# Patient Record
Sex: Female | Born: 1935 | Race: White | Hispanic: No | Marital: Single | Smoking: Never smoker
Health system: Southern US, Community
[De-identification: ages and names within clinical notes are randomized; demographics above are authoritative.]

## PROBLEM LIST (undated history)

## (undated) DIAGNOSIS — C50912 Malignant neoplasm of unspecified site of left female breast: Secondary | ICD-10-CM

## (undated) DIAGNOSIS — I890 Lymphedema, not elsewhere classified: Secondary | ICD-10-CM

## (undated) DIAGNOSIS — Z923 Personal history of irradiation: Secondary | ICD-10-CM

## (undated) DIAGNOSIS — G4730 Sleep apnea, unspecified: Secondary | ICD-10-CM

## (undated) DIAGNOSIS — F419 Anxiety disorder, unspecified: Secondary | ICD-10-CM

## (undated) DIAGNOSIS — C50919 Malignant neoplasm of unspecified site of unspecified female breast: Secondary | ICD-10-CM

## (undated) DIAGNOSIS — M1990 Unspecified osteoarthritis, unspecified site: Secondary | ICD-10-CM

## (undated) DIAGNOSIS — Z9221 Personal history of antineoplastic chemotherapy: Secondary | ICD-10-CM

## (undated) HISTORY — DX: Sleep apnea, unspecified: G47.30

## (undated) HISTORY — DX: Anxiety disorder, unspecified: F41.9

## (undated) HISTORY — DX: Lymphedema, not elsewhere classified: I89.0

## (undated) HISTORY — DX: Malignant neoplasm of unspecified site of unspecified female breast: C50.919

## (undated) HISTORY — PX: EYE SURGERY: SHX253

## (undated) HISTORY — DX: Unspecified osteoarthritis, unspecified site: M19.90

## (undated) HISTORY — DX: Malignant neoplasm of unspecified site of left female breast: C50.912

---
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
## 1978-01-05 HISTORY — PX: BREAST BIOPSY: SHX20

## 2000-01-06 DIAGNOSIS — Z9221 Personal history of antineoplastic chemotherapy: Secondary | ICD-10-CM

## 2000-01-06 DIAGNOSIS — Z923 Personal history of irradiation: Secondary | ICD-10-CM

## 2000-01-06 DIAGNOSIS — C50919 Malignant neoplasm of unspecified site of unspecified female breast: Secondary | ICD-10-CM

## 2000-01-06 HISTORY — PX: MASTECTOMY: SHX3

## 2000-01-06 HISTORY — DX: Personal history of antineoplastic chemotherapy: Z92.21

## 2000-01-06 HISTORY — DX: Personal history of irradiation: Z92.3

## 2000-01-06 HISTORY — DX: Malignant neoplasm of unspecified site of unspecified female breast: C50.919

## 2003-10-10 ENCOUNTER — Ambulatory Visit: Payer: Self-pay | Admitting: Internal Medicine

## 2003-11-22 ENCOUNTER — Ambulatory Visit: Payer: Self-pay | Admitting: Oncology

## 2003-12-06 ENCOUNTER — Ambulatory Visit: Payer: Self-pay | Admitting: Oncology

## 2004-01-24 ENCOUNTER — Ambulatory Visit: Payer: Self-pay | Admitting: Radiation Oncology

## 2004-02-29 ENCOUNTER — Ambulatory Visit: Payer: Self-pay | Admitting: Oncology

## 2004-03-05 ENCOUNTER — Ambulatory Visit: Payer: Self-pay | Admitting: Oncology

## 2004-04-21 ENCOUNTER — Ambulatory Visit: Payer: Self-pay | Admitting: Internal Medicine

## 2004-05-05 ENCOUNTER — Ambulatory Visit: Payer: Self-pay | Admitting: Internal Medicine

## 2004-06-05 ENCOUNTER — Ambulatory Visit: Payer: Self-pay | Admitting: Internal Medicine

## 2004-06-12 ENCOUNTER — Ambulatory Visit: Payer: Self-pay | Admitting: Oncology

## 2004-07-05 ENCOUNTER — Ambulatory Visit: Payer: Self-pay | Admitting: Internal Medicine

## 2004-07-05 ENCOUNTER — Ambulatory Visit: Payer: Self-pay | Admitting: Oncology

## 2004-08-25 ENCOUNTER — Ambulatory Visit: Payer: Self-pay | Admitting: General Surgery

## 2004-09-09 ENCOUNTER — Encounter: Payer: Self-pay | Admitting: Internal Medicine

## 2004-09-25 ENCOUNTER — Ambulatory Visit: Payer: Self-pay | Admitting: Oncology

## 2004-10-07 ENCOUNTER — Encounter: Payer: Self-pay | Admitting: Internal Medicine

## 2004-10-09 ENCOUNTER — Ambulatory Visit: Payer: Self-pay | Admitting: Oncology

## 2004-11-05 ENCOUNTER — Encounter: Payer: Self-pay | Admitting: Internal Medicine

## 2004-11-05 ENCOUNTER — Ambulatory Visit: Payer: Self-pay | Admitting: Oncology

## 2004-12-05 ENCOUNTER — Encounter: Payer: Self-pay | Admitting: Internal Medicine

## 2005-01-05 ENCOUNTER — Encounter: Payer: Self-pay | Admitting: Internal Medicine

## 2005-01-22 ENCOUNTER — Ambulatory Visit: Payer: Self-pay | Admitting: Radiation Oncology

## 2005-02-06 ENCOUNTER — Ambulatory Visit: Payer: Self-pay | Admitting: Oncology

## 2005-02-10 ENCOUNTER — Encounter: Payer: Self-pay | Admitting: Internal Medicine

## 2005-03-05 ENCOUNTER — Ambulatory Visit: Payer: Self-pay | Admitting: Oncology

## 2005-03-05 ENCOUNTER — Encounter: Payer: Self-pay | Admitting: Internal Medicine

## 2005-04-05 ENCOUNTER — Encounter: Payer: Self-pay | Admitting: Internal Medicine

## 2005-05-05 ENCOUNTER — Encounter: Payer: Self-pay | Admitting: Internal Medicine

## 2005-06-05 ENCOUNTER — Ambulatory Visit: Payer: Self-pay | Admitting: Oncology

## 2005-06-05 ENCOUNTER — Encounter: Payer: Self-pay | Admitting: Internal Medicine

## 2005-07-05 ENCOUNTER — Ambulatory Visit: Payer: Self-pay | Admitting: Oncology

## 2005-07-05 ENCOUNTER — Encounter: Payer: Self-pay | Admitting: Internal Medicine

## 2005-08-05 ENCOUNTER — Encounter: Payer: Self-pay | Admitting: Internal Medicine

## 2005-09-08 ENCOUNTER — Encounter: Payer: Self-pay | Admitting: Internal Medicine

## 2005-09-18 ENCOUNTER — Ambulatory Visit: Payer: Self-pay | Admitting: General Surgery

## 2005-10-02 ENCOUNTER — Ambulatory Visit: Payer: Self-pay | Admitting: Oncology

## 2005-10-05 ENCOUNTER — Ambulatory Visit: Payer: Self-pay | Admitting: Oncology

## 2005-10-05 ENCOUNTER — Encounter: Payer: Self-pay | Admitting: Internal Medicine

## 2005-11-05 ENCOUNTER — Encounter: Payer: Self-pay | Admitting: Internal Medicine

## 2005-12-05 ENCOUNTER — Encounter: Payer: Self-pay | Admitting: Internal Medicine

## 2006-01-05 ENCOUNTER — Encounter: Payer: Self-pay | Admitting: Internal Medicine

## 2006-02-05 ENCOUNTER — Encounter: Payer: Self-pay | Admitting: Internal Medicine

## 2006-03-06 ENCOUNTER — Encounter: Payer: Self-pay | Admitting: Internal Medicine

## 2006-04-01 ENCOUNTER — Ambulatory Visit: Payer: Self-pay | Admitting: Oncology

## 2006-04-06 ENCOUNTER — Ambulatory Visit: Payer: Self-pay | Admitting: Oncology

## 2006-10-06 ENCOUNTER — Ambulatory Visit: Payer: Self-pay | Admitting: Oncology

## 2006-10-11 ENCOUNTER — Ambulatory Visit: Payer: Self-pay | Admitting: Oncology

## 2006-10-14 ENCOUNTER — Ambulatory Visit: Payer: Self-pay | Admitting: Oncology

## 2006-11-06 ENCOUNTER — Ambulatory Visit: Payer: Self-pay | Admitting: Oncology

## 2007-01-06 ENCOUNTER — Ambulatory Visit: Payer: Self-pay | Admitting: Oncology

## 2007-04-06 ENCOUNTER — Ambulatory Visit: Payer: Self-pay | Admitting: Oncology

## 2007-04-14 ENCOUNTER — Ambulatory Visit: Payer: Self-pay | Admitting: Internal Medicine

## 2007-04-29 ENCOUNTER — Encounter: Payer: Self-pay | Admitting: Oncology

## 2007-05-06 ENCOUNTER — Encounter: Payer: Self-pay | Admitting: Oncology

## 2007-05-06 ENCOUNTER — Ambulatory Visit: Payer: Self-pay | Admitting: Oncology

## 2007-05-06 ENCOUNTER — Ambulatory Visit: Payer: Self-pay | Admitting: Internal Medicine

## 2007-06-06 ENCOUNTER — Ambulatory Visit: Payer: Self-pay | Admitting: Oncology

## 2007-06-06 ENCOUNTER — Encounter: Payer: Self-pay | Admitting: Oncology

## 2007-10-21 ENCOUNTER — Ambulatory Visit: Payer: Self-pay | Admitting: Oncology

## 2007-11-06 ENCOUNTER — Ambulatory Visit: Payer: Self-pay | Admitting: Oncology

## 2007-11-10 ENCOUNTER — Ambulatory Visit: Payer: Self-pay | Admitting: Oncology

## 2007-12-06 ENCOUNTER — Ambulatory Visit: Payer: Self-pay | Admitting: Oncology

## 2008-05-05 ENCOUNTER — Ambulatory Visit: Payer: Self-pay | Admitting: Oncology

## 2008-05-10 ENCOUNTER — Ambulatory Visit: Payer: Self-pay | Admitting: Oncology

## 2008-06-05 ENCOUNTER — Ambulatory Visit: Payer: Self-pay | Admitting: Oncology

## 2008-10-23 ENCOUNTER — Ambulatory Visit: Payer: Self-pay | Admitting: Oncology

## 2008-11-05 ENCOUNTER — Ambulatory Visit: Payer: Self-pay | Admitting: Oncology

## 2008-11-08 ENCOUNTER — Ambulatory Visit: Payer: Self-pay | Admitting: Internal Medicine

## 2008-12-05 ENCOUNTER — Ambulatory Visit: Payer: Self-pay | Admitting: Internal Medicine

## 2009-05-05 ENCOUNTER — Ambulatory Visit: Payer: Self-pay | Admitting: Oncology

## 2009-05-09 ENCOUNTER — Ambulatory Visit: Payer: Self-pay | Admitting: Oncology

## 2009-06-05 ENCOUNTER — Ambulatory Visit: Payer: Self-pay | Admitting: Oncology

## 2009-10-24 ENCOUNTER — Ambulatory Visit: Payer: Self-pay | Admitting: Oncology

## 2009-11-12 ENCOUNTER — Ambulatory Visit: Payer: Self-pay | Admitting: Internal Medicine

## 2009-12-05 ENCOUNTER — Ambulatory Visit: Payer: Self-pay | Admitting: Internal Medicine

## 2010-05-13 ENCOUNTER — Ambulatory Visit: Payer: Self-pay | Admitting: Internal Medicine

## 2010-05-14 LAB — CANCER ANTIGEN 27.29: CA 27.29: 21.2 U/mL (ref 0.0–38.6)

## 2010-06-06 ENCOUNTER — Ambulatory Visit: Payer: Self-pay | Admitting: Internal Medicine

## 2010-10-30 ENCOUNTER — Ambulatory Visit: Payer: Self-pay | Admitting: Oncology

## 2010-12-02 ENCOUNTER — Ambulatory Visit: Payer: Self-pay | Admitting: Internal Medicine

## 2010-12-06 ENCOUNTER — Ambulatory Visit: Payer: Self-pay | Admitting: Internal Medicine

## 2011-01-08 ENCOUNTER — Ambulatory Visit: Payer: Self-pay | Admitting: Ophthalmology

## 2011-01-08 DIAGNOSIS — I10 Essential (primary) hypertension: Secondary | ICD-10-CM

## 2011-01-08 LAB — POTASSIUM: Potassium: 3.9 mmol/L (ref 3.5–5.1)

## 2011-01-13 ENCOUNTER — Ambulatory Visit: Payer: Self-pay | Admitting: Ophthalmology

## 2011-03-04 ENCOUNTER — Ambulatory Visit: Payer: Self-pay | Admitting: Ophthalmology

## 2011-03-04 LAB — POTASSIUM: Potassium: 4.6 mmol/L (ref 3.5–5.1)

## 2011-03-11 ENCOUNTER — Ambulatory Visit: Payer: Self-pay | Admitting: Ophthalmology

## 2011-03-17 ENCOUNTER — Ambulatory Visit: Payer: Self-pay | Admitting: Internal Medicine

## 2011-06-03 ENCOUNTER — Ambulatory Visit: Payer: Self-pay | Admitting: Oncology

## 2011-06-03 LAB — CBC CANCER CENTER
Basophil #: 0 x10 3/mm (ref 0.0–0.1)
Basophil %: 1 %
Eosinophil #: 0.1 x10 3/mm (ref 0.0–0.7)
Eosinophil %: 3 %
HCT: 36.9 % (ref 35.0–47.0)
Lymphocyte #: 1.5 x10 3/mm (ref 1.0–3.6)
MCV: 97 fL (ref 80–100)
Monocyte #: 0.4 x10 3/mm (ref 0.2–0.9)
Neutrophil #: 2.5 x10 3/mm (ref 1.4–6.5)
Neutrophil %: 54.4 %
Platelet: 219 x10 3/mm (ref 150–440)
RDW: 13.4 % (ref 11.5–14.5)

## 2011-06-03 LAB — COMPREHENSIVE METABOLIC PANEL
Albumin: 3.5 g/dL (ref 3.4–5.0)
BUN: 19 mg/dL — ABNORMAL HIGH (ref 7–18)
Calcium, Total: 9.2 mg/dL (ref 8.5–10.1)
Chloride: 101 mmol/L (ref 98–107)
Creatinine: 0.94 mg/dL (ref 0.60–1.30)
EGFR (African American): >60
Glucose: 187 mg/dL — ABNORMAL HIGH (ref 65–99)
Osmolality: 281 (ref 275–301)
Potassium: 4.8 mmol/L (ref 3.5–5.1)
SGOT(AST): 19 U/L (ref 15–37)
Total Protein: 7 g/dL (ref 6.4–8.2)

## 2011-06-04 LAB — CANCER ANTIGEN 27.29: CA 27.29: 17.8 U/mL (ref 0.0–38.6)

## 2011-06-06 ENCOUNTER — Ambulatory Visit: Payer: Self-pay | Admitting: Oncology

## 2011-07-06 ENCOUNTER — Ambulatory Visit: Payer: Self-pay | Admitting: Oncology

## 2011-12-08 ENCOUNTER — Ambulatory Visit: Payer: Self-pay | Admitting: Internal Medicine

## 2011-12-15 ENCOUNTER — Ambulatory Visit: Payer: Self-pay | Admitting: Oncology

## 2012-01-06 ENCOUNTER — Ambulatory Visit: Payer: Self-pay | Admitting: Internal Medicine

## 2012-06-14 ENCOUNTER — Ambulatory Visit: Payer: Self-pay | Admitting: Oncology

## 2012-06-16 LAB — CBC CANCER CENTER
Basophil #: 0.1 x10 3/mm (ref 0.0–0.1)
Eosinophil %: 2.6 %
HGB: 12.7 g/dL (ref 12.0–16.0)
Lymphocyte %: 34.1 %
MCH: 32.3 pg (ref 26.0–34.0)
MCV: 94 fL (ref 80–100)
Monocyte #: 0.6 x10 3/mm (ref 0.2–0.9)
Monocyte %: 10 %
Neutrophil %: 52.2 %
RBC: 3.94 10*6/uL (ref 3.80–5.20)
RDW: 13.4 % (ref 11.5–14.5)

## 2012-06-16 LAB — COMPREHENSIVE METABOLIC PANEL
Albumin: 3.6 g/dL (ref 3.4–5.0)
Alkaline Phosphatase: 95 U/L (ref 50–136)
Anion Gap: 6 — ABNORMAL LOW (ref 7–16)
BUN: 19 mg/dL — ABNORMAL HIGH (ref 7–18)
Chloride: 99 mmol/L (ref 98–107)
Co2: 27 mmol/L (ref 21–32)
Creatinine: 1.05 mg/dL (ref 0.60–1.30)
EGFR (Non-African Amer.): 51 — ABNORMAL LOW
Glucose: 108 mg/dL — ABNORMAL HIGH (ref 65–99)
SGOT(AST): 16 U/L (ref 15–37)
SGPT (ALT): 23 U/L (ref 12–78)
Sodium: 132 mmol/L — ABNORMAL LOW (ref 136–145)
Total Protein: 7.3 g/dL (ref 6.4–8.2)

## 2012-06-17 LAB — CANCER ANTIGEN 27.29: CA 27.29: 21.3 U/mL (ref 0.0–38.6)

## 2012-07-05 ENCOUNTER — Ambulatory Visit: Payer: Self-pay | Admitting: Oncology

## 2012-11-10 ENCOUNTER — Ambulatory Visit: Payer: Self-pay | Admitting: Physical Medicine and Rehabilitation

## 2013-01-30 ENCOUNTER — Ambulatory Visit: Payer: Self-pay | Admitting: Oncology

## 2013-01-30 ENCOUNTER — Encounter: Payer: Self-pay | Admitting: Physical Medicine and Rehabilitation

## 2013-01-30 LAB — CBC CANCER CENTER
BASOS ABS: 0.1 x10 3/mm (ref 0.0–0.1)
Basophil %: 0.6 %
Eosinophil #: 0 x10 3/mm (ref 0.0–0.7)
Eosinophil %: 0.1 %
HCT: 37.5 % (ref 35.0–47.0)
HGB: 12.3 g/dL (ref 12.0–16.0)
Lymphocyte #: 1.5 x10 3/mm (ref 1.0–3.6)
Lymphocyte %: 10.2 %
MCH: 30.4 pg (ref 26.0–34.0)
MCHC: 32.8 g/dL (ref 32.0–36.0)
MCV: 93 fL (ref 80–100)
Monocyte #: 1 x10 3/mm — ABNORMAL HIGH (ref 0.2–0.9)
Monocyte %: 6.9 %
NEUTROS ABS: 11.7 x10 3/mm — AB (ref 1.4–6.5)
Neutrophil %: 82.2 %
Platelet: 190 x10 3/mm (ref 150–440)
RBC: 4.05 10*6/uL (ref 3.80–5.20)
RDW: 13.7 % (ref 11.5–14.5)
WBC: 14.2 x10 3/mm — ABNORMAL HIGH (ref 3.6–11.0)

## 2013-01-30 LAB — COMPREHENSIVE METABOLIC PANEL
ALT: 18 U/L (ref 12–78)
AST: 15 U/L (ref 15–37)
Albumin: 3.1 g/dL — ABNORMAL LOW (ref 3.4–5.0)
Alkaline Phosphatase: 86 U/L
Anion Gap: 9 (ref 7–16)
BILIRUBIN TOTAL: 0.6 mg/dL (ref 0.2–1.0)
BUN: 16 mg/dL (ref 7–18)
CALCIUM: 8.4 mg/dL — AB (ref 8.5–10.1)
Chloride: 94 mmol/L — ABNORMAL LOW (ref 98–107)
Co2: 26 mmol/L (ref 21–32)
Creatinine: 0.96 mg/dL (ref 0.60–1.30)
EGFR (Non-African Amer.): 57 — ABNORMAL LOW
Glucose: 150 mg/dL — ABNORMAL HIGH (ref 65–99)
OSMOLALITY: 263 (ref 275–301)
POTASSIUM: 4.3 mmol/L (ref 3.5–5.1)
Sodium: 129 mmol/L — ABNORMAL LOW (ref 136–145)
TOTAL PROTEIN: 7.3 g/dL (ref 6.4–8.2)

## 2013-01-31 ENCOUNTER — Ambulatory Visit: Payer: Self-pay | Admitting: Oncology

## 2013-02-04 LAB — CULTURE, BLOOD (SINGLE)

## 2013-02-05 ENCOUNTER — Encounter: Payer: Self-pay | Admitting: Physical Medicine and Rehabilitation

## 2013-02-05 ENCOUNTER — Ambulatory Visit: Payer: Self-pay | Admitting: Oncology

## 2013-02-17 ENCOUNTER — Ambulatory Visit: Payer: Self-pay | Admitting: Oncology

## 2013-03-05 ENCOUNTER — Encounter: Payer: Self-pay | Admitting: Physical Medicine and Rehabilitation

## 2013-03-05 ENCOUNTER — Ambulatory Visit: Payer: Self-pay | Admitting: Oncology

## 2013-04-05 ENCOUNTER — Ambulatory Visit: Payer: Self-pay | Admitting: Oncology

## 2013-04-24 LAB — COMPREHENSIVE METABOLIC PANEL
Albumin: 3.4 g/dL (ref 3.4–5.0)
Alkaline Phosphatase: 77 U/L
Anion Gap: 6 — ABNORMAL LOW (ref 7–16)
BUN: 16 mg/dL (ref 7–18)
Bilirubin,Total: 0.2 mg/dL (ref 0.2–1.0)
CO2: 30 mmol/L (ref 21–32)
Calcium, Total: 9.5 mg/dL (ref 8.5–10.1)
Chloride: 99 mmol/L (ref 98–107)
Creatinine: 0.87 mg/dL (ref 0.60–1.30)
EGFR (Non-African Amer.): >60
GLUCOSE: 103 mg/dL — AB (ref 65–99)
Osmolality: 272 (ref 275–301)
Potassium: 4.5 mmol/L (ref 3.5–5.1)
SGOT(AST): 15 U/L (ref 15–37)
SGPT (ALT): 21 U/L (ref 12–78)
SODIUM: 135 mmol/L — AB (ref 136–145)
TOTAL PROTEIN: 7.3 g/dL (ref 6.4–8.2)

## 2013-04-24 LAB — CBC CANCER CENTER
Basophil #: 0.1 x10 3/mm (ref 0.0–0.1)
Basophil %: 0.8 %
EOS ABS: 0.1 x10 3/mm (ref 0.0–0.7)
Eosinophil %: 1.7 %
HCT: 37.9 % (ref 35.0–47.0)
HGB: 12.4 g/dL (ref 12.0–16.0)
LYMPHS PCT: 25.5 %
Lymphocyte #: 1.5 x10 3/mm (ref 1.0–3.6)
MCH: 31.2 pg (ref 26.0–34.0)
MCHC: 32.6 g/dL (ref 32.0–36.0)
MCV: 96 fL (ref 80–100)
MONO ABS: 0.6 x10 3/mm (ref 0.2–0.9)
MONOS PCT: 10.4 %
NEUTROS PCT: 61.6 %
Neutrophil #: 3.7 x10 3/mm (ref 1.4–6.5)
Platelet: 247 x10 3/mm (ref 150–440)
RBC: 3.97 10*6/uL (ref 3.80–5.20)
RDW: 14 % (ref 11.5–14.5)
WBC: 6 x10 3/mm (ref 3.6–11.0)

## 2013-04-25 LAB — CANCER ANTIGEN 27.29: CA 27.29: 20.3 U/mL (ref 0.0–38.6)

## 2013-05-01 ENCOUNTER — Encounter: Payer: Self-pay | Admitting: Oncology

## 2013-05-05 ENCOUNTER — Ambulatory Visit: Payer: Self-pay | Admitting: Oncology

## 2013-05-05 ENCOUNTER — Encounter: Payer: Self-pay | Admitting: Oncology

## 2013-05-22 DIAGNOSIS — M5126 Other intervertebral disc displacement, lumbar region: Secondary | ICD-10-CM | POA: Insufficient documentation

## 2013-05-22 DIAGNOSIS — M5416 Radiculopathy, lumbar region: Secondary | ICD-10-CM | POA: Insufficient documentation

## 2013-05-22 DIAGNOSIS — M48062 Spinal stenosis, lumbar region with neurogenic claudication: Secondary | ICD-10-CM | POA: Insufficient documentation

## 2013-05-22 DIAGNOSIS — M62830 Muscle spasm of back: Secondary | ICD-10-CM | POA: Insufficient documentation

## 2013-05-22 LAB — COMPREHENSIVE METABOLIC PANEL
ALBUMIN: 3.4 g/dL (ref 3.4–5.0)
ALT: 16 U/L (ref 12–78)
Alkaline Phosphatase: 59 U/L
Anion Gap: 8 (ref 7–16)
BUN: 15 mg/dL (ref 7–18)
Bilirubin,Total: 0.3 mg/dL (ref 0.2–1.0)
CHLORIDE: 100 mmol/L (ref 98–107)
Calcium, Total: 8.7 mg/dL (ref 8.5–10.1)
Co2: 26 mmol/L (ref 21–32)
Creatinine: 0.8 mg/dL (ref 0.60–1.30)
EGFR (African American): >60
EGFR (Non-African Amer.): >60
Glucose: 110 mg/dL — ABNORMAL HIGH (ref 65–99)
Osmolality: 270 (ref 275–301)
Potassium: 4.5 mmol/L (ref 3.5–5.1)
SGOT(AST): 18 U/L (ref 15–37)
SODIUM: 134 mmol/L — AB (ref 136–145)
TOTAL PROTEIN: 7 g/dL (ref 6.4–8.2)

## 2013-05-22 LAB — CBC CANCER CENTER
BASOS ABS: 0.1 x10 3/mm (ref 0.0–0.1)
Basophil %: 1.4 %
Eosinophil #: 0.1 x10 3/mm (ref 0.0–0.7)
Eosinophil %: 2.6 %
HCT: 35.6 % (ref 35.0–47.0)
HGB: 12.1 g/dL (ref 12.0–16.0)
Lymphocyte #: 1.6 x10 3/mm (ref 1.0–3.6)
Lymphocyte %: 34.5 %
MCH: 31.8 pg (ref 26.0–34.0)
MCHC: 34 g/dL (ref 32.0–36.0)
MCV: 94 fL (ref 80–100)
MONO ABS: 0.6 x10 3/mm (ref 0.2–0.9)
MONOS PCT: 11.9 %
NEUTROS ABS: 2.3 x10 3/mm (ref 1.4–6.5)
Neutrophil %: 49.6 %
PLATELETS: 229 x10 3/mm (ref 150–440)
RBC: 3.81 10*6/uL (ref 3.80–5.20)
RDW: 13.7 % (ref 11.5–14.5)
WBC: 4.7 x10 3/mm (ref 3.6–11.0)

## 2013-06-05 ENCOUNTER — Ambulatory Visit: Payer: Self-pay | Admitting: Oncology

## 2013-06-05 ENCOUNTER — Encounter: Payer: Self-pay | Admitting: Oncology

## 2013-07-05 ENCOUNTER — Encounter: Payer: Self-pay | Admitting: Oncology

## 2013-07-08 DIAGNOSIS — E782 Mixed hyperlipidemia: Secondary | ICD-10-CM | POA: Insufficient documentation

## 2013-07-08 DIAGNOSIS — E119 Type 2 diabetes mellitus without complications: Secondary | ICD-10-CM | POA: Insufficient documentation

## 2013-07-08 DIAGNOSIS — N183 Chronic kidney disease, stage 3 (moderate): Secondary | ICD-10-CM

## 2013-07-08 DIAGNOSIS — E1122 Type 2 diabetes mellitus with diabetic chronic kidney disease: Secondary | ICD-10-CM | POA: Insufficient documentation

## 2013-07-08 DIAGNOSIS — I10 Essential (primary) hypertension: Secondary | ICD-10-CM | POA: Insufficient documentation

## 2013-07-08 DIAGNOSIS — D510 Vitamin B12 deficiency anemia due to intrinsic factor deficiency: Secondary | ICD-10-CM | POA: Insufficient documentation

## 2013-07-08 DIAGNOSIS — R251 Tremor, unspecified: Secondary | ICD-10-CM | POA: Insufficient documentation

## 2013-07-08 DIAGNOSIS — M810 Age-related osteoporosis without current pathological fracture: Secondary | ICD-10-CM | POA: Insufficient documentation

## 2013-09-05 DIAGNOSIS — M7542 Impingement syndrome of left shoulder: Secondary | ICD-10-CM | POA: Insufficient documentation

## 2013-09-05 DIAGNOSIS — M7540 Impingement syndrome of unspecified shoulder: Secondary | ICD-10-CM | POA: Insufficient documentation

## 2013-09-19 ENCOUNTER — Ambulatory Visit: Payer: Self-pay | Admitting: Gastroenterology

## 2013-09-22 LAB — PATHOLOGY REPORT

## 2013-10-11 DIAGNOSIS — K52832 Lymphocytic colitis: Secondary | ICD-10-CM | POA: Insufficient documentation

## 2013-10-24 DIAGNOSIS — M170 Bilateral primary osteoarthritis of knee: Secondary | ICD-10-CM | POA: Insufficient documentation

## 2013-11-02 ENCOUNTER — Ambulatory Visit: Payer: Self-pay | Admitting: Oncology

## 2013-11-05 ENCOUNTER — Ambulatory Visit: Payer: Self-pay | Admitting: Oncology

## 2013-11-20 LAB — CBC CANCER CENTER
BASOS PCT: 1 %
Basophil #: 0.1 x10 3/mm (ref 0.0–0.1)
EOS ABS: 0.2 x10 3/mm (ref 0.0–0.7)
Eosinophil %: 3.2 %
HCT: 37.1 % (ref 35.0–47.0)
HGB: 12.2 g/dL (ref 12.0–16.0)
Lymphocyte #: 1.6 x10 3/mm (ref 1.0–3.6)
Lymphocyte %: 28.8 %
MCH: 31.6 pg (ref 26.0–34.0)
MCHC: 32.9 g/dL (ref 32.0–36.0)
MCV: 96 fL (ref 80–100)
MONO ABS: 0.5 x10 3/mm (ref 0.2–0.9)
Monocyte %: 8.3 %
NEUTROS PCT: 58.7 %
Neutrophil #: 3.3 x10 3/mm (ref 1.4–6.5)
Platelet: 265 x10 3/mm (ref 150–440)
RBC: 3.86 10*6/uL (ref 3.80–5.20)
RDW: 13.5 % (ref 11.5–14.5)
WBC: 5.6 x10 3/mm (ref 3.6–11.0)

## 2013-11-20 LAB — COMPREHENSIVE METABOLIC PANEL
ALBUMIN: 3.4 g/dL (ref 3.4–5.0)
AST: 17 U/L (ref 15–37)
Alkaline Phosphatase: 84 U/L
Anion Gap: 11 (ref 7–16)
BUN: 18 mg/dL (ref 7–18)
Bilirubin,Total: 0.3 mg/dL (ref 0.2–1.0)
CALCIUM: 9.2 mg/dL (ref 8.5–10.1)
CO2: 25 mmol/L (ref 21–32)
Chloride: 100 mmol/L (ref 98–107)
Creatinine: 1.14 mg/dL (ref 0.60–1.30)
EGFR (African American): 59 — ABNORMAL LOW
EGFR (Non-African Amer.): 49 — ABNORMAL LOW
GLUCOSE: 189 mg/dL — AB (ref 65–99)
Osmolality: 279 (ref 275–301)
Potassium: 4.1 mmol/L (ref 3.5–5.1)
SGPT (ALT): 21 U/L
SODIUM: 136 mmol/L (ref 136–145)
TOTAL PROTEIN: 7.2 g/dL (ref 6.4–8.2)

## 2013-11-22 LAB — CANCER ANTIGEN 27.29: CA 27.29: 17.5 U/mL (ref 0.0–38.6)

## 2013-12-05 ENCOUNTER — Ambulatory Visit: Payer: Self-pay | Admitting: Oncology

## 2014-01-02 ENCOUNTER — Encounter: Payer: Self-pay | Admitting: Physical Medicine and Rehabilitation

## 2014-01-05 ENCOUNTER — Encounter: Payer: Self-pay | Admitting: Physical Medicine and Rehabilitation

## 2014-02-05 ENCOUNTER — Encounter: Payer: Self-pay | Admitting: Physical Medicine and Rehabilitation

## 2014-03-06 ENCOUNTER — Encounter
Admit: 2014-03-06 | Disposition: A | Discharge status: Home / Self Care | Payer: Self-pay | Attending: Physical Medicine and Rehabilitation | Admitting: Physical Medicine and Rehabilitation

## 2014-03-20 DIAGNOSIS — Z6835 Body mass index (BMI) 35.0-35.9, adult: Secondary | ICD-10-CM

## 2014-03-20 DIAGNOSIS — IMO0002 Reserved for concepts with insufficient information to code with codable children: Secondary | ICD-10-CM | POA: Insufficient documentation

## 2014-04-25 ENCOUNTER — Ambulatory Visit
Admit: 2014-04-25 | Disposition: A | Discharge status: Home / Self Care | Payer: Self-pay | Attending: Oncology | Admitting: Oncology

## 2014-04-29 NOTE — Op Note (Signed)
PATIENT NAME:  Janet Elliott, Janet Elliott MR#:  [id number] DATE OF BIRTH:  [birth year]  DATE OF PROCEDURE:  01/13/2011  PREOPERATIVE DIAGNOSIS: Visually significant cataract of the left eye.   POSTOPERATIVE DIAGNOSIS: Visually significant cataract of the left eye.   OPERATIVE PROCEDURE: Cataract extraction by phacoemulsification with implant of intraocular lens to left eye.   SURGEON: Birder Robson, MD.   ANESTHESIA:  1. Managed anesthesia care.  2. Topical tetracaine drops followed by 2% Xylocaine jelly applied in the preoperative holding area.   COMPLICATIONS: None.   TECHNIQUE:  Stop and chop.    DESCRIPTION OF PROCEDURE: The patient was examined and consented in the preoperative holding area where the aforementioned topical anesthesia was applied to the left eye and then brought back to the Operating Room where the left eye was prepped and draped in the usual sterile ophthalmic fashion and a lid speculum was placed. A paracentesis was created with the side port blade and the anterior chamber was filled with viscoelastic. A near clear corneal incision was performed with the steel keratome. A continuous curvilinear capsulorrhexis was performed with a cystotome followed by the capsulorrhexis forceps. Hydrodissection and hydrodelineation were carried out with BSS on a blunt cannula. The lens was removed in a stop and chop technique and the remaining cortical material was removed with the irrigation-aspiration handpiece. The capsular bag was inflated with viscoelastic and the Technis ZCB000 22.0-diopter lens, serial number [id number] was placed in the capsular bag without complication. The remaining viscoelastic was removed from the eye with the irrigation-aspiration handpiece. The wounds were hydrated. The anterior chamber was flushed with Miostat and the eye was inflated to physiologic pressure. The wounds were found to be water tight. The eye was dressed with Vigamox and Omnipred. The patient was given  protective glasses to wear throughout the day and a shield with which to sleep tonight. The patient was also given drops with which to begin a drop regimen today and will follow-up with me in one day.   ____________________________ Livingston Diones. Josselin Gaulin, MD wlp:ap D: 01/13/2011 13:49:29 ET T: 01/13/2011 14:03:14 ET JOB#: [id number]  cc: Tyden Kann L. Menaal Russum, MD, <Dictator> Livingston Diones Merced Hanners MD ELECTRONICALLY SIGNED 01/20/2011 12:16

## 2014-04-29 NOTE — Op Note (Signed)
PATIENT NAME:  Janet Elliott, Janet Elliott I MR#:  [id number] DATE OF BIRTH:  [birth year]  DATE OF PROCEDURE:  03/11/2011  PREOPERATIVE DIAGNOSIS: Visually significant cataract of the right eye.   POSTOPERATIVE DIAGNOSIS: Visually significant cataract of the right eye.   OPERATIVE PROCEDURE: Cataract extraction by phacoemulsification with implant of intraocular lens to right eye.   SURGEON: Birder Robson, MD.   ANESTHESIA:  1. Managed anesthesia care.  2. Topical tetracaine drops followed by 2% Xylocaine jelly applied in the preoperative holding area.   COMPLICATIONS: None.   TECHNIQUE: Stop and chop  DESCRIPTION OF PROCEDURE: The patient was examined and consented in the preoperative holding area where the aforementioned topical anesthesia was applied to the right eye and then brought back to the Operating Room where the right eye was prepped and draped in the usual sterile ophthalmic fashion and a lid speculum was placed. A paracentesis was created with the side port blade and the anterior chamber was filled with viscoelastic. A near clear corneal incision was performed with the steel keratome. A continuous curvilinear capsulorrhexis was performed with a cystotome followed by the capsulorrhexis forceps. Hydrodissection and hydrodelineation were carried out with BSS on a blunt cannula. The lens was removed in a stop and chop technique and the remaining cortical material was removed with the irrigation-aspiration handpiece. The capsular bag was inflated with viscoelastic and the Technis GCBOO 24.0-diopter lens, serial number [id number], was placed in the capsular bag without complication. The remaining viscoelastic was removed from the eye with the irrigation-aspiration handpiece. The wounds were hydrated. The anterior chamber was flushed with Miostat and the eye was inflated to physiologic pressure. The wounds were found to be water tight. The eye was dressed with Vigamox. The patient was given protective  glasses to wear throughout the day and a shield with which to sleep tonight. The patient was also given drops with which to begin a drop regimen today and will follow-up with me in one day.   ____________________________ Livingston Diones. Lashonna Rieke, MD wlp:cbb D: 03/11/2011 16:57:24 ET T: 03/11/2011 17:05:39 ET JOB#: [id number]  cc: Jezebel Pollet L. Derryl Uher, MD, <Dictator> Livingston Diones Iesha Summerhill MD ELECTRONICALLY SIGNED 03/12/2011 14:36

## 2014-05-18 ENCOUNTER — Other Ambulatory Visit: Payer: Self-pay | Admitting: *Deleted

## 2014-05-18 DIAGNOSIS — C50919 Malignant neoplasm of unspecified site of unspecified female breast: Secondary | ICD-10-CM

## 2014-05-21 ENCOUNTER — Inpatient Hospital Stay: Payer: Medicare Other | Attending: Oncology | Admitting: Oncology

## 2014-05-21 ENCOUNTER — Inpatient Hospital Stay: Payer: Medicare Other

## 2014-05-21 VITALS — BP 156/99 | HR 84 | Temp 96.1°F | Wt 188.1 lb

## 2014-05-21 DIAGNOSIS — Z79899 Other long term (current) drug therapy: Secondary | ICD-10-CM | POA: Diagnosis not present

## 2014-05-21 DIAGNOSIS — M549 Dorsalgia, unspecified: Secondary | ICD-10-CM | POA: Diagnosis not present

## 2014-05-21 DIAGNOSIS — C50919 Malignant neoplasm of unspecified site of unspecified female breast: Secondary | ICD-10-CM

## 2014-05-21 DIAGNOSIS — L0390 Cellulitis, unspecified: Secondary | ICD-10-CM | POA: Insufficient documentation

## 2014-05-21 DIAGNOSIS — I890 Lymphedema, not elsewhere classified: Secondary | ICD-10-CM | POA: Insufficient documentation

## 2014-05-21 DIAGNOSIS — M2550 Pain in unspecified joint: Secondary | ICD-10-CM | POA: Insufficient documentation

## 2014-05-21 DIAGNOSIS — M1990 Unspecified osteoarthritis, unspecified site: Secondary | ICD-10-CM | POA: Diagnosis not present

## 2014-05-21 DIAGNOSIS — Z923 Personal history of irradiation: Secondary | ICD-10-CM | POA: Diagnosis not present

## 2014-05-21 DIAGNOSIS — Z9223 Personal history of estrogen therapy: Secondary | ICD-10-CM | POA: Diagnosis not present

## 2014-05-21 DIAGNOSIS — Z853 Personal history of malignant neoplasm of breast: Secondary | ICD-10-CM | POA: Insufficient documentation

## 2014-05-21 DIAGNOSIS — Z9012 Acquired absence of left breast and nipple: Secondary | ICD-10-CM | POA: Diagnosis not present

## 2014-05-21 DIAGNOSIS — M129 Arthropathy, unspecified: Secondary | ICD-10-CM | POA: Diagnosis not present

## 2014-05-21 DIAGNOSIS — Z9221 Personal history of antineoplastic chemotherapy: Secondary | ICD-10-CM | POA: Diagnosis not present

## 2014-05-21 DIAGNOSIS — C50912 Malignant neoplasm of unspecified site of left female breast: Secondary | ICD-10-CM

## 2014-05-21 LAB — COMPREHENSIVE METABOLIC PANEL
ALBUMIN: 3.9 g/dL (ref 3.5–5.0)
ALT: 16 U/L (ref 14–54)
ANION GAP: 7 (ref 5–15)
AST: 22 U/L (ref 15–41)
Alkaline Phosphatase: 69 U/L (ref 38–126)
BUN: 17 mg/dL (ref 6–20)
CALCIUM: 8.9 mg/dL (ref 8.9–10.3)
CHLORIDE: 101 mmol/L (ref 101–111)
CO2: 25 mmol/L (ref 22–32)
Creatinine, Ser: 0.85 mg/dL (ref 0.44–1.00)
GFR calc Af Amer: >60 mL/min (ref >60)
GFR calc non Af Amer: >60 mL/min (ref >60)
Glucose, Bld: 209 mg/dL — ABNORMAL HIGH (ref 65–99)
Potassium: 3.8 mmol/L (ref 3.5–5.1)
SODIUM: 133 mmol/L — AB (ref 135–145)
TOTAL PROTEIN: 7.5 g/dL (ref 6.5–8.1)
Total Bilirubin: 0.4 mg/dL (ref 0.3–1.2)

## 2014-05-21 LAB — CBC WITH DIFFERENTIAL/PLATELET
BASOS PCT: 1 %
Basophils Absolute: 0 10*3/uL (ref 0–0.1)
EOS ABS: 0.1 10*3/uL (ref 0–0.7)
EOS PCT: 3 %
HEMATOCRIT: 38.2 % (ref 35.0–47.0)
Hemoglobin: 12.5 g/dL (ref 12.0–16.0)
LYMPHS PCT: 32 %
Lymphs Abs: 1.5 10*3/uL (ref 1.0–3.6)
MCH: 30.9 pg (ref 26.0–34.0)
MCHC: 32.8 g/dL (ref 32.0–36.0)
MCV: 94.1 fL (ref 80.0–100.0)
Monocytes Absolute: 0.4 10*3/uL (ref 0.2–0.9)
Monocytes Relative: 8 %
NEUTROS ABS: 2.7 10*3/uL (ref 1.4–6.5)
Neutrophils Relative %: 56 %
PLATELETS: 223 10*3/uL (ref 150–440)
RBC: 4.06 MIL/uL (ref 3.80–5.20)
RDW: 13.3 % (ref 11.5–14.5)
WBC: 4.8 10*3/uL (ref 3.6–11.0)

## 2014-05-22 LAB — CANCER ANTIGEN 27.29: CA 27.29: 15.5 U/mL (ref 0.0–38.6)

## 2014-05-26 ENCOUNTER — Encounter: Payer: Self-pay | Admitting: Oncology

## 2014-05-26 DIAGNOSIS — C50912 Malignant neoplasm of unspecified site of left female breast: Secondary | ICD-10-CM | POA: Insufficient documentation

## 2014-05-26 NOTE — Progress Notes (Signed)
[place] @ [facility] Telephone:(336) [phone]  Fax:(336) [place]: 1932-03-08  MR#: [id number]  [id number]#:[id number]  Patient Care Team: Kirk Ruths, MD as PCP - General (Internal Medicine)  CHIEF COMPLAINT:  Chief Complaint  Patient presents with  . Follow-up    Oncology History   1.  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  2.  Estrogen receptor positive more than 95%.  3.  Progesterone receptor positive more than 95%.  4.  HER-2/neu 1+. FISH study negative.  5.  Started on chemotherapy with Epirubicin and Taxotere in June of 2002 as neoadjuvant treatment.  6.  Surgery with mastectomy in September of 2002.  Residual carcinoma involving skin and nipple.  Seven out of fourteen lymph nodes are positive.  7.  Started on radiation therapy to the chest wall in November of 2002.  8.  Finished radiation therapy on December 31, 2000.  Received 5,040 cGy in 28 fractions.  9.Femara has been discontinued in December of 2012 9.  Arimidex 1 tablet daily completed Sept. 2007.  10. Extended adjuvant hormonal therapy with Letrozole initiated 10/07. 11. Antihormone therapy discontinued  12lymphedema of left upper extremity.  Recurrent cellulitis      Carcinoma of left breast   05/25/2000 Initial Diagnosis Carcinoma of left breast    No flowsheet data found.  INTERVAL HISTORY: 81 year old lady came today further follow-up regarding inflammatory carcinoma of breast.  Patient continues to a joint pains knee pain and back pain and difficulty in ambulation.  Left upper extremity lymphedema and recurrent cellulitis however patient is taking Augmentin at the first sign of redness and has prevented hospitalization.  Patient is getting physiotherapy and lymphedema therapy.  Here for further follow-up and treatment consideration . REVIEW OF SYSTEMS:   \General status: Ambulating with the help of walker.  Performance status is declining to 1.   HEENT: No headache no dizziness.  No soreness in the mouth. Lungs: Shortness of breath on exertion.  Cough.  No hemoptysis. Cardiac: No chest pain.  No palpitation. GI: No nausea.  No vomiting.  No diarrhea.  No rectal bleeding.  Musculoskeletal system: Osteoarthritis bony pain joint pains Neurological system: No headache no dizziness.  No other focal symptoms. Musculoskeletal system: Joint pains.  Swelling of the left upper extremity. Skin: No rash. Lower extremity no edema. All other systems have been reviewed  As per HPI. Otherwise, a complete review of systems is negatve.  PAST MEDICAL HISTORY: Past Medical History  Diagnosis Date  . Breast cancer   . Lymphedema   . Arthritis     osteoarthritis  . Anxiety   . Sleep apnea   . Carcinoma of left breast 05/26/2014    PAST SURGICAL HISTORY: Past Surgical History  Procedure Laterality Date  . Mastectomy Left 2002  . Eye surgery    . Breast biopsy  1980    FAMILY HISTORY Family History  Problem Relation Age of Onset  . Breast cancer Maternal Aunt   . Breast cancer Cousin   . Breast cancer Cousin     GYNECOLOGIC HISTORY:  No LMP recorded.     ADVANCED DIRECTIVES:  Patient does have advance Directive   HEALTH MAINTENANCE: History  Substance Use Topics  . Smoking status: Never Smoker   . Smokeless tobacco: Not on file  . Alcohol Use: No      Allergies  Allergen Reactions  . Codeine     Other reaction(s): Unknown  Current Outpatient Prescriptions  Medication Sig Dispense Refill  . Calcium Carbonate-Vitamin D 600-400 MG-UNIT per tablet Take 1 tablet by mouth daily.    . diclofenac sodium (VOLTAREN) 1 % GEL Apply 2 g topically 4 (four) times daily.    Marland Kitchen esomeprazole (NEXIUM) 20 MG capsule Take 20 mg by mouth daily.    . fenofibrate 160 MG tablet Take 160 mg by mouth daily.    . fexofenadine-pseudoephedrine (ALLEGRA-D 24) 180-240 MG per 24 hr tablet Take 1 tablet by mouth daily.    . furosemide (LASIX) 40  MG tablet Take 40 mg by mouth daily.    Marland Kitchen glimepiride (AMARYL) 4 MG tablet Take 4 mg by mouth daily with breakfast.    . losartan (COZAAR) 100 MG tablet Take 100 mg by mouth daily.    . magnesium oxide (MAG-OX) 400 MG tablet Take 400 mg by mouth daily.    Marland Kitchen PARoxetine (PAXIL-CR) 25 MG 24 hr tablet Take 25 mg by mouth daily.    . pioglitazone (ACTOS) 15 MG tablet Take 15 mg by mouth daily.    . pregabalin (LYRICA) 50 MG capsule Take 50 mg by mouth 2 (two) times daily.    . Probiotic Product (Kirvin) CAPS Take 1 capsule by mouth daily.    . saxagliptin HCl (ONGLYZA) 2.5 MG TABS tablet Take 2.5 mg by mouth daily.    . vitamin B-12 (CYANOCOBALAMIN) 250 MCG tablet Take 250 mcg by mouth daily.    . vitamin C (ASCORBIC ACID) 500 MG tablet Take 500 mg by mouth daily.    . Vitamin D, Ergocalciferol, (DRISDOL) 50000 UNITS CAPS capsule Take 50,000 Units by mouth every 7 (seven) days.    Marland Kitchen oxybutynin (DITROPAN) 5 MG tablet Take 5 mg by mouth once. As needed for bladder spasms - may take 1 tablet in addition if needed daily     No current facility-administered medications for this visit.    OBJECTIVE:  Filed Vitals:   05/21/14 1147  BP: 156/99  Pulse:   Temp:      Body mass index is 38.01 kg/(m^2).    ECOG FS:1 - Symptomatic but completely ambulatory  PHYSICAL EXAM: Goal status: Performance status is good.  Patient has not lost significant weight HEENT: No evidence of stomatitis.   Sclera and conjunctivae :: No jaundice.   pale looking . Lungs: Air  entry equal on both sides.  No rhonchi.  No rales.  Cardiac: Heart sounds are normal.  No pericardial rub.  No murmur. Lymphatic system: Cervical, axillary, inguinal, lymph nodes not palpable GI: Abdomen is soft.  No ascites.  Liver spleen not palpable.  No tenderness.  Bowel sounds are within normal limit Lower extremity: No edema.  Left upper extremity lymphedema unchanged. Neurological system: Higher functions, cranial nerves  intact No evidence of peripheral neuropathy. Skin: No rash.  No ecchymosis.. Breasts: Right breast free of masses.  Left chest wall area no evidence of recurrent disease. :   LAB RESULTS:  Appointment on 05/21/2014  Component Date Value Ref Range Status  . WBC 05/21/2014 4.8  3.6 - 11.0 K/uL Final  . RBC 05/21/2014 4.06  3.80 - 5.20 MIL/uL Final  . Hemoglobin 05/21/2014 12.5  12.0 - 16.0 g/dL Final  . HCT 05/21/2014 38.2  35.0 - 47.0 % Final  . MCV 05/21/2014 94.1  80.0 - 100.0 fL Final  . MCH 05/21/2014 30.9  26.0 - 34.0 pg Final  . MCHC 05/21/2014 32.8  32.0 - 36.0 g/dL  Final  . RDW 05/21/2014 13.3  11.5 - 14.5 % Final  . Platelets 05/21/2014 223  150 - 440 K/uL Final  . Neutrophils Relative % 05/21/2014 56   Final  . Neutro Abs 05/21/2014 2.7  1.4 - 6.5 K/uL Final  . Lymphocytes Relative 05/21/2014 32   Final  . Lymphs Abs 05/21/2014 1.5  1.0 - 3.6 K/uL Final  . Monocytes Relative 05/21/2014 8   Final  . Monocytes Absolute 05/21/2014 0.4  0.2 - 0.9 K/uL Final  . Eosinophils Relative 05/21/2014 3   Final  . Eosinophils Absolute 05/21/2014 0.1  0 - 0.7 K/uL Final  . Basophils Relative 05/21/2014 1   Final  . Basophils Absolute 05/21/2014 0.0  0 - 0.1 K/uL Final  . Sodium 05/21/2014 133* 135 - 145 mmol/L Final  . Potassium 05/21/2014 3.8  3.5 - 5.1 mmol/L Final  . Chloride 05/21/2014 101  101 - 111 mmol/L Final  . CO2 05/21/2014 25  22 - 32 mmol/L Final  . Glucose, Bld 05/21/2014 209* 65 - 99 mg/dL Final  . BUN 05/21/2014 17  6 - 20 mg/dL Final  . Creatinine, Ser 05/21/2014 0.85  0.44 - 1.00 mg/dL Final  . Calcium 05/21/2014 8.9  8.9 - 10.3 mg/dL Final  . Total Protein 05/21/2014 7.5  6.5 - 8.1 g/dL Final  . Albumin 05/21/2014 3.9  3.5 - 5.0 g/dL Final  . AST 05/21/2014 22  15 - 41 U/L Final  . ALT 05/21/2014 16  14 - 54 U/L Final  . Alkaline Phosphatase 05/21/2014 69  38 - 126 U/L Final  . Total Bilirubin 05/21/2014 0.4  0.3 - 1.2 mg/dL Final  . GFR calc non Af Amer  05/21/2014 >60  >60 mL/min Final  . GFR calc Af Amer 05/21/2014 >60  >60 mL/min Final   Comment: (NOTE) The eGFR has been calculated using the CKD EPI equation. This calculation has not been validated in all clinical situations. eGFR's persistently <60 mL/min signify possible Chronic Kidney Disease.   . Anion gap 05/21/2014 7  5 - 15 Final  . CA 27.29 05/21/2014 15.5  0.0 - 38.6 U/mL Final   Comment: (NOTE) [employer]ayer Centaur/ACS methodology Performed At: [facility] [place], [place] [id number] Lindon Romp MD QQ:[id number]     Lab Results  Component Value Date   LABCA2 15.5 05/21/2014     STUDIES: No results found.  ASSESSMENT: 81 year old lady with stage IV carcinoma breast on clinical ground there is no evidence of recurrent disease Recurrent cellulitis with lymphedema in left upper extremity Osteoarthritis and declining performance status  MEDICAL DECISION MAKING:  All lab data has been reviewed. There is no evidence of recurrent disease Lymphedema left upper extremity with recurrent cellulitis unchanged Schedule for mammogram Continue follow-up  Patient expressed understanding and was in agreement with this plan. She also understands that She can call clinic at any time with any questions, concerns, or complaints.    Carcinoma of left breast   Staging form: Breast, AJCC 7th Edition     Clinical: Stage IIIB (T4d, N1, M0) - Unsigned   Forest Gleason, MD   05/26/2014 11:22 AM

## 2014-11-20 ENCOUNTER — Inpatient Hospital Stay: Payer: Medicare Other

## 2014-11-20 ENCOUNTER — Encounter: Payer: Self-pay | Admitting: Oncology

## 2014-11-20 ENCOUNTER — Inpatient Hospital Stay: Payer: Medicare Other | Attending: Oncology | Admitting: Oncology

## 2014-11-20 VITALS — BP 189/97 | HR 79 | Temp 97.5°F | Wt 188.1 lb

## 2014-11-20 DIAGNOSIS — Z9013 Acquired absence of bilateral breasts and nipples: Secondary | ICD-10-CM | POA: Diagnosis not present

## 2014-11-20 DIAGNOSIS — Z9181 History of falling: Secondary | ICD-10-CM

## 2014-11-20 DIAGNOSIS — Z79899 Other long term (current) drug therapy: Secondary | ICD-10-CM | POA: Diagnosis not present

## 2014-11-20 DIAGNOSIS — M2550 Pain in unspecified joint: Secondary | ICD-10-CM

## 2014-11-20 DIAGNOSIS — R32 Unspecified urinary incontinence: Secondary | ICD-10-CM

## 2014-11-20 DIAGNOSIS — G4730 Sleep apnea, unspecified: Secondary | ICD-10-CM | POA: Diagnosis not present

## 2014-11-20 DIAGNOSIS — Z803 Family history of malignant neoplasm of breast: Secondary | ICD-10-CM

## 2014-11-20 DIAGNOSIS — F419 Anxiety disorder, unspecified: Secondary | ICD-10-CM | POA: Diagnosis not present

## 2014-11-20 DIAGNOSIS — M545 Low back pain: Secondary | ICD-10-CM | POA: Diagnosis not present

## 2014-11-20 DIAGNOSIS — Z923 Personal history of irradiation: Secondary | ICD-10-CM | POA: Diagnosis not present

## 2014-11-20 DIAGNOSIS — C50919 Malignant neoplasm of unspecified site of unspecified female breast: Secondary | ICD-10-CM

## 2014-11-20 DIAGNOSIS — Z853 Personal history of malignant neoplasm of breast: Secondary | ICD-10-CM | POA: Diagnosis not present

## 2014-11-20 DIAGNOSIS — I890 Lymphedema, not elsewhere classified: Secondary | ICD-10-CM | POA: Diagnosis not present

## 2014-11-20 DIAGNOSIS — Z9221 Personal history of antineoplastic chemotherapy: Secondary | ICD-10-CM | POA: Diagnosis not present

## 2014-11-20 DIAGNOSIS — Z993 Dependence on wheelchair: Secondary | ICD-10-CM | POA: Diagnosis not present

## 2014-11-20 DIAGNOSIS — C50912 Malignant neoplasm of unspecified site of left female breast: Secondary | ICD-10-CM

## 2014-11-20 LAB — COMPREHENSIVE METABOLIC PANEL
ALK PHOS: 71 U/L (ref 38–126)
ALT: 16 U/L (ref 14–54)
ANION GAP: 8 (ref 5–15)
AST: 24 U/L (ref 15–41)
Albumin: 3.7 g/dL (ref 3.5–5.0)
BUN: 17 mg/dL (ref 6–20)
CALCIUM: 9 mg/dL (ref 8.9–10.3)
CHLORIDE: 100 mmol/L — AB (ref 101–111)
CO2: 23 mmol/L (ref 22–32)
CREATININE: 0.82 mg/dL (ref 0.44–1.00)
Glucose, Bld: 185 mg/dL — ABNORMAL HIGH (ref 65–99)
Potassium: 4.2 mmol/L (ref 3.5–5.1)
Sodium: 131 mmol/L — ABNORMAL LOW (ref 135–145)
Total Bilirubin: 0.8 mg/dL (ref 0.3–1.2)
Total Protein: 7 g/dL (ref 6.5–8.1)

## 2014-11-20 LAB — CBC WITH DIFFERENTIAL/PLATELET
Basophils Absolute: 0.1 10*3/uL (ref 0–0.1)
Basophils Relative: 1 %
Eosinophils Absolute: 0.2 10*3/uL (ref 0–0.7)
Eosinophils Relative: 3 %
HCT: 38.8 % (ref 35.0–47.0)
HEMOGLOBIN: 13 g/dL (ref 12.0–16.0)
LYMPHS ABS: 1.9 10*3/uL (ref 1.0–3.6)
Lymphocytes Relative: 34 %
MCH: 31.3 pg (ref 26.0–34.0)
MCHC: 33.5 g/dL (ref 32.0–36.0)
MCV: 93.2 fL (ref 80.0–100.0)
MONO ABS: 0.5 10*3/uL (ref 0.2–0.9)
MONOS PCT: 10 %
NEUTROS ABS: 2.9 10*3/uL (ref 1.4–6.5)
NEUTROS PCT: 52 %
Platelets: 192 10*3/uL (ref 150–440)
RBC: 4.16 MIL/uL (ref 3.80–5.20)
RDW: 13.1 % (ref 11.5–14.5)
WBC: 5.5 10*3/uL (ref 3.6–11.0)

## 2014-11-20 MED ORDER — AMOXICILLIN-POT CLAVULANATE 875-125 MG PO TABS: 1.0000 | ORAL_TABLET | Freq: Two times a day (BID) | ORAL | Status: DC

## 2014-11-20 NOTE — Progress Notes (Signed)
[place] @ [facility] Telephone:(336) [phone]  Fax:(336) [place]: 05/12/1932  MR#: [id number]  VVO#:[id number]  Patient Care Team: Kirk Ruths, MD as PCP - General (Internal Medicine)  CHIEF COMPLAINT:  Chief Complaint  Patient presents with  . OTHER    Oncology History   1.  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  2.  Estrogen receptor positive more than 95%.  3.  Progesterone receptor positive more than 95%.  4.  HER-2/neu 1+. FISH study negative.  5.  Started on chemotherapy with Epirubicin and Taxotere in June of 2002 as neoadjuvant treatment.  6.  Surgery with mastectomy in September of 2002.  Residual carcinoma involving skin and nipple.  Seven out of fourteen lymph nodes are positive.  7.  Started on radiation therapy to the chest wall in November of 2002.  8.  Finished radiation therapy on December 31, 2000.  Received 5,040 cGy in 28 fractions.  9.Femara has been discontinued in December of 2012 9.  Arimidex 1 tablet daily completed Sept. 2007.  10. Extended adjuvant hormonal therapy with Letrozole initiated 10/07. 11. Antihormone therapy discontinued  12lymphedema of left upper extremity.  Recurrent cellulitis      Carcinoma of left breast ([place])   05/25/2000 Initial Diagnosis Carcinoma of left breast    No flowsheet data found.  INTERVAL HISTORY: 81 year old lady came today further follow-up regarding inflammatory carcinoma of breast.  Patient continues to a joint pains knee pain and back pain and difficulty in ambulation.  Left upper extremity lymphedema and recurrent cellulitis however patient is taking Augmentin at the first sign of redness and has prevented hospitalization.  Patient is getting physiotherapy and lymphedema therapy.  Here for further follow-up and treatment consideration .  Patient is here for ongoing evaluation and continuation of treatment.  Since January patient did not have any  cellulitis. Chills.  No fever. Patient continues to a problem with both knee also low back pain and frequent fall incontinence of urine in wheelchair.  Declining performance status REVIEW OF SYSTEMS:   \General status: Ambulating with the help of walker.  Performance status is declining to 1.  HEENT: No headache no dizziness.  No soreness in the mouth. Lungs: Shortness of breath on exertion.  Cough.  No hemoptysis. Cardiac: No chest pain.  No palpitation. GI: No nausea.  No vomiting.  No diarrhea.  No rectal bleeding.  Musculoskeletal system: Osteoarthritis bony pain joint pains Neurological system: No headache no dizziness.  No other focal symptoms. Musculoskeletal system: Joint pains.  Swelling of the left upper extremity. Skin: No rash. Lower extremity no edema. All other systems have been reviewed  As per HPI. Otherwise, a complete review of systems is negatve.  PAST MEDICAL HISTORY: Past Medical History  Diagnosis Date  . Breast cancer ([place])   . Lymphedema   . Arthritis     osteoarthritis  . Anxiety   . Sleep apnea   . Carcinoma of left breast ([place]) 05/26/2014    PAST SURGICAL HISTORY: Past Surgical History  Procedure Laterality Date  . Mastectomy Left 2002  . Eye surgery    . Breast biopsy  1980    FAMILY HISTORY Family History  Problem Relation Age of Onset  . Breast cancer Maternal Aunt   . Breast cancer Cousin   . Breast cancer Cousin     GYNECOLOGIC HISTORY:  No LMP recorded.     ADVANCED DIRECTIVES:  Patient does  have advance Directive   HEALTH MAINTENANCE: Social History  Substance Use Topics  . Smoking status: Never Smoker   . Smokeless tobacco: None  . Alcohol Use: No      Allergies  Allergen Reactions  . Codeine     Other reaction(s): Unknown    Current Outpatient Prescriptions  Medication Sig Dispense Refill  . Calcium Carbonate-Vitamin D 600-400 MG-UNIT per tablet Take 1 tablet by mouth daily.    . diclofenac sodium (VOLTAREN) 1 %  GEL Apply 2 g topically 4 (four) times daily.    Marland Kitchen esomeprazole (NEXIUM) 20 MG capsule Take 20 mg by mouth daily.    . fexofenadine-pseudoephedrine (ALLEGRA-D 24) 180-240 MG per 24 hr tablet Take 1 tablet by mouth daily.    . furosemide (LASIX) 40 MG tablet Take 40 mg by mouth daily.    Marland Kitchen glimepiride (AMARYL) 4 MG tablet Take 4 mg by mouth daily with breakfast.    . losartan (COZAAR) 100 MG tablet Take 100 mg by mouth daily.    . magnesium oxide (MAG-OX) 400 MG tablet Take 400 mg by mouth daily.    Marland Kitchen oxybutynin (DITROPAN) 5 MG tablet Take 5 mg by mouth once. As needed for bladder spasms - may take 1 tablet in addition if needed daily    . PARoxetine (PAXIL-CR) 25 MG 24 hr tablet Take 25 mg by mouth daily.    . pregabalin (LYRICA) 50 MG capsule Take 50 mg by mouth 2 (two) times daily.    . saxagliptin HCl (ONGLYZA) 2.5 MG TABS tablet Take 2.5 mg by mouth daily.    . vitamin B-12 (CYANOCOBALAMIN) 250 MCG tablet Take 250 mcg by mouth daily.    . vitamin C (ASCORBIC ACID) 500 MG tablet Take 500 mg by mouth daily.    . Vitamin D, Ergocalciferol, (DRISDOL) 50000 UNITS CAPS capsule Take 50,000 Units by mouth every 7 (seven) days.     No current facility-administered medications for this visit.    OBJECTIVE:  Filed Vitals:   11/20/14 1209  BP: 189/97  Pulse: 79  Temp: 97.5 F (36.4 C)     Body mass index is 38.03 kg/(m^2).    ECOG FS:1 - Symptomatic but completely ambulatory  PHYSICAL EXAM: Goal status: Performance status is good.  Patient has not lost significant weight HEENT: No evidence of stomatitis.   Sclera and conjunctivae :: No jaundice.   pale looking . Lungs: Air  entry equal on both sides.  No rhonchi.  No rales.  Cardiac: Heart sounds are normal.  No pericardial rub.  No murmur. Lymphatic system: Cervical, axillary, inguinal, lymph nodes not palpable GI: Abdomen is soft.  No ascites.  Liver spleen not palpable.  No tenderness.  Bowel sounds are within normal limit Lower  extremity: No edema.  Left upper extremity lymphedema unchanged. Neurological system: Higher functions, cranial nerves intact No evidence of peripheral neuropathy. Skin: No rash.  No ecchymosis.. Breasts: Right breast free of masses.  Left chest wall area no evidence of recurrent disease. :   LAB RESULTS:  Appointment on 11/20/2014  Component Date Value Ref Range Status  . WBC 11/20/2014 5.5  3.6 - 11.0 K/uL Final  . RBC 11/20/2014 4.16  3.80 - 5.20 MIL/uL Final  . Hemoglobin 11/20/2014 13.0  12.0 - 16.0 g/dL Final  . HCT 11/20/2014 38.8  35.0 - 47.0 % Final  . MCV 11/20/2014 93.2  80.0 - 100.0 fL Final  . MCH 11/20/2014 31.3  26.0 - 34.0 pg Final  .  MCHC 11/20/2014 33.5  32.0 - 36.0 g/dL Final  . RDW 11/20/2014 13.1  11.5 - 14.5 % Final  . Platelets 11/20/2014 192  150 - 440 K/uL Final  . Neutrophils Relative % 11/20/2014 52   Final  . Neutro Abs 11/20/2014 2.9  1.4 - 6.5 K/uL Final  . Lymphocytes Relative 11/20/2014 34   Final  . Lymphs Abs 11/20/2014 1.9  1.0 - 3.6 K/uL Final  . Monocytes Relative 11/20/2014 10   Final  . Monocytes Absolute 11/20/2014 0.5  0.2 - 0.9 K/uL Final  . Eosinophils Relative 11/20/2014 3   Final  . Eosinophils Absolute 11/20/2014 0.2  0 - 0.7 K/uL Final  . Basophils Relative 11/20/2014 1   Final  . Basophils Absolute 11/20/2014 0.1  0 - 0.1 K/uL Final  . Sodium 11/20/2014 131* 135 - 145 mmol/L Final   SPECIMEN 2+ HEMOLYZED INTERPRET RESULTS WITH CAUTION  . Potassium 11/20/2014 4.2  3.5 - 5.1 mmol/L Final  . Chloride 11/20/2014 100* 101 - 111 mmol/L Final  . CO2 11/20/2014 23  22 - 32 mmol/L Final  . Glucose, Bld 11/20/2014 185* 65 - 99 mg/dL Final  . BUN 11/20/2014 17  6 - 20 mg/dL Final  . Creatinine, Ser 11/20/2014 0.82  0.44 - 1.00 mg/dL Final  . Calcium 11/20/2014 9.0  8.9 - 10.3 mg/dL Final  . Total Protein 11/20/2014 7.0  6.5 - 8.1 g/dL Final  . Albumin 11/20/2014 3.7  3.5 - 5.0 g/dL Final  . AST 11/20/2014 24  15 - 41 U/L Final  . ALT  11/20/2014 16  14 - 54 U/L Final  . Alkaline Phosphatase 11/20/2014 71  38 - 126 U/L Final  . Total Bilirubin 11/20/2014 0.8  0.3 - 1.2 mg/dL Final  . GFR calc non Af Amer 11/20/2014 >60  >60 mL/min Final  . GFR calc Af Amer 11/20/2014 >60  >60 mL/min Final   Comment: (NOTE) The eGFR has been calculated using the CKD EPI equation. This calculation has not been validated in all clinical situations. eGFR's persistently <60 mL/min signify possible Chronic Kidney Disease.   . Anion gap 11/20/2014 8  5 - 15 Final    Lab Results  Component Value Date   LABCA2 15.5 05/21/2014     STUDIES: IMPRESSION: No mammographic evidence of malignancy. A result letter of this screening mammogram will be mailed directly to the patient.  RECOMMENDATION: Screening mammogram in one year. (Code:SM-B-01Y)  BI-RADS CATEGORY 1: Negative ASSESSMENT: 81 year old lady with stage IV carcinoma breast on clinical ground there is no evidence of recurrent disease Recurrent cellulitis with lymphedema in left upper extremity Osteoarthritis and declining performance status  MEDICAL DECISION MAKING:  All lab data has been reviewed. Overall declining performance status due to multiple medical issues There is no evidence of recurrent disease Lymphedema left upper extremity with recurrent cellulitis unchanged Last mammogram was in April of 2016 and was BI-RADS 1 Repeat mammogram in April of 2017 has been scheduled.  Continue follow-up  Patient expressed understanding and was in agreement with this plan. She also understands that She can call clinic at any time with any questions, concerns, or complaints.    Carcinoma of left breast   Staging form: Breast, AJCC 7th Edition     Clinical: Stage IIIB (T4d, N1, M0) - Unsigned   Forest Gleason, MD   11/20/2014 12:20 PM

## 2014-12-13 ENCOUNTER — Ambulatory Visit: Payer: Medicare Other | Admitting: Physical Therapy

## 2014-12-18 ENCOUNTER — Encounter: Payer: Self-pay | Admitting: Physical Therapy

## 2014-12-18 ENCOUNTER — Ambulatory Visit: Payer: Medicare Other | Attending: Physical Medicine and Rehabilitation | Admitting: Physical Therapy

## 2014-12-18 DIAGNOSIS — R2681 Unsteadiness on feet: Secondary | ICD-10-CM | POA: Insufficient documentation

## 2014-12-18 DIAGNOSIS — M25561 Pain in right knee: Secondary | ICD-10-CM | POA: Insufficient documentation

## 2014-12-18 DIAGNOSIS — R262 Difficulty in walking, not elsewhere classified: Secondary | ICD-10-CM | POA: Diagnosis present

## 2014-12-18 DIAGNOSIS — M25562 Pain in left knee: Secondary | ICD-10-CM | POA: Diagnosis present

## 2014-12-18 NOTE — Therapy (Signed)
[place] MAIN [facility] SERVICES [address] [place], [place], [address] Phone: [phone]   Fax:  [phone]  Physical Therapy Evaluation  Patient Details  Name: Janet Elliott MRN: [id number] Date of Birth: 12-19-1932 Referring Provider: 11/23/14  Encounter Date: 12/18/2014      PT End of Session - 12/18/14 1107    Visit Number 1   Number of Visits 25   Date for PT Re-Evaluation 23-Apr-2015   Authorization Type g codes   Equipment Utilized During Treatment Gait belt   Activity Tolerance Patient tolerated treatment well   Behavior During Therapy [facility] for tasks assessed/performed      Past Medical History  Diagnosis Date  . Breast cancer ([place])   . Lymphedema   . Arthritis     osteoarthritis  . Anxiety   . Sleep apnea   . Carcinoma of left breast ([place]) 05/26/2014    Past Surgical History  Procedure Laterality Date  . Mastectomy Left 2002  . Eye surgery    . Breast biopsy  1980    There were no vitals filed for this visit.  Visit Diagnosis:  Difficulty walking  Knee pain, bilateral  Unsteady gait      Subjective Assessment - 12/18/14 1104    Subjective Patient has lumbar pain and had some shots to her back and now is having decreased balance.    Patient is accompained by: Family member   Currently in Pain? No/denies            [facility] PT Assessment - 12/18/14 0001    Assessment   Medical Diagnosis unsteady gait   Referring Provider 11/23/14   Hand Dominance Right   Next MD Visit chasnis   Prior Therapy yes   Precautions   Precautions Fall   Balance Screen   Has the patient fallen in the past 6 months Yes   Has the patient had a decrease in activity level because of a fear of falling?  Yes   Is the patient reluctant to leave their home because of a fear of falling?  Yes   [place] residence   Living Arrangements Other relatives   Available Help at Discharge Family   Type  of [place] to enter   Entrance Stairs-Number of Steps [place] One level   [place] - 2 wheels;Walker - 4 wheels;Cane - single point;Grab bars - toilet;Grab bars - tub/shower;Shower seat;Bedside commode;Other (comment)  transport chair   Prior Function   Level of [place] Needs assistance with ADLs      PAIN:  Patient has reports of wrist pain, back pain and knee pain but is currently not having pain.   POSTURE: flexed posture   PROM/AROM: decreased flexibility in BLE hamstring   STRENGTH:  Graded on a 0-5 scale Muscle Group Left Right  Shoulder flex    Shoulder Abd    Shoulder Ext    Shoulder IR/ER    Elbow    Wrist/hand    Hip Flex 3 3  Hip Abd 3   Hip Add 2 2  Hip Ext 2 2  Hip IR/ER 3 3  Knee Flex 3 3  Knee Ext 3 3  Ankle DF 4 4  Ankle PF 4 4   SENSATION:  WFL    :   FUNCTIONAL MOBILITY: slow and guarded  BALANCE: unable to tandem stand,  unable to single leg stand   GAIT: flexed posture with Rw and slow gait speed, decreased step height and decreased step length  OUTCOME MEASURES: TEST Outcome Interpretation  5 times sit<>stand 1.12 sec >60 yo, >15 sec indicates increased risk for falls  10 meter walk test    . 22             m/s <1.0 m/s indicates increased risk for falls; limited community ambulator  Timed up and Go   1.46              sec <14 sec indicates increased risk for falls  6 minute walk test 175 feet 1000 for community distances                                               Problem List Patient Active Problem List   Diagnosis Date Noted  . Carcinoma of left breast ([name]) 05/26/2014  . Adult BMI 30+ 03/20/2014  . Arthritis of knee, degenerative 10/24/2013  . Focal lymphocytic colitis 10/11/2013  . Lymphocytic colitis 10/11/2013  . Impingement syndrome of shoulder 09/05/2013  . Combined fat and carbohydrate induced  hyperlipemia 07/08/2013  . Benign hypertension 07/08/2013  . Diabetes mellitus, type 2 ([place]) 07/08/2013  . Type 2 diabetes mellitus ([place]) 07/08/2013  . Bulge of lumbar disc without myelopathy 05/22/2013    Alanson Puls 12/18/2014, 11:13 AM  [place] MAIN [facility] SERVICES [address] [place], [place], [address] Phone: [phone]   Fax:  [phone]  Name: Janet Elliott MRN: [id number] Date of Birth: 1932-11-28

## 2014-12-25 ENCOUNTER — Ambulatory Visit: Payer: Medicare Other | Admitting: Physical Therapy

## 2014-12-25 ENCOUNTER — Encounter: Payer: Self-pay | Admitting: Physical Therapy

## 2014-12-25 DIAGNOSIS — R2681 Unsteadiness on feet: Secondary | ICD-10-CM

## 2014-12-25 DIAGNOSIS — R262 Difficulty in walking, not elsewhere classified: Secondary | ICD-10-CM

## 2014-12-25 DIAGNOSIS — M25561 Pain in right knee: Secondary | ICD-10-CM

## 2014-12-25 DIAGNOSIS — M25562 Pain in left knee: Secondary | ICD-10-CM

## 2014-12-25 NOTE — Therapy (Signed)
[place] MAIN [facility] SERVICES [address] [place], [place], [address] Phone: [phone]   Fax:  [phone]  Physical Therapy Treatment  Patient Details  Name: Janet Elliott MRN: [id number] Date of Birth: 10/18/1932 Referring Provider: 11/23/14  Encounter Date: 12/25/2014      PT End of Session - 12/25/14 1121    Visit Number 2   Number of Visits 25   Date for PT Re-Evaluation 04-15-15   Authorization Type g codes   PT Start Time 04/12/1113   PT Stop Time 1155   PT Time Calculation (min) 40 min   Equipment Utilized During Treatment Gait belt   Activity Tolerance Patient tolerated treatment well   Behavior During Therapy [facility] for tasks assessed/performed      Past Medical History  Diagnosis Date  . Breast cancer ([place])   . Lymphedema   . Arthritis     osteoarthritis  . Anxiety   . Sleep apnea   . Carcinoma of left breast ([place]) 05/26/2014    Past Surgical History  Procedure Laterality Date  . Mastectomy Left 2000-04-12  . Eye surgery    . Breast biopsy  1980    There were no vitals filed for this visit.  Visit Diagnosis:  Difficulty walking  Knee pain, bilateral  Unsteady gait      Subjective Assessment - 12/25/14 1120    Subjective Patient has lumbar pain and had some shots to her back and now is having decreased balance.    Patient is accompained by: Family member   Currently in Pain? No/denies         Neuromuscular Re-education:  Feet together and cone placement behind with trunk rotation   Tandem standing in // bars  x 1 minute x 3 repetitions  Step ups to step stool x 10 bilaterally for 2 sets bilaterally    Sit to stand training x5 repetitions for 3 sets, review for hep  Feet together  and holding ball x 1 minute, holding ball and fwd and bwd movement elbow flex/ext x 20, horizontal abd/add with ball and arms extended.   Therapeutic exercise;  Side stepping left and right x 10 x 2 with cues to keep her toes  straight and not turn towards the side she is stepping towards  Leg press x 10 x 3 with  75 lbs, Heel raises standing x20  3 way hip YTB x 10 x 2   Patient needs occasional verbal cueing to improve posture and cueing to correctly perform exercises slowly, holding at end of range to increase motor firing of desired muscle to encourage fatigue. She needs cuing to keep her shoulders upright with LE backward extension.                          PT Education - 12/25/14 1120    Education provided Yes   Education Details HEP   Person(s) Educated Patient   Methods Explanation   Comprehension Verbalized understanding             PT Long Term Goals - 12/18/14 1134    PT LONG TERM GOAL #1   Title Patient will be independent in home exercise program to improve strength/mobility for better functional independence with ADLs   Time 12   Period Weeks   Status New   PT LONG TERM GOAL #2   Title Patient (> 64 years old) will complete five times sit to stand test  in < 15 seconds indicating an increased LE strength and improved balance   Time 12   Period Weeks   Status New   PT LONG TERM GOAL #3   Title Patient will increase six minute walk test distance to >1000 for progression to community ambulator and improve gait ability   Time 12   Period Weeks   Status New   PT LONG TERM GOAL #4   Title Patient will increase 10 meter walk test to >1.62m/s as to improve gait speed for better community ambulation and to reduce fall risk   Time 12   Period Weeks               Plan - 12/25/14 1121    Clinical Impression Statement Fatigue with sit to stand but demonstrating more control, Increase weight for standing exercises. Fatigue still evident with nu-step  and endurance.    Pt will benefit from skilled therapeutic intervention in order to improve on the following deficits Abnormal gait;Decreased balance;Decreased endurance;Decreased mobility;Difficulty  walking;Obesity;Decreased activity tolerance;Decreased strength;Pain   Rehab Potential Fair   PT Frequency 2x / week   PT Duration 12 weeks   PT Treatment/Interventions Gait training;Balance training;Neuromuscular re-education;Therapeutic exercise;Therapeutic activities;Manual techniques   PT Next Visit Plan balance training   PT Home Exercise Plan sit to stand , 4 way hip.   Consulted and Agree with Plan of Care Patient        Problem List Patient Active Problem List   Diagnosis Date Noted  . Carcinoma of left breast ([place]) 05/26/2014  . Adult BMI 30+ 03/20/2014  . Arthritis of knee, degenerative 10/24/2013  . Focal lymphocytic colitis 10/11/2013  . Lymphocytic colitis 10/11/2013  . Impingement syndrome of shoulder 09/05/2013  . Combined fat and carbohydrate induced hyperlipemia 07/08/2013  . Benign hypertension 07/08/2013  . Diabetes mellitus, type 2 ([place]) 07/08/2013  . Type 2 diabetes mellitus ([place]) 07/08/2013  . Bulge of lumbar disc without myelopathy 05/22/2013    Alanson Puls 12/25/2014, 11:23 AM  [place] MAIN [facility] SERVICES [address] [place], [place], [address] Phone: [phone]   Fax:  [phone]  Name: Janet Elliott MRN: [id number] Date of Birth: 02/27/1932

## 2014-12-27 ENCOUNTER — Encounter: Payer: Self-pay | Admitting: Physical Therapy

## 2014-12-27 ENCOUNTER — Encounter: Payer: Medicare Other | Admitting: Physical Therapy

## 2014-12-27 ENCOUNTER — Ambulatory Visit: Payer: Medicare Other | Admitting: Physical Therapy

## 2014-12-27 DIAGNOSIS — R262 Difficulty in walking, not elsewhere classified: Secondary | ICD-10-CM

## 2014-12-27 DIAGNOSIS — R2681 Unsteadiness on feet: Secondary | ICD-10-CM

## 2014-12-27 DIAGNOSIS — M25562 Pain in left knee: Secondary | ICD-10-CM

## 2014-12-27 DIAGNOSIS — M25561 Pain in right knee: Secondary | ICD-10-CM

## 2014-12-27 NOTE — Therapy (Signed)
[place] MAIN [facility] SERVICES [address] [place], [place], [address] Phone: [phone]   Fax:  [phone]  Physical Therapy Treatment  Patient Details  Name: CITLALLY OGANDO MRN: [id number] Date of Birth: 1932-01-18 Referring Provider: 11/23/14  Encounter Date: 12/27/2014      PT End of Session - 12/27/14 1513    Visit Number 3   Number of Visits 25   Date for PT Re-Evaluation 03-May-2015   Authorization Type g codes   PT Start Time 0305   PT Stop Time 0345   PT Time Calculation (min) 40 min   Equipment Utilized During Treatment Gait belt   Activity Tolerance Patient tolerated treatment well   Behavior During Therapy [facility] for tasks assessed/performed      Past Medical History  Diagnosis Date  . Breast cancer ([place])   . Lymphedema   . Arthritis     osteoarthritis  . Anxiety   . Sleep apnea   . Carcinoma of left breast ([place]) 05/26/2014    Past Surgical History  Procedure Laterality Date  . Mastectomy Left April 30, 2000  . Eye surgery    . Breast biopsy  1980    There were no vitals filed for this visit.  Visit Diagnosis:  Difficulty walking  Knee pain, bilateral  Unsteady gait      Subjective Assessment - 12/27/14 1512    Subjective Patient has lumbar pain and had some shots to her back and now is having decreased balance. She is doing fine today.   Patient is accompained by: Family member   Currently in Pain? No/denies           Therapeutic exercise standing hip abd  x 5  TM  stepping  TM . 3 m/hr x 4 minutes and 1 minute 40 sec Marching x 10 repetitions BLE Stepping in parallel bars x 5  step ups from floor to 6 inch stool x 7 bilateral sit to stand x 3 Siting LAQ with RTBx 3 with pain, marching seated with RTB x 10  Patient needs occasional verbal cueing to improve posture and cueing to correctly perform exercises                                  PT Education - 12/27/14 1513    Education provided Yes   Education Details HEP   Person(s) Educated Patient   Methods Explanation   Comprehension Verbalized understanding             PT Long Term Goals - 12/18/14 1132-04-30    PT LONG TERM GOAL #1   Title Patient will be independent in home exercise program to improve strength/mobility for better functional independence with ADLs   Time 12   Period Weeks   Status New   PT LONG TERM GOAL #2   Title Patient (> 46 years old) will complete five times sit to stand test in < 15 seconds indicating an increased LE strength and improved balance   Time 12   Period Weeks   Status New   PT LONG TERM GOAL #3   Title Patient will increase six minute walk test distance to >1000 for progression to community ambulator and improve gait ability   Time 12   Period Weeks   Status New   PT LONG TERM GOAL #4   Title Patient will increase 10 meter walk test to >1.61m/s as to improve  gait speed for better community ambulation and to reduce fall risk   Time 12   Period Weeks               Plan - 12/27/14 1514    Clinical Impression Statement Muscle fatigue but no major pain complaints. Patient advancing to red theraband for exercises listed above.   Pt will benefit from skilled therapeutic intervention in order to improve on the following deficits Abnormal gait;Decreased balance;Decreased endurance;Decreased mobility;Difficulty walking;Obesity;Decreased activity tolerance;Decreased strength;Pain   Rehab Potential Fair   PT Frequency 2x / week   PT Duration 12 weeks   PT Treatment/Interventions Gait training;Balance training;Neuromuscular re-education;Therapeutic exercise;Therapeutic activities;Manual techniques   PT Next Visit Plan balance training   PT Home Exercise Plan sit to stand , 4 way hip.   Consulted and Agree with Plan of Care Patient        Problem List Patient Active Problem List   Diagnosis Date Noted  . Carcinoma of left breast ([place]) 05/26/2014  . Adult  BMI 30+ 03/20/2014  . Arthritis of knee, degenerative 10/24/2013  . Focal lymphocytic colitis 10/11/2013  . Lymphocytic colitis 10/11/2013  . Impingement syndrome of shoulder 09/05/2013  . Combined fat and carbohydrate induced hyperlipemia 07/08/2013  . Benign hypertension 07/08/2013  . Diabetes mellitus, type 2 ([place]) 07/08/2013  . Type 2 diabetes mellitus ([place]) 07/08/2013  . Bulge of lumbar disc without myelopathy 05/22/2013    Alanson Puls 12/27/2014, 3:15 PM  [place] MAIN [facility] SERVICES [address] [place], [place], [address] Phone: [phone]   Fax:  [phone]  Name: CIAN BALDASSARRE MRN: [id number] Date of Birth: 1932/08/21

## 2015-01-01 ENCOUNTER — Ambulatory Visit: Payer: Medicare Other

## 2015-01-01 VITALS — BP 156/72 | HR 85

## 2015-01-01 DIAGNOSIS — R2681 Unsteadiness on feet: Secondary | ICD-10-CM

## 2015-01-01 DIAGNOSIS — R262 Difficulty in walking, not elsewhere classified: Secondary | ICD-10-CM

## 2015-01-01 NOTE — Patient Instructions (Addendum)
   Marching in Place: Varied Surfaces    March in place, slowly lifting knees toward ceiling. Repeat _10___ times per session. Do _2___ sessions per day. Perform on firm surface with wall or counter nearby for safety. Can perform in corner with chair in front  Mini-Squats (Standing)    Stand with support. Bend knees slightly. Hold for _2__ seconds. Return to straight standing. Relax for _2__ seconds. Repeat 10 then repeat for another 10 after a 1 minute break.  Do _2__ times a day.  EXTENSION: Standing - Resistance Band (Active)    Stand, both feet flat. Against yellow resistance band, draw right leg behind body as far as possible. Repeat with the opposite side. Complete _10___ repetitions, rest 1 minute and then perform another 10. Perform _2__ sessions per day.   ABDUCTION: Standing - Resistance Band (Active)    Stand, feet flat. Against yellow resistance band, lift right leg out to side. Repeat with the other side. Complete _10___ repetitions, rest 1 minute and then perform another 10. Perform _2__ sessions per day.

## 2015-01-01 NOTE — Therapy (Signed)
[place] MAIN [facility] SERVICES [address] [place], [place], [address] Phone: [phone]   Fax:  [phone]  Physical Therapy Treatment  Patient Details  Name: Janet Elliott MRN: [id number] Date of Birth: Feb 08, 1932 Referring Provider: 11/23/14  Encounter Date: 01/01/2015      PT End of Session - 01/01/15 1112    Visit Number 4   Number of Visits 25   Date for PT Re-Evaluation 2015-04-16   Authorization Type g codes   PT Start Time 1055   PT Stop Time 1140   PT Time Calculation (min) 45 min   Equipment Utilized During Treatment Gait belt   Activity Tolerance Patient tolerated treatment well   Behavior During Therapy [facility] for tasks assessed/performed      Past Medical History  Diagnosis Date  . Breast cancer ([place])   . Lymphedema   . Arthritis     osteoarthritis  . Anxiety   . Sleep apnea   . Carcinoma of left breast ([place]) 05/26/2014    Past Surgical History  Procedure Laterality Date  . Mastectomy Left 2002  . Eye surgery    . Breast biopsy  1980    Filed Vitals:   01/01/15 1058  BP: 156/72  Pulse: 85  SpO2: 98%    Visit Diagnosis:  Difficulty walking  Unsteady gait      Subjective Assessment - 01/01/15 1102    Subjective Pt reports that she feels tired today. She has "had a hard time waking up this morning." Pt states that she has done her HEP "a few times" but overall sounds like she has poor compliance with HEP. No reported pain upon arrival and no specific questions or concerns.   Patient is accompained by: Family member   Currently in Pain? No/denies       TREATMENT  Warm-up on NuStep L1 x 1 minute, L3 x 3 minutes, L4 x 2 minute during history. Fatigue monitored throughout. Pt has difficulty rating exertion on RPE so attempted to achieve "medium" difficulty."  THERE-EX Quantum leg press 90# x 10, Attempted sit to stand from regular height chair but pt unable to perform without UE support; Sit to stand  from regular height chair with Airex on cushion x 2, discontinued due to increasing L knee pain; Step-ups to 6" step with bilateral UE support x 5 bilateral, mild increase in L knee pain but tolerable for exercise; Standing mini squats x 10; Seated marches x 10 bilateral;  NEUROMUSCULAR RE-EDUCATION Marching in // bars with single UE support x 10 bilateral; Heel raises with bilateral UE support as needed x 10; NBOS balance without UE support eyes open/closed x 30 seconds; Gait in // bars without UE support x 3 lengths with cues for increased step length; Patient needs occasional verbal cueing to improve posture and cueing to correctly perform exercises                             PT Education - 01/01/15 1112    Education provided Yes   Education Details Reinforced importance of HEP, issued written program   Person(s) Educated Patient   Methods Explanation;Handout   Comprehension Verbalized understanding             PT Long Term Goals - 12/18/14 1134    PT LONG TERM GOAL #1   Title Patient will be independent in home exercise program to improve strength/mobility for better functional independence with  ADLs   Time 12   Period Weeks   Status New   PT LONG TERM GOAL #2   Title Patient (> 45 years old) will complete five times sit to stand test in < 15 seconds indicating an increased LE strength and improved balance   Time 12   Period Weeks   Status New   PT LONG TERM GOAL #3   Title Patient will increase six minute walk test distance to >1000 for progression to community ambulator and improve gait ability   Time 12   Period Weeks   Status New   PT LONG TERM GOAL #4   Title Patient will increase 10 meter walk test to >1.2m/s as to improve gait speed for better community ambulation and to reduce fall risk   Time 12   Period Weeks               Plan - 01/01/15 1102    Clinical Impression Statement Pt with low energy during session today  requiring multiple seated rest breaks to complete exercises. She also complains of increasing L knee pain throughout session which limits her participation with exercises. Encouraged pt to continue HEP and follow-up as scheduled. Pt issued written HEP to take with her and explained to patient and sister.    Pt will benefit from skilled therapeutic intervention in order to improve on the following deficits Abnormal gait;Decreased balance;Decreased endurance;Decreased mobility;Difficulty walking;Obesity;Decreased activity tolerance;Decreased strength;Pain   Rehab Potential Fair   PT Frequency 2x / week   PT Duration 12 weeks   PT Treatment/Interventions Gait training;Balance training;Neuromuscular re-education;Therapeutic exercise;Therapeutic activities;Manual techniques   PT Next Visit Plan balance training   PT Home Exercise Plan standing marches, standing mini squats, standing hip extension, standing hip abduction   Consulted and Agree with Plan of Care Patient        Problem List Patient Active Problem List   Diagnosis Date Noted  . Carcinoma of left breast ([place]) 05/26/2014  . Adult BMI 30+ 03/20/2014  . Arthritis of knee, degenerative 10/24/2013  . Focal lymphocytic colitis 10/11/2013  . Lymphocytic colitis 10/11/2013  . Impingement syndrome of shoulder 09/05/2013  . Combined fat and carbohydrate induced hyperlipemia 07/08/2013  . Benign hypertension 07/08/2013  . Diabetes mellitus, type 2 ([place]) 07/08/2013  . Type 2 diabetes mellitus ([place]) 07/08/2013  . Bulge of lumbar disc without myelopathy 05/22/2013   Phillips Grout PT, DPT   Huprich,Jason 01/01/2015, 12:06 PM  [place] MAIN [facility] SERVICES [address] [place], [place], [address] Phone: [phone]   Fax:  [phone]  Name: Janet Elliott MRN: [id number] Date of Birth: [birth year]

## 2015-01-03 ENCOUNTER — Ambulatory Visit: Payer: Medicare Other

## 2015-01-03 ENCOUNTER — Encounter: Payer: Self-pay | Admitting: Physical Therapy

## 2015-01-03 VITALS — BP 169/61 | HR 80

## 2015-01-03 DIAGNOSIS — M25562 Pain in left knee: Secondary | ICD-10-CM

## 2015-01-03 DIAGNOSIS — R2681 Unsteadiness on feet: Secondary | ICD-10-CM

## 2015-01-03 DIAGNOSIS — R262 Difficulty in walking, not elsewhere classified: Secondary | ICD-10-CM

## 2015-01-03 DIAGNOSIS — M25561 Pain in right knee: Secondary | ICD-10-CM

## 2015-01-03 NOTE — Therapy (Signed)
[place] MAIN [facility] SERVICES [address] [place], [place], [address] Phone: [phone]   Fax:  [phone]  Physical Therapy Treatment  Patient Details  Name: Janet Elliott MRN: [id number] Date of Birth: 11/26/1932 Referring Provider: 11/23/14  Encounter Date: 01/03/2015      PT End of Session - 01/03/15 1123    Visit Number 5   Number of Visits 25   Date for PT Re-Evaluation 2015-05-01   Authorization Type g codes   PT Start Time 1100   PT Stop Time 1145   PT Time Calculation (min) 45 min   Equipment Utilized During Treatment Gait belt   Activity Tolerance Patient tolerated treatment well   Behavior During Therapy [facility] for tasks assessed/performed      Past Medical History  Diagnosis Date  . Breast cancer ([place])   . Lymphedema   . Arthritis     osteoarthritis  . Anxiety   . Sleep apnea   . Carcinoma of left breast ([place]) 05/26/2014    Past Surgical History  Procedure Laterality Date  . Mastectomy Left 2002  . Eye surgery    . Breast biopsy  1980    Filed Vitals:   01/03/15 1108  BP: 169/61  Pulse: 80  SpO2: 100%    Visit Diagnosis:  Difficulty walking  Unsteady gait  Knee pain, bilateral      Subjective Assessment - 01/03/15 1139    Subjective Pt reports that she feels tired today. She has "had a hard time waking up this morning." Pt states that she has done her HEP "a few times" but overall sounds like she has poor compliance with HEP. No reported pain upon arrival and no specific questions or concerns.   Patient is accompained by: Family member      TREATMENT  GAIT TRAINING Performed gait training with patient on treadmill. Constant feedback from therapist provided for posture and step length as well as continual adjustment of speed. Pt most consistently at 0.30mph. She tends to drift to the R side of the treadmill.  THERE-EX Quantum leg press 75#  2 x 10, 90# x 10; Standing mini squats 2 x 10; Seated  marches 2 x 10 bilateral with 2# ankle weights; Seated LAQ 2 x 10 with 2# ankle weights;   NEUROMUSCULAR RE-EDUCATION Marching in // bars with single UE support x 10 bilateral; Heel/toe raises with single UE support x 10; Cone taps with single UE support alternating LE; Patient needs occasional verbal cueing to improve posture and cueing to correctly perform exercises                            PT Education - 01/03/15 1122    Education provided Yes   Education Details Reinforced importance of HEP   Person(s) Educated Patient   Methods Explanation   Comprehension Verbalized understanding             PT Long Term Goals - 12/18/14 1134    PT LONG TERM GOAL #1   Title Patient will be independent in home exercise program to improve strength/mobility for better functional independence with ADLs   Time 12   Period Weeks   Status New   PT LONG TERM GOAL #2   Title Patient (> 23 years old) will complete five times sit to stand test in < 15 seconds indicating an increased LE strength and improved balance   Time 12  Period Weeks   Status New   PT LONG TERM GOAL #3   Title Patient will increase six minute walk test distance to >1000 for progression to community ambulator and improve gait ability   Time 12   Period Weeks   Status New   PT LONG TERM GOAL #4   Title Patient will increase 10 meter walk test to >1.45m/s as to improve gait speed for better community ambulation and to reduce fall risk   Time 12   Period Weeks               Plan - 01/03/15 1123    Clinical Impression Statement Pt again with low energy during session today requiring seated rest breaks and slow transitions between exercises. She continues to be limited by L knee pain with results in lower level seated exercises. Pt has diffiuclty with transfers, especially on/off leg press machine requiring min to modA+1 assist. Pt encouraged to continue HEP and follow-up as scheduled.    Pt  will benefit from skilled therapeutic intervention in order to improve on the following deficits Abnormal gait;Decreased balance;Decreased endurance;Decreased mobility;Difficulty walking;Obesity;Decreased activity tolerance;Decreased strength;Pain   Rehab Potential Fair   PT Frequency 2x / week   PT Duration 12 weeks   PT Treatment/Interventions Gait training;Balance training;Neuromuscular re-education;Therapeutic exercise;Therapeutic activities;Manual techniques   PT Next Visit Plan balance training   PT Home Exercise Plan standing marches, standing mini squats, standing hip extension, standing hip abduction   Consulted and Agree with Plan of Care Patient        Problem List Patient Active Problem List   Diagnosis Date Noted  . Carcinoma of left breast ([place]) 05/26/2014  . Adult BMI 30+ 03/20/2014  . Arthritis of knee, degenerative 10/24/2013  . Focal lymphocytic colitis 10/11/2013  . Lymphocytic colitis 10/11/2013  . Impingement syndrome of shoulder 09/05/2013  . Combined fat and carbohydrate induced hyperlipemia 07/08/2013  . Benign hypertension 07/08/2013  . Diabetes mellitus, type 2 ([place]) 07/08/2013  . Type 2 diabetes mellitus ([place]) 07/08/2013  . Bulge of lumbar disc without myelopathy 05/22/2013   Phillips Grout PT, DPT   Antjuan Rothe 01/03/2015, 11:54 AM  [place] MAIN [facility] SERVICES [address] [place], [place], [address] Phone: [phone]   Fax:  [phone]  Name: Janet Elliott MRN: [id number] Date of Birth: [birth year]

## 2015-01-09 ENCOUNTER — Ambulatory Visit: Payer: Medicare Other | Admitting: Physical Therapy

## 2015-01-15 ENCOUNTER — Ambulatory Visit: Payer: Medicare Other | Admitting: Physical Therapy

## 2015-01-17 ENCOUNTER — Encounter: Payer: Self-pay | Admitting: Physical Therapy

## 2015-01-17 ENCOUNTER — Ambulatory Visit: Payer: Medicare Other | Attending: Physical Medicine and Rehabilitation | Admitting: Physical Therapy

## 2015-01-17 DIAGNOSIS — M25561 Pain in right knee: Secondary | ICD-10-CM | POA: Insufficient documentation

## 2015-01-17 DIAGNOSIS — M25562 Pain in left knee: Secondary | ICD-10-CM | POA: Insufficient documentation

## 2015-01-17 DIAGNOSIS — R262 Difficulty in walking, not elsewhere classified: Secondary | ICD-10-CM | POA: Diagnosis not present

## 2015-01-17 DIAGNOSIS — R2681 Unsteadiness on feet: Secondary | ICD-10-CM | POA: Diagnosis present

## 2015-01-17 NOTE — Therapy (Signed)
[place] MAIN [facility] SERVICES [address] [place], [place], [address] Phone: [phone]   Fax:  [phone]  Physical Therapy Treatment  Patient Details  Name: Janet Elliott MRN: [id number] Date of Birth: 1932-02-08 Referring Provider: 11/23/14  Encounter Date: 01/17/2015      PT End of Session - 01/17/15 1121    Visit Number 6   Number of Visits 25   Date for PT Re-Evaluation 12-Apr-2015   Authorization Type g codes   PT Start Time 04-10-03   PT Stop Time 1145   PT Time Calculation (min) 40 min   Equipment Utilized During Treatment Gait belt   Activity Tolerance Patient tolerated treatment well   Behavior During Therapy [facility] for tasks assessed/performed      Past Medical History  Diagnosis Date  . Breast cancer ([place])   . Lymphedema   . Arthritis     osteoarthritis  . Anxiety   . Sleep apnea   . Carcinoma of left breast ([place]) 05/26/2014    Past Surgical History  Procedure Laterality Date  . Mastectomy Left April 09, 2000  . Eye surgery    . Breast biopsy  1980    There were no vitals filed for this visit.  Visit Diagnosis:  Difficulty walking  Unsteady gait  Knee pain, bilateral      Subjective Assessment - 01/17/15 1118    Subjective Patient has been sick and is having BLE knee pain today.    Patient is accompained by: Family member   Currently in Pain? Yes   Pain Score 5    Pain Location Knee   Pain Orientation Right;Left   Pain Descriptors / Indicators Sore      standing hip abd with YTB x 20  Walking fwd in parallel bars using BUE for support, walking 15 feet with spc with CGA side stepping left and right in parallel bars 10 feet x 3 standing on blue foam with cone reaching x 20 across midline step ups from floor to 6 inch stool x 20 bilateral sit to stand x 10 marching in parallel bars x 20 Mod cueing needed to appropriately perform strengthening  tasks with leg, and head position. Decreased coordination demonstrated  requiring consistent verbal cueing to correct form. Cognitive understanding of task was delayed, and needs repeated cuing to complete exercise.  Patient responds well to verbal and tactile cues to correct form and technique.  CGA to SBA for safety with activities.  Uses to increase intensity and amplitude of movements throughout session.                            PT Education - 01/17/15 1121    Education provided Yes   Education Details HEP   Person(s) Educated Patient   Methods Explanation   Comprehension Verbalized understanding             PT Long Term Goals - 12/18/14 1134    PT LONG TERM GOAL #1   Title Patient will be independent in home exercise program to improve strength/mobility for better functional independence with ADLs   Time 12   Period Weeks   Status New   PT LONG TERM GOAL #2   Title Patient (> 6 years old) will complete five times sit to stand test in < 15 seconds indicating an increased LE strength and improved balance   Time 12   Period Weeks   Status New  PT LONG TERM GOAL #3   Title Patient will increase six minute walk test distance to >1000 for progression to community ambulator and improve gait ability   Time 12   Period Weeks   Status New   PT LONG TERM GOAL #4   Title Patient will increase 10 meter walk test to >1.54m/s as to improve gait speed for better community ambulation and to reduce fall risk   Time 12   Period Weeks               Plan - 01/17/15 1121    Clinical Impression Statement Patient has slowness of all mobility including transfers from sit to stand and needs seated rest breaks during and between exercises. She has BLE knee pain/soreness and has weakness and poor mobility with walking and transfers.   Pt will benefit from skilled therapeutic intervention in order to improve on the following deficits Abnormal gait;Decreased balance;Decreased endurance;Decreased mobility;Difficulty  walking;Obesity;Decreased activity tolerance;Decreased strength;Pain   Rehab Potential Fair   PT Frequency 2x / week   PT Duration 12 weeks   PT Treatment/Interventions Gait training;Balance training;Neuromuscular re-education;Therapeutic exercise;Therapeutic activities;Manual techniques   PT Next Visit Plan balance training   PT Home Exercise Plan standing marches, standing mini squats, standing hip extension, standing hip abduction   Consulted and Agree with Plan of Care Patient        Problem List Patient Active Problem List   Diagnosis Date Noted  . Carcinoma of left breast ([place]) 05/26/2014  . Adult BMI 30+ 03/20/2014  . Arthritis of knee, degenerative 10/24/2013  . Focal lymphocytic colitis 10/11/2013  . Lymphocytic colitis 10/11/2013  . Impingement syndrome of shoulder 09/05/2013  . Combined fat and carbohydrate induced hyperlipemia 07/08/2013  . Benign hypertension 07/08/2013  . Diabetes mellitus, type 2 ([place]) 07/08/2013  . Type 2 diabetes mellitus ([place]) 07/08/2013  . Bulge of lumbar disc without myelopathy 05/22/2013    Alanson Puls 01/17/2015, 11:24 AM  [place] MAIN [facility] SERVICES [address] [place], [place], [address] Phone: [phone]   Fax:  [phone]  Name: Janet Elliott MRN: [id number] Date of Birth: Dec 11, 1932

## 2015-01-18 ENCOUNTER — Ambulatory Visit: Payer: Medicare Other | Admitting: Physical Therapy

## 2015-01-18 ENCOUNTER — Encounter: Payer: Self-pay | Admitting: Physical Therapy

## 2015-01-18 DIAGNOSIS — R262 Difficulty in walking, not elsewhere classified: Secondary | ICD-10-CM | POA: Diagnosis not present

## 2015-01-18 DIAGNOSIS — M25561 Pain in right knee: Secondary | ICD-10-CM

## 2015-01-18 DIAGNOSIS — R2681 Unsteadiness on feet: Secondary | ICD-10-CM

## 2015-01-18 DIAGNOSIS — M25562 Pain in left knee: Secondary | ICD-10-CM

## 2015-01-18 NOTE — Therapy (Signed)
[place] MAIN [facility] SERVICES [address] [place], [place], [address] Phone: [phone]   Fax:  [phone]  Physical Therapy Treatment  Patient Details  Name: Janet Elliott MRN: [id number] Date of Birth: 11-26-1932 Referring Provider: 11/23/14  Encounter Date: 01/18/2015      PT End of Session - 01/18/15 1616    Visit Number 7   Number of Visits 25   Date for PT Re-Evaluation 04-17-15   Authorization Type g codes   PT Start Time 0340   PT Stop Time 0420   PT Time Calculation (min) 40 min   Equipment Utilized During Treatment Gait belt   Activity Tolerance Patient tolerated treatment well   Behavior During Therapy [facility] for tasks assessed/performed      Past Medical History  Diagnosis Date  . Breast cancer ([place])   . Lymphedema   . Arthritis     osteoarthritis  . Anxiety   . Sleep apnea   . Carcinoma of left breast ([place]) 05/26/2014    Past Surgical History  Procedure Laterality Date  . Mastectomy Left 2002  . Eye surgery    . Breast biopsy  1980    There were no vitals filed for this visit.  Visit Diagnosis:  Difficulty walking  Unsteady gait  Knee pain, bilateral      Subjective Assessment - 01/18/15 1615    Subjective Patient has been sick and is having BLE knee pain today.    Patient is accompained by: Family member   Currently in Pain? No/denies      THER-EX Standing exercises with YTB  ankle: Marching 2 x 10; SLR 2 x 10; Abduction 2 x 10; Extension 2 x 10; Knee flexion 2 x 10; Heel raises 2 x 10;  Resisted side-steeping RTB 4 lengths x 2; Standing mini squats 2 x 10 with RTB around knees to encourage abduction; Sit to stand with UE support 2 x 10; Step-ups to 6" step x 10 bilateral; Quantum leg press 75# x 10, Mod cueing needed to appropriately perform strengthening tasks with leg, and head position. Decreased coordination demonstrated requiring consistent verbal cueing to correct form. Cognitive  understanding of task was delayed. Patient responds well to verbal and tactile cues to correct form and technique.  CGA to SBA for safety with activities.  Uses to increase intensity and amplitude of movements throughout session.                           PT Education - 01/18/15 1615    Education provided Yes   Education Details HEP   Person(s) Educated Patient   Methods Explanation   Comprehension Verbalized understanding             PT Long Term Goals - 12/18/14 1134    PT LONG TERM GOAL #1   Title Patient will be independent in home exercise program to improve strength/mobility for better functional independence with ADLs   Time 12   Period Weeks   Status New   PT LONG TERM GOAL #2   Title Patient (> 21 years old) will complete five times sit to stand test in < 15 seconds indicating an increased LE strength and improved balance   Time 12   Period Weeks   Status New   PT LONG TERM GOAL #3   Title Patient will increase six minute walk test distance to >1000 for progression to community ambulator and improve gait  ability   Time 12   Period Weeks   Status New   PT LONG TERM GOAL #4   Title Patient will increase 10 meter walk test to >1.14m/s as to improve gait speed for better community ambulation and to reduce fall risk   Time 12   Period Weeks               Plan - 01/18/15 1616    Clinical Impression Statement Muscle fatigue but no major pain complaints. Patient advancing to red theraband for exercises listed above.Continues to have balance deficits typical with diagnosis. Patient performs intermediate level exercises without pain behaviors and needs verbal cuing for postural alignment and head positioning.   Pt will benefit from skilled therapeutic intervention in order to improve on the following deficits Abnormal gait;Decreased balance;Decreased endurance;Decreased mobility;Difficulty walking;Obesity;Decreased activity tolerance;Decreased  strength;Pain   Rehab Potential Fair   PT Frequency 2x / week   PT Duration 12 weeks   PT Treatment/Interventions Gait training;Balance training;Neuromuscular re-education;Therapeutic exercise;Therapeutic activities;Manual techniques   PT Next Visit Plan balance training   PT Home Exercise Plan standing marches, standing mini squats, standing hip extension, standing hip abduction   Consulted and Agree with Plan of Care Patient        Problem List Patient Active Problem List   Diagnosis Date Noted  . Carcinoma of left breast ([place]) 05/26/2014  . Adult BMI 30+ 03/20/2014  . Arthritis of knee, degenerative 10/24/2013  . Focal lymphocytic colitis 10/11/2013  . Lymphocytic colitis 10/11/2013  . Impingement syndrome of shoulder 09/05/2013  . Combined fat and carbohydrate induced hyperlipemia 07/08/2013  . Benign hypertension 07/08/2013  . Diabetes mellitus, type 2 ([place]) 07/08/2013  . Type 2 diabetes mellitus ([place]) 07/08/2013  . Bulge of lumbar disc without myelopathy 05/22/2013    Alanson Puls 01/18/2015, 4:19 PM  [place] MAIN [facility] SERVICES [address] [place], [place], [address] Phone: [phone]   Fax:  [phone]  Name: Janet Elliott MRN: [id number] Date of Birth: [birth year]

## 2015-01-22 ENCOUNTER — Ambulatory Visit: Payer: Medicare Other | Admitting: Physical Therapy

## 2015-01-22 ENCOUNTER — Encounter: Payer: Self-pay | Admitting: Physical Therapy

## 2015-01-22 DIAGNOSIS — R262 Difficulty in walking, not elsewhere classified: Secondary | ICD-10-CM | POA: Diagnosis not present

## 2015-01-22 DIAGNOSIS — R2681 Unsteadiness on feet: Secondary | ICD-10-CM

## 2015-01-22 DIAGNOSIS — M25562 Pain in left knee: Secondary | ICD-10-CM

## 2015-01-22 DIAGNOSIS — M25561 Pain in right knee: Secondary | ICD-10-CM

## 2015-01-22 NOTE — Therapy (Signed)
[place] MAIN [facility] SERVICES [address] [place], [place], [address] Phone: [phone]   Fax:  [phone]  Physical Therapy Treatment  Patient Details  Name: Janet Elliott MRN: [id number] Date of Birth: 19-Jul-1932 Referring Provider: 11/23/14  Encounter Date: 01/22/2015      PT End of Session - 01/22/15 1113    Visit Number 8   Number of Visits 25   Date for PT Re-Evaluation 05/08/2015   Authorization Type g codes   PT Start Time 1100   PT Stop Time 1145   PT Time Calculation (min) 45 min   Equipment Utilized During Treatment Gait belt   Activity Tolerance Patient tolerated treatment well   Behavior During Therapy [facility] for tasks assessed/performed      Past Medical History  Diagnosis Date  . Breast cancer ([place])   . Lymphedema   . Arthritis     osteoarthritis  . Anxiety   . Sleep apnea   . Carcinoma of left breast ([place]) 05/26/2014    Past Surgical History  Procedure Laterality Date  . Mastectomy Left 2002  . Eye surgery    . Breast biopsy  1980    There were no vitals filed for this visit.  Visit Diagnosis:  Difficulty walking  Unsteady gait  Knee pain, bilateral      Subjective Assessment - 01/22/15 1112    Subjective Patient has been sick and is having BLE knee pain today.    Patient is accompained by: Family member      [place] Standing exercises with 2# ankle weights: Marching 2 x 10; SLR 2 x 10; Abduction 2 x 10; Extension 2 x 10; Knee flexion 2 x 10; Heel raises 2 x 10;  Resisted side-steeping RTB 4 lengths x 2; Standing mini squats 2 x 10 with RTB around knees to encourage abduction; Sit to stand without UE support 2 x 10; Step-ups to 6" step x 10 bilateral; Quantum leg press 105# x 10, 120# x 10; Muscle fatigue but no major pain complaints. Patient advancing to red theraband for exercises listed above. Min cueing needed to appropriately performstrengthening tasks with leg, and head position.  Decreased coordination demonstrated requiring consistent verbal cueing to correct form. Cognitive understanding of task was delayed. Patient responds well to verbal and tactile cues to correct form and technique.  CGA to SBA for safety with activities.  Uses to increase intensity and amplitude of movements throughout session                           PT Education - 01/22/15 1112    Education provided Yes   Education Details HEP   Person(s) Educated Patient   Methods Explanation   Comprehension Verbalized understanding             PT Long Term Goals - 12/18/14 1134    PT LONG TERM GOAL #1   Title Patient will be independent in home exercise program to improve strength/mobility for better functional independence with ADLs   Time 12   Period Weeks   Status New   PT LONG TERM GOAL #2   Title Patient (> 64 years old) will complete five times sit to stand test in < 15 seconds indicating an increased LE strength and improved balance   Time 12   Period Weeks   Status New   PT LONG TERM GOAL #3   Title Patient will increase six minute walk  test distance to >1000 for progression to community ambulator and improve gait ability   Time 12   Period Weeks   Status New   PT LONG TERM GOAL #4   Title Patient will increase 10 meter walk test to >1.52m/s as to improve gait speed for better community ambulation and to reduce fall risk   Time 12   Period Weeks               Plan - 01/22/15 1113    Clinical Impression Statement Decreased coordination demonstrated requiring consistent verbal cueing to correct form.   Pt will benefit from skilled therapeutic intervention in order to improve on the following deficits Abnormal gait;Decreased balance;Decreased endurance;Decreased mobility;Difficulty walking;Obesity;Decreased activity tolerance;Decreased strength;Pain   Rehab Potential Fair   PT Frequency 2x / week   PT Duration 12 weeks   PT Treatment/Interventions Gait  training;Balance training;Neuromuscular re-education;Therapeutic exercise;Therapeutic activities;Manual techniques   PT Next Visit Plan balance training   PT Home Exercise Plan standing marches, standing mini squats, standing hip extension, standing hip abduction   Consulted and Agree with Plan of Care Patient        Problem List Patient Active Problem List   Diagnosis Date Noted  . Carcinoma of left breast ([place]) 05/26/2014  . Adult BMI 30+ 03/20/2014  . Arthritis of knee, degenerative 10/24/2013  . Focal lymphocytic colitis 10/11/2013  . Lymphocytic colitis 10/11/2013  . Impingement syndrome of shoulder 09/05/2013  . Combined fat and carbohydrate induced hyperlipemia 07/08/2013  . Benign hypertension 07/08/2013  . Diabetes mellitus, type 2 ([place]) 07/08/2013  . Type 2 diabetes mellitus ([place]) 07/08/2013  . Bulge of lumbar disc without myelopathy 05/22/2013    Alanson Puls 01/22/2015, 11:18 AM  [place] MAIN [facility] SERVICES [address] [place], [place], [address] Phone: [phone]   Fax:  [phone]  Name: Janet Elliott MRN: [id number] Date of Birth: December 07, 1932

## 2015-01-24 ENCOUNTER — Ambulatory Visit: Payer: Medicare Other | Admitting: Physical Therapy

## 2015-01-24 ENCOUNTER — Encounter: Payer: Self-pay | Admitting: Physical Therapy

## 2015-01-24 DIAGNOSIS — R262 Difficulty in walking, not elsewhere classified: Secondary | ICD-10-CM

## 2015-01-24 DIAGNOSIS — R2681 Unsteadiness on feet: Secondary | ICD-10-CM

## 2015-01-24 DIAGNOSIS — M25561 Pain in right knee: Secondary | ICD-10-CM

## 2015-01-24 DIAGNOSIS — M25562 Pain in left knee: Secondary | ICD-10-CM

## 2015-01-24 NOTE — Therapy (Signed)
[place] MAIN [facility] SERVICES [address] [place], [place], [address] Phone: [phone]   Fax:  [phone]  Physical Therapy Treatment  Patient Details  Name: Janet Elliott MRN: [id number] Date of Birth: 1932-04-09 Referring Provider: 11/23/14  Encounter Date: 01/24/2015      PT End of Session - 01/24/15 1120    Visit Number 9   Number of Visits 25   Date for PT Re-Evaluation May 09, 2015   Authorization Type g codes   PT Start Time 05/07/1103   PT Stop Time 1145   PT Time Calculation (min) 40 min   Equipment Utilized During Treatment Gait belt   Activity Tolerance Patient tolerated treatment well   Behavior During Therapy [facility] for tasks assessed/performed      Past Medical History  Diagnosis Date  . Breast cancer ([employer])   . Lymphedema   . Arthritis     osteoarthritis  . Anxiety   . Sleep apnea   . Carcinoma of left breast ([place]) 05/26/2014    Past Surgical History  Procedure Laterality Date  . Mastectomy Left 05-06-00  . Eye surgery    . Breast biopsy  1980    There were no vitals filed for this visit.  Visit Diagnosis:  Difficulty walking  Unsteady gait  Knee pain, bilateral      Subjective Assessment - 01/24/15 1118    Subjective Patient has been sick and is having BLE knee pain today.    Patient is accompained by: Family member   Currently in Pain? Yes   Pain Score 2    Pain Location Knee   Pain Orientation Right;Left      THER-EX Standing exercises with RTB: Marching 2 x 10; SLR 2 x 10; Abduction 2 x 10; Extension 2 x 10; Knee flexion 2 x 10; Heel raises 2 x 10;  Resisted side-steeping RTB 4 lengths x 2; Standing mini squats 2 x 10 with RTB around knees to encourage abduction; Sit to stand without UE support 2 x 10; Step-ups to 6" step x 10 bilateral; Quantum leg press 105# x 10, 120# x 10;  NEUROMUSCULAR RE-EDUCATION Airex NBOS eyes open/closed x 30 seconds each; Airex NBOS eyes open horizontal and  vertical head turns x 30 seconds; Tapping stool from floor   Tandem gait in // bars x 4 laps Strengthening cues PT provided moderate verbal instruction to improve set up, proper use of LE, and improved posture and gait mechanics. Patient responded moderately to instruction                           PT Education - 01/24/15 1120    Education provided Yes   Education Details HEP   Person(s) Educated Patient   Methods Explanation   Comprehension Verbalized understanding             PT Long Term Goals - 12/18/14 1134    PT LONG TERM GOAL #1   Title Patient will be independent in home exercise program to improve strength/mobility for better functional independence with ADLs   Time 12   Period Weeks   Status New   PT LONG TERM GOAL #2   Title Patient (82 years old) will complete five times sit to stand test in < 15 seconds indicating an increased LE strength and improved balance   Time 12   Period Weeks   Status New   PT LONG TERM GOAL #3  Title Patient will increase six minute walk test distance to >1000 for progression to community ambulator and improve gait ability   Time 12   Period Weeks   Status New   PT LONG TERM GOAL #4   Title Patient will increase 10 meter walk test to >1.84m/s as to improve gait speed for better community ambulation and to reduce fall risk   Time 12   Period Weeks               Plan - 01/24/15 1120    Clinical Impression Statement Patient continues to have BLE knee and hip weakness and poor balance.    Pt will benefit from skilled therapeutic intervention in order to improve on the following deficits Abnormal gait;Decreased balance;Decreased endurance;Decreased mobility;Difficulty walking;Obesity;Decreased activity tolerance;Decreased strength;Pain   Rehab Potential Fair   PT Frequency 2x / week   PT Duration 12 weeks   PT Treatment/Interventions Gait training;Balance training;Neuromuscular re-education;Therapeutic  exercise;Therapeutic activities;Manual techniques   PT Next Visit Plan balance training   PT Home Exercise Plan standing marches, standing mini squats, standing hip extension, standing hip abduction   Consulted and Agree with Plan of Care Patient        Problem List Patient Active Problem List   Diagnosis Date Noted  . Carcinoma of left breast ([place]) 05/26/2014  . Adult BMI 30+ 03/20/2014  . Arthritis of knee, degenerative 10/24/2013  . Focal lymphocytic colitis 10/11/2013  . Lymphocytic colitis 10/11/2013  . Impingement syndrome of shoulder 09/05/2013  . Combined fat and carbohydrate induced hyperlipemia 07/08/2013  . Benign hypertension 07/08/2013  . Diabetes mellitus, type 2 ([place]) 07/08/2013  . Type 2 diabetes mellitus ([place]) 07/08/2013  . Bulge of lumbar disc without myelopathy 05/22/2013   Alanson Puls, PT, DPT [place] S 01/24/2015, 11:23 AM  [place] MAIN [facility] SERVICES [address] [place], [place], [address] Phone: [phone]   Fax:  [phone]  Name: DEANNA HRITZ MRN: [id number] Date of Birth: 1932/04/14

## 2015-01-29 ENCOUNTER — Encounter: Payer: Self-pay | Admitting: Physical Therapy

## 2015-01-29 ENCOUNTER — Ambulatory Visit: Payer: Medicare Other | Admitting: Physical Therapy

## 2015-01-29 DIAGNOSIS — R262 Difficulty in walking, not elsewhere classified: Secondary | ICD-10-CM

## 2015-01-29 DIAGNOSIS — R2681 Unsteadiness on feet: Secondary | ICD-10-CM

## 2015-01-29 DIAGNOSIS — M25561 Pain in right knee: Secondary | ICD-10-CM

## 2015-01-29 DIAGNOSIS — M25562 Pain in left knee: Secondary | ICD-10-CM

## 2015-01-29 NOTE — Therapy (Signed)
[place] MAIN [facility] SERVICES [address] [place], [place], [address] Phone: [phone]   Fax:  [phone]  Physical Therapy Treatment  Patient Details  Name: Janet Elliott MRN: [id number] Date of Birth: 1932/01/31 Referring Provider: 11/23/14  Encounter Date: 01/29/2015      PT End of Session - 01/29/15 1308    Visit Number 10   Number of Visits 25   Date for PT Re-Evaluation 04-11-2015   Authorization Type g codes   PT Start Time 0100   PT Stop Time 0145   PT Time Calculation (min) 45 min   Equipment Utilized During Treatment Gait belt   Activity Tolerance Patient tolerated treatment well   Behavior During Therapy [facility] for tasks assessed/performed      Past Medical History  Diagnosis Date  . Breast cancer ([place])   . Lymphedema   . Arthritis     osteoarthritis  . Anxiety   . Sleep apnea   . Carcinoma of left breast ([place]) 05/26/2014    Past Surgical History  Procedure Laterality Date  . Mastectomy Left 2002  . Eye surgery    . Breast biopsy  1980    There were no vitals filed for this visit.  Visit Diagnosis:  Difficulty walking  Unsteady gait  Knee pain, bilateral      Subjective Assessment - 01/29/15 1307    Subjective Patient had a Dr. apt today and she says that she is doing ok today.    Patient is accompained by: Family member   Currently in Pain? Yes   Pain Score 2       Gait training with RW 100 feet x 4 sets with CGA TM walking x 4 minutes x 2 repetititons  4  miles /hour        Therapeutic exercise: standing hip abd with YTB x 20  side stepping left and right in parallel bars 10 feet x 3 standing on blue foam with cone reaching x 20 across midline step ups from floor to 6 inch stool x 20 bilateral sit to stand x 10 marching in parallel bars x 20 stepping pattern with weight shifting fwd/bwd x 10.  Min cueing needed to appropriately perform strengthening  tasks with leg,  and head position.  Decreased coordination demonstrated requiring consistent verbal cueing to correct form. Cognitive understanding of task was delayed. Patient continues to demonstrate some in coordination of movement with select exercises  Patient responds well to verbal and tactile cues to correct form and technique.  CGA to SBA for safety with activities.  Uses to increase intensity and amplitude of movements throughout session         OUTCOME MEASURES: TEST Outcome Interpretation  5 times sit<>stand 47.02 sec >60 yo, >15 sec indicates increased risk for falls  10 meter walk test  . 47 m/s <1.0 m/s indicates increased risk for falls; limited community ambulator  Timed up and Go  1.26 sec <14 sec indicates increased risk for falls  6 minute walk test 210  feet 1000 for community distances                    PT Education - 01/29/15 1307    Education provided Yes   Education Details HEP   Person(s) Educated Patient   Methods Explanation   Comprehension Verbalized understanding             PT Long Term Goals - 12/18/14 1134    PT  LONG TERM GOAL #1   Title Patient will be independent in home exercise program to improve strength/mobility for better functional independence with ADLs   Time 12   Period Weeks   Status New   PT LONG TERM GOAL #2   Title Patient (> 57 years old) will complete five times sit to stand test in < 15 seconds indicating an increased LE strength and improved balance   Time 12   Period Weeks   Status New   PT LONG TERM GOAL #3   Title Patient will increase six minute walk test distance to >1000 for progression to community ambulator and improve gait ability   Time 12   Period Weeks   Status New   PT LONG TERM GOAL #4   Title Patient will increase 10 meter walk test to >1.5m/s as to improve gait speed for better community ambulation and to reduce fall risk   Time 12   Period Weeks               Plan - 01/29/15  1308    Clinical Impression Statement Patient performed outcome measures and strengthening today and will continue to benefit from skilled PT to improve strength and balance.    Pt will benefit from skilled therapeutic intervention in order to improve on the following deficits Abnormal gait;Decreased balance;Decreased endurance;Decreased mobility;Difficulty walking;Obesity;Decreased activity tolerance;Decreased strength;Pain   Rehab Potential Fair   PT Frequency 2x / week   PT Duration 12 weeks   PT Treatment/Interventions Gait training;Balance training;Neuromuscular re-education;Therapeutic exercise;Therapeutic activities;Manual techniques   PT Next Visit Plan balance training   PT Home Exercise Plan standing marches, standing mini squats, standing hip extension, standing hip abduction   Consulted and Agree with Plan of Care Patient        Problem List Patient Active Problem List   Diagnosis Date Noted  . Carcinoma of left breast ([place]) 05/26/2014  . Adult BMI 30+ 03/20/2014  . Arthritis of knee, degenerative 10/24/2013  . Focal lymphocytic colitis 10/11/2013  . Lymphocytic colitis 10/11/2013  . Impingement syndrome of shoulder 09/05/2013  . Combined fat and carbohydrate induced hyperlipemia 07/08/2013  . Benign hypertension 07/08/2013  . Diabetes mellitus, type 2 ([place]) 07/08/2013  . Type 2 diabetes mellitus ([place]) 07/08/2013  . Bulge of lumbar disc without myelopathy 05/22/2013    Alanson Puls 01/29/2015, 1:10 PM  [place] MAIN [facility] SERVICES [address] [place] [place], [place], [address] Phone: [phone]   Fax:  [phone]  Name: Janet Elliott MRN: [id number] Date of Birth: 1932-09-12

## 2015-01-31 ENCOUNTER — Ambulatory Visit: Payer: Medicare Other | Admitting: Physical Therapy

## 2015-01-31 ENCOUNTER — Encounter: Payer: Self-pay | Admitting: Physical Therapy

## 2015-01-31 DIAGNOSIS — R2681 Unsteadiness on feet: Secondary | ICD-10-CM

## 2015-01-31 DIAGNOSIS — M25561 Pain in right knee: Secondary | ICD-10-CM

## 2015-01-31 DIAGNOSIS — R262 Difficulty in walking, not elsewhere classified: Secondary | ICD-10-CM | POA: Diagnosis not present

## 2015-01-31 DIAGNOSIS — M25562 Pain in left knee: Secondary | ICD-10-CM

## 2015-01-31 NOTE — Therapy (Signed)
[place] MAIN [facility] SERVICES [address] [place], [place], [address] Phone: [phone]   Fax:  [phone]  Physical Therapy Treatment  Patient Details  Name: Janet Elliott MRN: [id number] Date of Birth: 1932-05-18 Referring Provider: 11/23/14  Encounter Date: 01/31/2015      PT End of Session - 01/31/15 1310    Visit Number 11   Number of Visits 25   Date for PT Re-Evaluation 2015-04-15   Authorization Type g codes   PT Start Time 0100   PT Stop Time 0145   PT Time Calculation (min) 45 min   Equipment Utilized During Treatment Gait belt   Activity Tolerance Patient tolerated treatment well   Behavior During Therapy [facility] for tasks assessed/performed      Past Medical History  Diagnosis Date  . Breast cancer ([place])   . Lymphedema   . Arthritis     osteoarthritis  . Anxiety   . Sleep apnea   . Carcinoma of left breast ([place]) 05/26/2014    Past Surgical History  Procedure Laterality Date  . Mastectomy Left 2002  . Eye surgery    . Breast biopsy  1980    There were no vitals filed for this visit.  Visit Diagnosis:  Difficulty walking  Unsteady gait  Knee pain, bilateral      Subjective Assessment - 01/31/15 1309    Subjective Patient has a Dr. apt today and she says that she is doing ok today.    Patient is accompained by: Family member   Currently in Pain? Yes   Pain Score 5         THER-EX Nustep L3 x 5 minutes for warm-up  (3 minutes unbilled); Quantum double leg press 90 # x 20 Heel raises with UE support and toes on 2x4, 2 x 10; Mini squats with RTB around knees to prevent valgus 2 x 10; RTB side stepping in // bars 4 lengths x 2; Sit to stand without UE support RTB around knees to prevent valgus 2 x  standing hip abd with YTB x 20  side stepping left and right in parallel bars 10 feet x 3 standing on blue foam with cone reaching x 20 across midline step ups from floor to 6 inch stool x 20 bilateral sit  to stand x 10 marching in parallel bars x 20 stepping pattern with weight shifting fwd/bwd x 10.  Patient needs occasional verbal cueing to improve posture and cueing to correctly perform exercises slowly, holding at end of range to increase motor firing of desired muscle to encourage fatigue.                           PT Education - 01/31/15 1309    Education provided Yes   Education Details HEP   Person(s) Educated Patient   Methods Explanation   Comprehension Verbalized understanding             PT Long Term Goals - 12/18/14 1134    PT LONG TERM GOAL #1   Title Patient will be independent in home exercise program to improve strength/mobility for better functional independence with ADLs   Time 12   Period Weeks   Status New   PT LONG TERM GOAL #2   Title Patient (> 62 years old) will complete five times sit to stand test in < 15 seconds indicating an increased LE strength and improved balance   Time 12  Period Weeks   Status New   PT LONG TERM GOAL #3   Title Patient will increase six minute walk test distance to >1000 for progression to community ambulator and improve gait ability   Time 12   Period Weeks   Status New   PT LONG TERM GOAL #4   Title Patient will increase 10 meter walk test to >1.52m/s as to improve gait speed for better community ambulation and to reduce fall risk   Time 12   Period Weeks               Plan - 01/31/15 1310    Clinical Impression Statement Fatigue with sit to stand but demonstrating more control, Increase weight for standing exercises. Fatigue still evident with cross trainer and endurance.    Pt will benefit from skilled therapeutic intervention in order to improve on the following deficits Abnormal gait;Decreased balance;Decreased endurance;Decreased mobility;Difficulty walking;Obesity;Decreased activity tolerance;Decreased strength;Pain   Rehab Potential Fair   PT Frequency 2x / week   PT Duration 12  weeks   PT Treatment/Interventions Gait training;Balance training;Neuromuscular re-education;Therapeutic exercise;Therapeutic activities;Manual techniques   PT Next Visit Plan balance training   PT Home Exercise Plan standing marches, standing mini squats, standing hip extension, standing hip abduction   Consulted and Agree with Plan of Care Patient        Problem List Patient Active Problem List   Diagnosis Date Noted  . Carcinoma of left breast ([place]) 05/26/2014  . Adult BMI 30+ 03/20/2014  . Arthritis of knee, degenerative 10/24/2013  . Focal lymphocytic colitis 10/11/2013  . Lymphocytic colitis 10/11/2013  . Impingement syndrome of shoulder 09/05/2013  . Combined fat and carbohydrate induced hyperlipemia 07/08/2013  . Benign hypertension 07/08/2013  . Diabetes mellitus, type 2 ([place]) 07/08/2013  . Type 2 diabetes mellitus ([place]) 07/08/2013  . Bulge of lumbar disc without myelopathy 05/22/2013    Alanson Puls 01/31/2015, 1:12 PM  [place] MAIN [facility] SERVICES [address] [place], [place], [address] Phone: [phone]   Fax:  [phone]  Name: Janet Elliott MRN: [id number] Date of Birth: April 16, 1932

## 2015-02-05 ENCOUNTER — Ambulatory Visit: Payer: Medicare Other | Admitting: Physical Therapy

## 2015-02-05 ENCOUNTER — Encounter: Payer: Self-pay | Admitting: Physical Therapy

## 2015-02-05 DIAGNOSIS — R2681 Unsteadiness on feet: Secondary | ICD-10-CM

## 2015-02-05 DIAGNOSIS — R262 Difficulty in walking, not elsewhere classified: Secondary | ICD-10-CM | POA: Diagnosis not present

## 2015-02-05 DIAGNOSIS — M25562 Pain in left knee: Secondary | ICD-10-CM

## 2015-02-05 DIAGNOSIS — M25561 Pain in right knee: Secondary | ICD-10-CM

## 2015-02-05 NOTE — Therapy (Signed)
[place] MAIN [facility] SERVICES [address] [place], [place], [address] Phone: [phone]   Fax:  [phone]  Physical Therapy Treatment  Patient Details  Name: Janet Elliott MRN: [id number] Date of Birth: 03/10/1932 Referring Provider: 11/23/14  Encounter Date: 02/05/2015      PT End of Session - 02/05/15 1330    Visit Number 12   Number of Visits 25   Date for PT Re-Evaluation 04/15/15   Authorization Type g codes   PT Start Time 0100   PT Stop Time 0145   PT Time Calculation (min) 45 min   Equipment Utilized During Treatment Gait belt   Activity Tolerance Patient tolerated treatment well   Behavior During Therapy [facility] for tasks assessed/performed      Past Medical History  Diagnosis Date  . Breast cancer ([place])   . Lymphedema   . Arthritis     osteoarthritis  . Anxiety   . Sleep apnea   . Carcinoma of left breast ([place]) 05/26/2014    Past Surgical History  Procedure Laterality Date  . Mastectomy Left 2002  . Eye surgery    . Breast biopsy  1980    There were no vitals filed for this visit.  Visit Diagnosis:  Difficulty walking  Unsteady gait  Knee pain, bilateral      Subjective Assessment - 02/05/15 1319    Subjective Patient has a Dr. apt today and she says that she is doing ok today.    Patient is accompained by: Family member   Currently in Pain? No/denies      standing hip abd with YTB x 20  side stepping left and right in parallel bars 10 feet x 3 step ups from floor to 6 inch stool x 20 bilateral sit to stand x 10 marching in parallel bars x 20 Tm walking at . 5 m/hour x 5 minutes, 2 minutes Leg press 90 lbs x 20 x 3 Tapping stool with one hand and cuing for posture and looking up ahead Min cueing needed to appropriately perform  tasks with leg, and head position. Decreased coordination demonstrated requiring consistent verbal cueing to correct form. Patient responds well to verbal and tactile cues to  correct form and technique.  CGA to SBA for safety with activities.  Uses to increase intensity and amplitude of movements throughout session.                             PT Education - 02/05/15 1329    Education provided Yes   Education Details HEP   Person(s) Educated Patient   Methods Explanation   Comprehension Verbalized understanding             PT Long Term Goals - 12/18/14 1134    PT LONG TERM GOAL #1   Title Patient will be independent in home exercise program to improve strength/mobility for better functional independence with ADLs   Time 12   Period Weeks   Status New   PT LONG TERM GOAL #2   Title Patient (> 14 years old) will complete five times sit to stand test in < 15 seconds indicating an increased LE strength and improved balance   Time 12   Period Weeks   Status New   PT LONG TERM GOAL #3   Title Patient will increase six minute walk test distance to >1000 for progression to community ambulator and improve gait ability  Time 12   Period Weeks   Status New   PT LONG TERM GOAL #4   Title Patient will increase 10 meter walk test to >1.35m/s as to improve gait speed for better community ambulation and to reduce fall risk   Time 12   Period Weeks               Plan - 02/05/15 1331    Clinical Impression Statement Patient demonstrating more control with  squat exercise.   Pt will benefit from skilled therapeutic intervention in order to improve on the following deficits Abnormal gait;Decreased balance;Decreased endurance;Decreased mobility;Difficulty walking;Obesity;Decreased activity tolerance;Decreased strength;Pain   Rehab Potential Fair   PT Frequency 2x / week   PT Duration 12 weeks   PT Treatment/Interventions Gait training;Balance training;Neuromuscular re-education;Therapeutic exercise;Therapeutic activities;Manual techniques   PT Next Visit Plan balance training   PT Home Exercise Plan standing marches, standing mini  squats, standing hip extension, standing hip abduction   Consulted and Agree with Plan of Care Patient        Problem List Patient Active Problem List   Diagnosis Date Noted  . Carcinoma of left breast ([place]) 05/26/2014  . Adult BMI 30+ 03/20/2014  . Arthritis of knee, degenerative 10/24/2013  . Focal lymphocytic colitis 10/11/2013  . Lymphocytic colitis 10/11/2013  . Impingement syndrome of shoulder 09/05/2013  . Combined fat and carbohydrate induced hyperlipemia 07/08/2013  . Benign hypertension 07/08/2013  . Diabetes mellitus, type 2 ([place]) 07/08/2013  . Type 2 diabetes mellitus ([place]) 07/08/2013  . Bulge of lumbar disc without myelopathy 05/22/2013   Alanson Puls, PT, DPT [place], [place] S 02/05/2015, 1:34 PM  [place] MAIN [facility] SERVICES [address] [place], [place], [address] Phone: [phone]   Fax:  [phone]  Name: Janet Elliott MRN: [id number] Date of Birth: June 16, 1932

## 2015-02-07 ENCOUNTER — Encounter: Payer: Self-pay | Admitting: Physical Therapy

## 2015-02-07 ENCOUNTER — Ambulatory Visit: Payer: Medicare Other | Attending: Physical Medicine and Rehabilitation | Admitting: Physical Therapy

## 2015-02-07 DIAGNOSIS — R2681 Unsteadiness on feet: Secondary | ICD-10-CM | POA: Diagnosis present

## 2015-02-07 DIAGNOSIS — M25562 Pain in left knee: Secondary | ICD-10-CM | POA: Insufficient documentation

## 2015-02-07 DIAGNOSIS — R262 Difficulty in walking, not elsewhere classified: Secondary | ICD-10-CM | POA: Insufficient documentation

## 2015-02-07 DIAGNOSIS — M25561 Pain in right knee: Secondary | ICD-10-CM | POA: Diagnosis present

## 2015-02-07 NOTE — Therapy (Signed)
[place] MAIN [facility] SERVICES [address] [place], [place], [address] Phone: [phone]   Fax:  [phone]  Physical Therapy Treatment  Patient Details  Name: Janet Elliott MRN: [id number] Date of Birth: [birth year] Referring Provider: 11/23/14  Encounter Date: 02/07/2015      PT End of Session - 02/07/15 1314    PT Start Time 0105   PT Stop Time 0145   PT Time Calculation (min) 40 min      Past Medical History  Diagnosis Date  . Breast cancer ([place])   . Lymphedema   . Arthritis     osteoarthritis  . Anxiety   . Sleep apnea   . Carcinoma of left breast ([place]) 05/26/2014    Past Surgical History  Procedure Laterality Date  . Mastectomy Left 2002  . Eye surgery    . Breast biopsy  1980    There were no vitals filed for this visit.  Visit Diagnosis:  Difficulty walking  Unsteady gait  Knee pain, bilateral      Subjective Assessment - 02/07/15 1311    Subjective Patient is having less pain in her knees after her cortisone injection.   Patient is accompained by: Family member   Currently in Pain? No/denies      standing hip abd with YTB x 20  side stepping left and right in parallel bars 10 feet x 3 standing on blue foam without UE support static step ups from floor to 6 inch stool x 20 bilateral sit to stand x 10 marching in parallel bars x 20 Leg press x 20 x 2 90 lbs Patient needs occasional verbal cueing to improve posture and step height and cueing to correctly perform exercises slowly, holding at end of range to increase motor firing of desired muscle to encourage fatigue.                            PT Education - 02/07/15 1314    Education provided Yes   Education Details HEP   Person(s) Educated Patient   Methods Explanation   Comprehension Verbalized understanding             PT Long Term Goals - 12/18/14 1134    PT LONG TERM GOAL #1   Title Patient will be independent in  home exercise program to improve strength/mobility for better functional independence with ADLs   Time 12   Period Weeks   Status New   PT LONG TERM GOAL #2   Title Patient (> 44 years old) will complete five times sit to stand test in < 15 seconds indicating an increased LE strength and improved balance   Time 12   Period Weeks   Status New   PT LONG TERM GOAL #3   Title Patient will increase six minute walk test distance to >1000 for progression to community ambulator and improve gait ability   Time 12   Period Weeks   Status New   PT LONG TERM GOAL #4   Title Patient will increase 10 meter walk test to >1.66m/s as to improve gait speed for better community ambulation and to reduce fall risk   Time 12   Period Weeks               Plan - 02/07/15 1314    Clinical Impression Statement Patient has difficulty with picking up her LE's and has shuffling gait with decreased step height.  Pt will benefit from skilled therapeutic intervention in order to improve on the following deficits Abnormal gait;Decreased balance;Decreased endurance;Decreased mobility;Difficulty walking;Obesity;Decreased activity tolerance;Decreased strength;Pain   Rehab Potential Fair   PT Frequency 2x / week   PT Duration 12 weeks   PT Treatment/Interventions Gait training;Balance training;Neuromuscular re-education;Therapeutic exercise;Therapeutic activities;Manual techniques   PT Next Visit Plan balance training   PT Home Exercise Plan standing marches, standing mini squats, standing hip extension, standing hip abduction   Consulted and Agree with Plan of Care Patient        Problem List Patient Active Problem List   Diagnosis Date Noted  . Carcinoma of left breast ([place]) 05/26/2014  . Adult BMI 30+ 03/20/2014  . Arthritis of knee, degenerative 10/24/2013  . Focal lymphocytic colitis 10/11/2013  . Lymphocytic colitis 10/11/2013  . Impingement syndrome of shoulder 09/05/2013  . Combined fat and  carbohydrate induced hyperlipemia 07/08/2013  . Benign hypertension 07/08/2013  . Diabetes mellitus, type 2 ([place]) 07/08/2013  . Type 2 diabetes mellitus ([place]) 07/08/2013  . Bulge of lumbar disc without myelopathy 05/22/2013   Alanson Puls, PT, DPT [place], [place] S 02/07/2015, 1:16 PM  [place] MAIN [facility] SERVICES [address] [place], [place], [address] Phone: [phone]   Fax:  [phone]  Name: Janet Elliott MRN: [id number] Date of Birth: 1932/06/15

## 2015-02-12 ENCOUNTER — Encounter: Payer: Self-pay | Admitting: Physical Therapy

## 2015-02-12 ENCOUNTER — Ambulatory Visit: Payer: Medicare Other | Admitting: Physical Therapy

## 2015-02-12 DIAGNOSIS — R262 Difficulty in walking, not elsewhere classified: Secondary | ICD-10-CM | POA: Diagnosis not present

## 2015-02-12 DIAGNOSIS — M25561 Pain in right knee: Secondary | ICD-10-CM

## 2015-02-12 DIAGNOSIS — R2681 Unsteadiness on feet: Secondary | ICD-10-CM

## 2015-02-12 DIAGNOSIS — M25562 Pain in left knee: Secondary | ICD-10-CM

## 2015-02-12 NOTE — Therapy (Signed)
[place] MAIN [facility] SERVICES [address] [place], [place], [address] Phone: [phone]   Fax:  [phone]  Physical Therapy Treatment  Patient Details  Name: Janet Elliott MRN: [id number] Date of Birth: 17-May-1932 Referring Provider: 11/23/14  Encounter Date: 02/12/2015      PT End of Session - 02/12/15 1308    Visit Number 14   Number of Visits 25   Date for PT Re-Evaluation 05/08/15   Authorization Type g codes   PT Start Time 0100   PT Stop Time 0140   PT Time Calculation (min) 40 min   Equipment Utilized During Treatment Gait belt   Activity Tolerance Patient tolerated treatment well      Past Medical History  Diagnosis Date  . Breast cancer ([place])   . Lymphedema   . Arthritis     osteoarthritis  . Anxiety   . Sleep apnea   . Carcinoma of left breast ([place]) 05/26/2014    Past Surgical History  Procedure Laterality Date  . Mastectomy Left 2002  . Eye surgery    . Breast biopsy  1980    There were no vitals filed for this visit.  Visit Diagnosis:  Difficulty walking  Unsteady gait  Knee pain, bilateral      Subjective Assessment - 02/12/15 1308    Subjective Patient is having less pain in her knees .   Patient is accompained by: Family member   Currently in Pain? No/denies      THER-EX Standing exercises with RTB BLE : Marching 2 x 10; SLR 2 x 10; Abduction 2 x 10; Extension 2 x 10; Knee flexion 2 x 10; Heel raises 2 x 10; Eccentric step downs x 10 BLE Squats x 10 with 5 sec hold Heel raises x 10 x 2  Resisted side-steeping RTB 4 lengths x 2; Standing mini squats 2 x 10 with RTB around knees to encourage abduction; Sit to stand without UE support 2 x 10; Step-ups to 6" step x 10 bilateral; Quantum leg press 90# x 10 x 3 Patient needs occasional verbal cueing to improve posture and cueing to correctly perform exercises slowly, holding at end of range to increase motor firing of desired muscle to  encourage fatigue.                            PT Education - 02/12/15 1308    Education provided Yes   Education Details HEP   Person(s) Educated Patient   Methods Explanation   Comprehension Verbalized understanding             PT Long Term Goals - 12/18/14 1134    PT LONG TERM GOAL #1   Title Patient will be independent in home exercise program to improve strength/mobility for better functional independence with ADLs   Time 12   Period Weeks   Status New   PT LONG TERM GOAL #2   Title Patient (> 56 years old) will complete five times sit to stand test in < 15 seconds indicating an increased LE strength and improved balance   Time 12   Period Weeks   Status New   PT LONG TERM GOAL #3   Title Patient will increase six minute walk test distance to >1000 for progression to community ambulator and improve gait ability   Time 12   Period Weeks   Status New   PT LONG TERM GOAL #4  Title Patient will increase 10 meter walk test to >1.45m/s as to improve gait speed for better community ambulation and to reduce fall risk   Time 12   Period Weeks               Plan - 02/12/15 1309    Clinical Impression Statement Patient has trunk weakness and has difficulty standing up straight to perform exercises.   Pt will benefit from skilled therapeutic intervention in order to improve on the following deficits Abnormal gait;Decreased balance;Decreased endurance;Decreased mobility;Difficulty walking;Obesity;Decreased activity tolerance;Decreased strength;Pain   Rehab Potential Fair   PT Frequency 2x / week   PT Duration 12 weeks   PT Treatment/Interventions Gait training;Balance training;Neuromuscular re-education;Therapeutic exercise;Therapeutic activities;Manual techniques   PT Next Visit Plan balance training   PT Home Exercise Plan standing marches, standing mini squats, standing hip extension, standing hip abduction   Consulted and Agree with Plan of  Care Patient        Problem List Patient Active Problem List   Diagnosis Date Noted  . Carcinoma of left breast ([place]) 05/26/2014  . Adult BMI 30+ 03/20/2014  . Arthritis of knee, degenerative 10/24/2013  . Focal lymphocytic colitis 10/11/2013  . Lymphocytic colitis 10/11/2013  . Impingement syndrome of shoulder 09/05/2013  . Combined fat and carbohydrate induced hyperlipemia 07/08/2013  . Benign hypertension 07/08/2013  . Diabetes mellitus, type 2 ([place]) 07/08/2013  . Type 2 diabetes mellitus ([place]) 07/08/2013  . Bulge of lumbar disc without myelopathy 05/22/2013   Alanson Puls, PT, DPT [place], [place] S 02/12/2015, 1:11 PM  [place] MAIN [facility] SERVICES [address] [place], [place], [address] Phone: [phone]   Fax:  [phone]  Name: SHERISE RICKERD MRN: [id number] Date of Birth: 11/26/1932

## 2015-02-14 ENCOUNTER — Ambulatory Visit: Payer: Medicare Other | Admitting: Physical Therapy

## 2015-02-14 ENCOUNTER — Encounter: Payer: Self-pay | Admitting: Physical Therapy

## 2015-02-14 DIAGNOSIS — R262 Difficulty in walking, not elsewhere classified: Secondary | ICD-10-CM

## 2015-02-14 DIAGNOSIS — M25561 Pain in right knee: Secondary | ICD-10-CM

## 2015-02-14 DIAGNOSIS — R2681 Unsteadiness on feet: Secondary | ICD-10-CM

## 2015-02-14 DIAGNOSIS — M25562 Pain in left knee: Secondary | ICD-10-CM

## 2015-02-14 NOTE — Therapy (Signed)
[place] MAIN [facility] SERVICES [address] [place], [place], [address] Phone: [phone]   Fax:  [phone]  Physical Therapy Treatment  Patient Details  Name: Janet Elliott MRN: [id number] Date of Birth: 03/19/1932 Referring Provider: 11/23/14  Encounter Date: 02/14/2015      PT End of Session - 02/14/15 1333    Visit Number 15   Number of Visits 25   Date for PT Re-Evaluation 04/22/2015   Authorization Type g codes   PT Start Time 0100   PT Stop Time 0145   PT Time Calculation (min) 45 min   Equipment Utilized During Treatment Gait belt   Activity Tolerance Patient tolerated treatment well      Past Medical History  Diagnosis Date  . Breast cancer ([place])   . Lymphedema   . Arthritis     osteoarthritis  . Anxiety   . Sleep apnea   . Carcinoma of left breast ([place]) 05/26/2014    Past Surgical History  Procedure Laterality Date  . Mastectomy Left 2002  . Eye surgery    . Breast biopsy  1980    There were no vitals filed for this visit.  Visit Diagnosis:  Difficulty walking  Unsteady gait  Knee pain, bilateral      Subjective Assessment - 02/14/15 1333    Subjective Patient is having less pain in her knees .   Patient is accompained by: Family member   Currently in Pain? Yes   Pain Score 3       THER-EX Standing exercises with RTB BLE : Marching 2 x 10; SLR 2 x 10; Abduction 2 x 10; Extension 2 x 10; Knee flexion 2 x 10; Heel raises 2 x 10; Eccentric step downs x 10 BLE Squats x 10 with 5 sec hold Heel raises x 10 x 2  Resisted side-steeping RTB 4 lengths x 2; Standing mini squats 2 x 10 with RTB around knees to encourage abduction; Sit to stand without UE support 2 x 10; Step-ups to 6" step x 10 bilateral; Quantum leg press 105# x 10, 120# x 10;  NEUROMUSCULAR RE-EDUCATION Airex NBOS eyes open/closed x 30 seconds each; Airex NBOS eyes open horizontal and vertical head turns x 30 seconds; Airex cone   reaching crossing midline  Toe tapping 6 inch stool without UE assist Tandem gait in // bars x 4 laps  Patient needs constant verbal cueing to improve posture and cueing to correctly perform exercises slowly, holding at end of range to increase motor firing of desired muscle to encourage fatigue.                             PT Education - 02/14/15 1333    Education provided Yes   Education Details HEP   Person(s) Educated Patient   Methods Explanation   Comprehension Verbalized understanding             PT Long Term Goals - 12/18/14 1134    PT LONG TERM GOAL #1   Title Patient will be independent in home exercise program to improve strength/mobility for better functional independence with ADLs   Time 12   Period Weeks   Status New   PT LONG TERM GOAL #2   Title Patient (> 35 years old) will complete five times sit to stand test in < 15 seconds indicating an increased LE strength and improved balance   Time 12  Period Weeks   Status New   PT LONG TERM GOAL #3   Title Patient will increase six minute walk test distance to >1000 for progression to community ambulator and improve gait ability   Time 12   Period Weeks   Status New   PT LONG TERM GOAL #4   Title Patient will increase 10 meter walk test to >1.21m/s as to improve gait speed for better community ambulation and to reduce fall risk   Time 12   Period Weeks               Plan - 02/14/15 1334    Clinical Impression Statement Patient has poor posture with ambulation with flexed trunk, flexed knees and RW is too far out in front of her.    Pt will benefit from skilled therapeutic intervention in order to improve on the following deficits Abnormal gait;Decreased balance;Decreased endurance;Decreased mobility;Difficulty walking;Obesity;Decreased activity tolerance;Decreased strength;Pain   Rehab Potential Fair   PT Frequency 2x / week   PT Duration 12 weeks   PT Treatment/Interventions  Gait training;Balance training;Neuromuscular re-education;Therapeutic exercise;Therapeutic activities;Manual techniques   PT Next Visit Plan balance training   PT Home Exercise Plan standing marches, standing mini squats, standing hip extension, standing hip abduction   Consulted and Agree with Plan of Care Patient        Problem List Patient Active Problem List   Diagnosis Date Noted  . Carcinoma of left breast ([place]) 05/26/2014  . Adult BMI 30+ 03/20/2014  . Arthritis of knee, degenerative 10/24/2013  . Focal lymphocytic colitis 10/11/2013  . Lymphocytic colitis 10/11/2013  . Impingement syndrome of shoulder 09/05/2013  . Combined fat and carbohydrate induced hyperlipemia 07/08/2013  . Benign hypertension 07/08/2013  . Diabetes mellitus, type 2 ([place]) 07/08/2013  . Type 2 diabetes mellitus ([place]) 07/08/2013  . Bulge of lumbar disc without myelopathy 05/22/2013   Alanson Puls, PT, DPT [place], Minette [place] S 02/14/2015, 1:36 PM  [place] MAIN [facility] SERVICES [address] [place], [place], [address] Phone: [phone]   Fax:  [phone]  Name: COZY EADS MRN: [id number] Date of Birth: 1932/07/30

## 2015-02-19 ENCOUNTER — Encounter: Payer: Medicare Other | Admitting: Physical Therapy

## 2015-02-21 ENCOUNTER — Encounter: Payer: Medicare Other | Admitting: Physical Therapy

## 2015-02-26 ENCOUNTER — Encounter: Payer: Medicare Other | Admitting: Physical Therapy

## 2015-02-28 ENCOUNTER — Encounter: Payer: Medicare Other | Admitting: Physical Therapy

## 2015-03-05 ENCOUNTER — Ambulatory Visit: Payer: Medicare Other | Admitting: Physical Therapy

## 2015-03-05 ENCOUNTER — Encounter: Payer: Self-pay | Admitting: Physical Therapy

## 2015-03-05 DIAGNOSIS — M25561 Pain in right knee: Secondary | ICD-10-CM

## 2015-03-05 DIAGNOSIS — R2681 Unsteadiness on feet: Secondary | ICD-10-CM

## 2015-03-05 DIAGNOSIS — R262 Difficulty in walking, not elsewhere classified: Secondary | ICD-10-CM | POA: Diagnosis not present

## 2015-03-05 DIAGNOSIS — M25562 Pain in left knee: Secondary | ICD-10-CM

## 2015-03-05 NOTE — Therapy (Signed)
[place] MAIN [facility] SERVICES [address] [place], [place], [address] Phone: [phone]   Fax:  [phone]  Physical Therapy Treatment  Patient Details  Name: Janet Elliott MRN: [id number] Date of Birth: 08/07/1932 Referring Provider: 11/23/14  Encounter Date: 03/05/2015      PT End of Session - 03/05/15 1314    Visit Number 16   Number of Visits 25   Date for PT Re-Evaluation 05/07/2015   Authorization Type g codes   PT Start Time 0100   PT Stop Time 0145   PT Time Calculation (min) 45 min   Equipment Utilized During Treatment Gait belt   Activity Tolerance Patient tolerated treatment well      Past Medical History  Diagnosis Date  . Breast cancer ([place])   . Lymphedema   . Arthritis     osteoarthritis  . Anxiety   . Sleep apnea   . Carcinoma of left breast ([place]) 05/26/2014    Past Surgical History  Procedure Laterality Date  . Mastectomy Left 2002  . Eye surgery    . Breast biopsy  1980    There were no vitals filed for this visit.  Visit Diagnosis:  Difficulty walking  Unsteady gait  Knee pain, bilateral      Subjective Assessment - 03/05/15 1313    Subjective Patient is having less pain in her knees .   Patient is accompained by: Family member   Currently in Pain? No/denies      THER-EX Nustep L3 x 5 minutes  Quantum double leg press 100 x 15 x 3 Heel raises with UE support and toes on 2x4, 2 x 10; Mini squats with RTB around knees to prevent valgus 2 x 10; RTB side stepping in // bars 4 lengths x 2; Sit to stand without UE support Standing exercises with RTB: Marching 2 x 10; SLR 2 x 10; Abduction 2 x 10; Extension 2 x 10; Knee flexion 2 x 10; Heel raises 2 x 10; Patient needs moderate verbal cueing to improve posture and cueing to correctly perform exercises slowly, holding at end of range to increase motor firing of desired muscle to encourage fatigue.                             PT Education - 03/05/15 1314    Education provided Yes   Education Details HEP   Person(s) Educated Patient   Methods Explanation   Comprehension Verbalized understanding             PT Long Term Goals - 12/18/14 1134    PT LONG TERM GOAL #1   Title Patient will be independent in home exercise program to improve strength/mobility for better functional independence with ADLs   Time 12   Period Weeks   Status New   PT LONG TERM GOAL #2   Title Patient (> 26 years old) will complete five times sit to stand test in < 15 seconds indicating an increased LE strength and improved balance   Time 12   Period Weeks   Status New   PT LONG TERM GOAL #3   Title Patient will increase six minute walk test distance to >1000 for progression to community ambulator and improve gait ability   Time 12   Period Weeks   Status New   PT LONG TERM GOAL #4   Title Patient will increase 10 meter walk test to >1.38m/s  as to improve gait speed for better community ambulation and to reduce fall risk   Time 12   Period Weeks               Plan - 03/05/15 1315    Clinical Impression Statement Patient has flexed trunk and decreased step height and decreased step length BLE with ambulation and while on TM.    Pt will benefit from skilled therapeutic intervention in order to improve on the following deficits Abnormal gait;Decreased balance;Decreased endurance;Decreased mobility;Difficulty walking;Obesity;Decreased activity tolerance;Decreased strength;Pain   Rehab Potential Fair   PT Frequency 2x / week   PT Duration 12 weeks   PT Treatment/Interventions Gait training;Balance training;Neuromuscular re-education;Therapeutic exercise;Therapeutic activities;Manual techniques   PT Next Visit Plan balance training   PT Home Exercise Plan standing marches, standing mini squats, standing hip extension, standing hip abduction   Consulted and Agree with Plan of Care Patient        Problem  List Patient Active Problem List   Diagnosis Date Noted  . Carcinoma of left breast ([place]) 05/26/2014  . Adult BMI 30+ 03/20/2014  . Arthritis of knee, degenerative 10/24/2013  . Focal lymphocytic colitis 10/11/2013  . Lymphocytic colitis 10/11/2013  . Impingement syndrome of shoulder 09/05/2013  . Combined fat and carbohydrate induced hyperlipemia 07/08/2013  . Benign hypertension 07/08/2013  . Diabetes mellitus, type 2 ([place]) 07/08/2013  . Type 2 diabetes mellitus ([place]) 07/08/2013  . Bulge of lumbar disc without myelopathy 05/22/2013   Alanson Puls, PT, DPT [place], Minette [place] S 03/05/2015, 1:18 PM  [place] MAIN [facility] SERVICES [address] [place], [place], [address] Phone: [phone]   Fax:  [phone]  Name: Janet Elliott MRN: [id number] Date of Birth: April 08, 1932

## 2015-03-07 ENCOUNTER — Encounter: Payer: Self-pay | Admitting: Physical Therapy

## 2015-03-07 ENCOUNTER — Ambulatory Visit: Payer: Medicare Other | Attending: Physical Medicine and Rehabilitation | Admitting: Physical Therapy

## 2015-03-07 DIAGNOSIS — R2681 Unsteadiness on feet: Secondary | ICD-10-CM | POA: Diagnosis present

## 2015-03-07 DIAGNOSIS — R262 Difficulty in walking, not elsewhere classified: Secondary | ICD-10-CM | POA: Insufficient documentation

## 2015-03-07 DIAGNOSIS — M25562 Pain in left knee: Secondary | ICD-10-CM | POA: Diagnosis present

## 2015-03-07 DIAGNOSIS — M25561 Pain in right knee: Secondary | ICD-10-CM

## 2015-03-07 NOTE — Therapy (Signed)
[place] MAIN [facility] SERVICES [address] [place], [place], [address] Phone: [phone]   Fax:  [phone]  Physical Therapy Treatment  Patient Details  Name: Janet Elliott MRN: [id number] Date of Birth: 1932/05/07 Referring Provider: 11/23/14  Encounter Date: 03/07/2015      PT End of Session - 03/07/15 1322    Visit Number 17   Number of Visits 25   Date for PT Re-Evaluation 2015/04/17   Authorization Type g codes   PT Start Time 0105   PT Stop Time 0145   PT Time Calculation (min) 40 min   Equipment Utilized During Treatment Gait belt   Activity Tolerance Patient tolerated treatment well      Past Medical History  Diagnosis Date  . Breast cancer ([place])   . Lymphedema   . Arthritis     osteoarthritis  . Anxiety   . Sleep apnea   . Carcinoma of left breast ([place]) 05/26/2014    Past Surgical History  Procedure Laterality Date  . Mastectomy Left 2002  . Eye surgery    . Breast biopsy  1980    There were no vitals filed for this visit.  Visit Diagnosis:  Difficulty walking  Unsteady gait  Knee pain, bilateral      Subjective Assessment - 03/07/15 1322    Subjective Patient is having less pain in her knees .   Patient is accompained by: Family member   Currently in Pain? No/denies      THER-EX Standing exercises with 2# ankle weights: Marching 2 x 10; SLR 2 x 10; Abduction 2 x 10; Extension 2 x 10; Knee flexion 2 x 10; Heel raises 2 x 10;  Resisted side-steeping RTB 4 lengths x 2; Standing mini squats 2 x 10 with RTB around knees to encourage abduction; Sit to stand without UE support 2 x 10; Step-ups to 6" step x 10 bilateral; Quantum leg press 105# x 10, 120# x 10;  NEUROMUSCULAR RE-EDUCATION Airex NBOS eyes open/closed x 30 seconds each; Airex NBOS eyes open horizontal and vertical head turns x 30 seconds; Airex cone taps alternating LE  Patient needs occasional verbal cueing to improve posture and  cueing to correctly perform exercises slowly, holding at end of range to increase motor firing of desired muscle to encourage fatigue.                            PT Education - 03/07/15 1322    Education provided Yes   Education Details HEP   Person(s) Educated Patient   Methods Explanation   Comprehension Verbalized understanding             PT Long Term Goals - 12/18/14 1134    PT LONG TERM GOAL #1   Title Patient will be independent in home exercise program to improve strength/mobility for better functional independence with ADLs   Time 12   Period Weeks   Status New   PT LONG TERM GOAL #2   Title Patient (> 83 years old) will complete five times sit to stand test in < 15 seconds indicating an increased LE strength and improved balance   Time 12   Period Weeks   Status New   PT LONG TERM GOAL #3   Title Patient will increase six minute walk test distance to >1000 for progression to community ambulator and improve gait ability   Time 12   Period Weeks  Status New   PT LONG TERM GOAL #4   Title Patient will increase 10 meter walk test to >1.24m/s as to improve gait speed for better community ambulation and to reduce fall risk   Time 12   Period Weeks               Plan - 03/07/15 1323    Clinical Impression Statement PT provided min  verbal instruction to improve set up, proper use of LE, and improved posture and gait mechanics. Patient responded well  to instruction   Pt will benefit from skilled therapeutic intervention in order to improve on the following deficits Abnormal gait;Decreased balance;Decreased endurance;Decreased mobility;Difficulty walking;Obesity;Decreased activity tolerance;Decreased strength;Pain   Rehab Potential Fair   PT Frequency 2x / week   PT Duration 12 weeks   PT Treatment/Interventions Gait training;Balance training;Neuromuscular re-education;Therapeutic exercise;Therapeutic activities;Manual techniques   PT Next  Visit Plan balance training   PT Home Exercise Plan standing marches, standing mini squats, standing hip extension, standing hip abduction   Consulted and Agree with Plan of Care Patient        Problem List Patient Active Problem List   Diagnosis Date Noted  . Carcinoma of left breast ([place]) 05/26/2014  . Adult BMI 30+ 03/20/2014  . Arthritis of knee, degenerative 10/24/2013  . Focal lymphocytic colitis 10/11/2013  . Lymphocytic colitis 10/11/2013  . Impingement syndrome of shoulder 09/05/2013  . Combined fat and carbohydrate induced hyperlipemia 07/08/2013  . Benign hypertension 07/08/2013  . Diabetes mellitus, type 2 ([place]) 07/08/2013  . Type 2 diabetes mellitus ([place]) 07/08/2013  . Bulge of lumbar disc without myelopathy 05/22/2013  Alanson Puls, PT, DPT  [place], Minette Headland S 03/07/2015, 1:25 PM  [place] MAIN [facility] SERVICES [address] [place], [place], [address] Phone: [phone]   Fax:  [phone]  Name: Janet Elliott MRN: [id number] Date of Birth: 06-Feb-1932

## 2015-03-12 ENCOUNTER — Encounter: Payer: Self-pay | Admitting: Physical Therapy

## 2015-03-12 ENCOUNTER — Ambulatory Visit: Payer: Medicare Other | Admitting: Physical Therapy

## 2015-03-12 DIAGNOSIS — M25562 Pain in left knee: Secondary | ICD-10-CM

## 2015-03-12 DIAGNOSIS — R2681 Unsteadiness on feet: Secondary | ICD-10-CM

## 2015-03-12 DIAGNOSIS — R262 Difficulty in walking, not elsewhere classified: Secondary | ICD-10-CM

## 2015-03-12 DIAGNOSIS — M25561 Pain in right knee: Secondary | ICD-10-CM

## 2015-03-12 NOTE — Therapy (Signed)
[place] MAIN [facility] SERVICES [address] [place], [place], [address] Phone: [phone]   Fax:  [phone]  Physical Therapy Treatment  Patient Details  Name: Janet Elliott MRN: [id number] Date of Birth: 04/23/1932 Referring Provider: 11/23/14  Encounter Date: 03/12/2015      PT End of Session - 03/12/15 1124    Visit Number 18   Number of Visits 25   Date for PT Re-Evaluation 2015/04/14   Authorization Type g codes   PT Start Time April 11, 1113   PT Stop Time 1200   PT Time Calculation (min) 45 min   Equipment Utilized During Treatment Gait belt   Activity Tolerance Patient tolerated treatment well      Past Medical History  Diagnosis Date  . Breast cancer ([place])   . Lymphedema   . Arthritis     osteoarthritis  . Anxiety   . Sleep apnea   . Carcinoma of left breast ([place]) 05/26/2014    Past Surgical History  Procedure Laterality Date  . Mastectomy Left Apr 11, 2000  . Eye surgery    . Breast biopsy  1980    There were no vitals filed for this visit.  Visit Diagnosis:  Difficulty walking  Unsteady gait  Knee pain, bilateral      Subjective Assessment - 03/12/15 1123    Subjective Patient is doing well today, her knees are sore today. She moved boxes in her closet and is a little sore.   Patient is accompained by: Family member   Currently in Pain? Yes   Pain Score 5      Therapeutic exercise:  TM walking at /8 miles/hour x 6 minutes and 4 minutes   standing hip abd with YTB x 20  side stepping left and right in parallel bars 10 feet x 3 standing on blue foam with cone reaching x 20 across midline step ups from floor to 6 inch stool x 20 bilateral sit to stand x 10 marching in parallel bars x 20 Patient needs occasional verbal cueing to improve posture and cueing to correctly perform exercises slowly, holding at end of range to increase motor firing of desired muscle to encourage fatigue.                            PT Education - 03/12/15 1124    Education provided Yes   Education Details HEP   Person(s) Educated Patient   Methods Explanation   Comprehension Verbalized understanding             PT Long Term Goals - 12/18/14 1134    PT LONG TERM GOAL #1   Title Patient will be independent in home exercise program to improve strength/mobility for better functional independence with ADLs   Time 12   Period Weeks   Status New   PT LONG TERM GOAL #2   Title Patient (> 76 years old) will complete five times sit to stand test in < 15 seconds indicating an increased LE strength and improved balance   Time 12   Period Weeks   Status New   PT LONG TERM GOAL #3   Title Patient will increase six minute walk test distance to >1000 for progression to community ambulator and improve gait ability   Time 12   Period Weeks   Status New   PT LONG TERM GOAL #4   Title Patient will increase 10 meter walk test to >1.63m/s as  to improve gait speed for better community ambulation and to reduce fall risk   Time 12   Period Weeks               Plan - 03/12/15 1125    Clinical Impression Statement Patient has fatigue with standing exercises and needs constant VC to have correct posture.   Pt will benefit from skilled therapeutic intervention in order to improve on the following deficits Abnormal gait;Decreased balance;Decreased endurance;Decreased mobility;Difficulty walking;Obesity;Decreased activity tolerance;Decreased strength;Pain   Rehab Potential Fair   PT Frequency 2x / week   PT Duration 12 weeks   PT Treatment/Interventions Gait training;Balance training;Neuromuscular re-education;Therapeutic exercise;Therapeutic activities;Manual techniques   PT Next Visit Plan balance training   PT Home Exercise Plan standing marches, standing mini squats, standing hip extension, standing hip abduction   Consulted and Agree with Plan of Care Patient         Problem List Patient Active Problem List   Diagnosis Date Noted  . Carcinoma of left breast ([place]) 05/26/2014  . Adult BMI 30+ 03/20/2014  . Arthritis of knee, degenerative 10/24/2013  . Focal lymphocytic colitis 10/11/2013  . Lymphocytic colitis 10/11/2013  . Impingement syndrome of shoulder 09/05/2013  . Combined fat and carbohydrate induced hyperlipemia 07/08/2013  . Benign hypertension 07/08/2013  . Diabetes mellitus, type 2 ([place]) 07/08/2013  . Type 2 diabetes mellitus ([place]) 07/08/2013  . Bulge of lumbar disc without myelopathy 05/22/2013   Alanson Puls, PT, DPT [place] 03/12/2015, 11:26 AM  [place] MAIN [facility] SERVICES [address] [place], [place], [address] Phone: [phone]   Fax:  [phone]  Name: Janet Elliott MRN: [id number] Date of Birth: [birth year]

## 2015-04-26 ENCOUNTER — Ambulatory Visit: Payer: Medicare Other

## 2015-05-09 ENCOUNTER — Other Ambulatory Visit: Payer: Self-pay | Admitting: Neurology

## 2015-05-09 ENCOUNTER — Other Ambulatory Visit: Payer: Self-pay | Admitting: Oncology

## 2015-05-09 ENCOUNTER — Ambulatory Visit
Admission: RE | Admit: 2015-05-09 | Discharge: 2015-05-09 | Disposition: A | Discharge status: Home / Self Care | Payer: Medicare Other | Source: Ambulatory Visit | Attending: Oncology | Admitting: Oncology

## 2015-05-09 DIAGNOSIS — C50912 Malignant neoplasm of unspecified site of left female breast: Secondary | ICD-10-CM

## 2015-05-09 DIAGNOSIS — Z1231 Encounter for screening mammogram for malignant neoplasm of breast: Secondary | ICD-10-CM | POA: Insufficient documentation

## 2015-05-09 DIAGNOSIS — R296 Repeated falls: Secondary | ICD-10-CM

## 2015-05-09 DIAGNOSIS — G252 Other specified forms of tremor: Secondary | ICD-10-CM

## 2015-05-15 ENCOUNTER — Ambulatory Visit: Payer: Medicare Other

## 2015-05-20 ENCOUNTER — Ambulatory Visit: Payer: Medicare Other | Admitting: Oncology

## 2015-05-20 ENCOUNTER — Other Ambulatory Visit: Payer: Medicare Other

## 2015-05-29 ENCOUNTER — Encounter: Payer: Self-pay | Admitting: Oncology

## 2015-05-29 ENCOUNTER — Inpatient Hospital Stay: Payer: Medicare Other | Attending: Oncology

## 2015-05-29 ENCOUNTER — Inpatient Hospital Stay (HOSPITAL_BASED_OUTPATIENT_CLINIC_OR_DEPARTMENT_OTHER): Payer: Medicare Other | Admitting: Oncology

## 2015-05-29 VITALS — BP 163/91 | HR 73 | Temp 96.7°F | Resp 18 | Wt 187.3 lb

## 2015-05-29 DIAGNOSIS — Z9012 Acquired absence of left breast and nipple: Secondary | ICD-10-CM | POA: Insufficient documentation

## 2015-05-29 DIAGNOSIS — I890 Lymphedema, not elsewhere classified: Secondary | ICD-10-CM

## 2015-05-29 DIAGNOSIS — M2550 Pain in unspecified joint: Secondary | ICD-10-CM | POA: Diagnosis not present

## 2015-05-29 DIAGNOSIS — Z803 Family history of malignant neoplasm of breast: Secondary | ICD-10-CM | POA: Insufficient documentation

## 2015-05-29 DIAGNOSIS — G4730 Sleep apnea, unspecified: Secondary | ICD-10-CM

## 2015-05-29 DIAGNOSIS — Z853 Personal history of malignant neoplasm of breast: Secondary | ICD-10-CM | POA: Insufficient documentation

## 2015-05-29 DIAGNOSIS — M549 Dorsalgia, unspecified: Secondary | ICD-10-CM | POA: Diagnosis not present

## 2015-05-29 DIAGNOSIS — L03114 Cellulitis of left upper limb: Secondary | ICD-10-CM

## 2015-05-29 DIAGNOSIS — F419 Anxiety disorder, unspecified: Secondary | ICD-10-CM

## 2015-05-29 DIAGNOSIS — Z9221 Personal history of antineoplastic chemotherapy: Secondary | ICD-10-CM | POA: Diagnosis not present

## 2015-05-29 DIAGNOSIS — Z79899 Other long term (current) drug therapy: Secondary | ICD-10-CM | POA: Insufficient documentation

## 2015-05-29 DIAGNOSIS — Z923 Personal history of irradiation: Secondary | ICD-10-CM | POA: Diagnosis not present

## 2015-05-29 DIAGNOSIS — M1990 Unspecified osteoarthritis, unspecified site: Secondary | ICD-10-CM | POA: Diagnosis not present

## 2015-05-29 DIAGNOSIS — C50912 Malignant neoplasm of unspecified site of left female breast: Secondary | ICD-10-CM

## 2015-05-29 LAB — COMPREHENSIVE METABOLIC PANEL
ALBUMIN: 3.8 g/dL (ref 3.5–5.0)
ALK PHOS: 70 U/L (ref 38–126)
ALT: 15 U/L (ref 14–54)
ANION GAP: 6 (ref 5–15)
AST: 21 U/L (ref 15–41)
BILIRUBIN TOTAL: 0.6 mg/dL (ref 0.3–1.2)
BUN: 21 mg/dL — AB (ref 6–20)
CALCIUM: 8.8 mg/dL — AB (ref 8.9–10.3)
CO2: 24 mmol/L (ref 22–32)
Chloride: 103 mmol/L (ref 101–111)
Creatinine, Ser: 0.82 mg/dL (ref 0.44–1.00)
GFR calc Af Amer: >60 mL/min (ref >60)
GFR calc non Af Amer: >60 mL/min (ref >60)
GLUCOSE: 219 mg/dL — AB (ref 65–99)
POTASSIUM: 4.2 mmol/L (ref 3.5–5.1)
SODIUM: 133 mmol/L — AB (ref 135–145)
TOTAL PROTEIN: 7.1 g/dL (ref 6.5–8.1)

## 2015-05-29 LAB — CBC WITH DIFFERENTIAL/PLATELET
BASOS PCT: 1 %
Basophils Absolute: 0.1 10*3/uL (ref 0–0.1)
EOS PCT: 2 %
Eosinophils Absolute: 0.1 10*3/uL (ref 0–0.7)
HCT: 38.2 % (ref 35.0–47.0)
Hemoglobin: 12.7 g/dL (ref 12.0–16.0)
Lymphocytes Relative: 26 %
Lymphs Abs: 1.5 10*3/uL (ref 1.0–3.6)
MCH: 31.3 pg (ref 26.0–34.0)
MCHC: 33.2 g/dL (ref 32.0–36.0)
MCV: 94.3 fL (ref 80.0–100.0)
MONO ABS: 0.6 10*3/uL (ref 0.2–0.9)
Monocytes Relative: 9 %
NEUTROS ABS: 3.7 10*3/uL (ref 1.4–6.5)
Neutrophils Relative %: 62 %
Platelets: 188 10*3/uL (ref 150–440)
RBC: 4.05 MIL/uL (ref 3.80–5.20)
RDW: 13.5 % (ref 11.5–14.5)
WBC: 6 10*3/uL (ref 3.6–11.0)

## 2015-05-29 MED ORDER — AMOXICILLIN-POT CLAVULANATE 875-125 MG PO TABS: 1.0000 | ORAL_TABLET | Freq: Two times a day (BID) | ORAL | Status: DC

## 2015-05-29 NOTE — Progress Notes (Signed)
[place] @ [facility] [phone]  Fax:([phone] [place]: [birth year]  MR#: [id number]  YKZ#:[id number]  Patient Care Team: Kirk Ruths, MD as PCP - General (Internal Medicine)  CHIEF COMPLAINT:  Chief Complaint  Patient presents with  . Breast Cancer    Oncology History   1.  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  2.  Estrogen receptor positive more than 95%.  3.  Progesterone receptor positive more than 95%.  4.  HER-2/neu 1+. FISH study negative.  5.  Started on chemotherapy with Epirubicin and Taxotere in June of 2002 as neoadjuvant treatment.  6.  Surgery with mastectomy in September of 2002.  Residual carcinoma involving skin and nipple.  Seven out of fourteen lymph nodes are positive.  7.  Started on radiation therapy to the chest wall in November of 2002.  8.  Finished radiation therapy on December 31, 2000.  Received 5,040 cGy in 28 fractions.  9.Femara has been discontinued in December of 2012 9.  Arimidex 1 tablet daily completed Sept. 2007.  10. Extended adjuvant hormonal therapy with Letrozole initiated 10/07. 11. Antihormone therapy discontinued  12lymphedema of left upper extremity.  Recurrent cellulitis      Carcinoma of left breast ([place])   05/25/2000 Initial Diagnosis Carcinoma of left breast    No flowsheet data found.  INTERVAL HISTORY: 82 year old lady came today further follow-up regarding inflammatory carcinoma of breast.  Patient continues to a joint pains knee pain and back pain and difficulty in ambulation.  Left upper extremity lymphedema and recurrent cellulitis however patient is taking Augmentin at the first sign of redness and has prevented hospitalization.  Patient is getting physiotherapy and lymphedema therapy.  Here for further follow-up and treatment consideration . Patient is inflammatory carcinoma of breast and recurrent cellulitis associated with lymphedema. Has some  problem with difficulty maintaining balance and patient was evaluated by a neurologist and MRI scan of brain has been ordered. Here for further follow-up and treatment consideration   REVIEW OF SYSTEMS:   \General status: Ambulating with the help of walker.  Performance status is declining to 1.  HEENT: No headache no dizziness.  No soreness in the mouth. Lungs: Shortness of breath on exertion.  Cough.  No hemoptysis. Cardiac: No chest pain.  No palpitation. GI: No nausea.  No vomiting.  No diarrhea.  No rectal bleeding.  Musculoskeletal system: Osteoarthritis bony pain joint pains Neurological system: No headache no dizziness.  No other focal symptoms. Musculoskeletal system: Joint pains.  Swelling of the left upper extremity. Skin: No rash. Lower extremity no edema. All other systems have been reviewed  As per HPI. Otherwise, a complete review of systems is negatve.  PAST MEDICAL HISTORY: Past Medical History  Diagnosis Date  . Lymphedema   . Arthritis     osteoarthritis  . Anxiety   . Sleep apnea   . Breast cancer ([place]) 2002    Left  chemo/radistion  . Carcinoma of left breast ([place])     PAST SURGICAL HISTORY: Past Surgical History  Procedure Laterality Date  . Eye surgery    . Mastectomy Left 2002  . Breast biopsy Right 1980    FAMILY HISTORY Family History  Problem Relation Age of Onset  . Breast cancer Maternal Aunt   . Breast cancer Cousin   . Breast cancer Cousin     GYNECOLOGIC HISTORY:  No LMP recorded. Patient is postmenopausal.  ADVANCED DIRECTIVES:  Patient does have advance Directive   HEALTH MAINTENANCE: Social History  Substance Use Topics  . Smoking status: Never Smoker   . Smokeless tobacco: None  . Alcohol Use: No      Allergies  Allergen Reactions  . Codeine     Other reaction(s): Unknown    Current Outpatient Prescriptions  Medication Sig Dispense Refill  . co-enzyme Q-10 30 MG capsule Take 100 mg by mouth 3 (three) times  daily.    . pregabalin (LYRICA) 50 MG capsule Take 50 mg by mouth 2 (two) times daily. Reported on 05/29/2015    . Probiotic Product ([place]) Take by mouth.    . Calcium Carbonate-Vitamin D 600-400 MG-UNIT per tablet Take 1 tablet by mouth daily. Reported on 05/29/2015    . diclofenac sodium (VOLTAREN) 1 % GEL Apply 2 g topically 4 (four) times daily. Reported on 05/29/2015    . esomeprazole (NEXIUM) 20 MG capsule Take 20 mg by mouth daily. Reported on 05/29/2015    . fexofenadine-pseudoephedrine (ALLEGRA-D 24) 180-240 MG per 24 hr tablet Take 1 tablet by mouth daily. Reported on 05/29/2015    . furosemide (LASIX) 40 MG tablet Take 40 mg by mouth daily. Reported on 05/29/2015    . glimepiride (AMARYL) 4 MG tablet Take 4 mg by mouth daily with breakfast. Reported on 05/29/2015    . losartan (COZAAR) 100 MG tablet Take 100 mg by mouth daily. Reported on 05/29/2015    . magnesium oxide (MAG-OX) 400 MG tablet Take 400 mg by mouth daily. Reported on 05/29/2015    . PARoxetine (PAXIL-CR) 25 MG 24 hr tablet Take 25 mg by mouth daily. Reported on 05/29/2015    . saxagliptin HCl (ONGLYZA) 2.5 MG TABS tablet Take 2.5 mg by mouth daily. Reported on 05/29/2015    . vitamin B-12 (CYANOCOBALAMIN) 250 MCG tablet Take 250 mcg by mouth daily. Reported on 05/29/2015    . vitamin C (ASCORBIC ACID) 500 MG tablet Take 500 mg by mouth daily. Reported on 05/29/2015    . Vitamin D, Ergocalciferol, (DRISDOL) 50000 UNITS CAPS capsule Take 50,000 Units by mouth every 7 (seven) days. Reported on 05/29/2015     No current facility-administered medications for this visit.    OBJECTIVE:  Filed Vitals:   05/29/15 1054  BP: 163/91  Pulse: 73  Temp: 96.7 F (35.9 C)  Resp: 18     Body mass index is 37.86 kg/(m^2).    ECOG FS:1 - Symptomatic but completely ambulatory  PHYSICAL EXAM: Goal status: Performance status is good.  Patient has not lost significant weight HEENT: No evidence of stomatitis.   Sclera and  conjunctivae :: No jaundice.   pale looking . Lungs: Air  entry equal on both sides.  No rhonchi.  No rales.  Cardiac: Heart sounds are normal.  No pericardial rub.  No murmur. Lymphatic system: Cervical, axillary, inguinal, lymph nodes not palpable GI: Abdomen is soft.  No ascites.  Liver spleen not palpable.  No tenderness.  Bowel sounds are within normal limit Lower extremity: No edema.  Left upper extremity lymphedema unchanged. Neurological system: Higher functions, cranial nerves intact No evidence of peripheral neuropathy. Skin: No rash.  No ecchymosis.. Breasts: Right breast free of masses.  Left chest wall area no evidence of recurrent disease. :   LAB RESULTS:  Appointment on 05/29/2015  Component Date Value Ref Range Status  . WBC 05/29/2015 6.0  3.6 - 11.0 K/uL Final  . RBC 05/29/2015  4.05  3.80 - 5.20 MIL/uL Final  . Hemoglobin 05/29/2015 12.7  12.0 - 16.0 g/dL Final  . HCT 05/29/2015 38.2  35.0 - 47.0 % Final  . MCV 05/29/2015 94.3  80.0 - 100.0 fL Final  . MCH 05/29/2015 31.3  26.0 - 34.0 pg Final  . MCHC 05/29/2015 33.2  32.0 - 36.0 g/dL Final  . RDW 05/29/2015 13.5  11.5 - 14.5 % Final  . Platelets 05/29/2015 188  150 - 440 K/uL Final  . Neutrophils Relative % 05/29/2015 62   Final  . Neutro Abs 05/29/2015 3.7  1.4 - 6.5 K/uL Final  . Lymphocytes Relative 05/29/2015 26   Final  . Lymphs Abs 05/29/2015 1.5  1.0 - 3.6 K/uL Final  . Monocytes Relative 05/29/2015 9   Final  . Monocytes Absolute 05/29/2015 0.6  0.2 - 0.9 K/uL Final  . Eosinophils Relative 05/29/2015 2   Final  . Eosinophils Absolute 05/29/2015 0.1  0 - 0.7 K/uL Final  . Basophils Relative 05/29/2015 1   Final  . Basophils Absolute 05/29/2015 0.1  0 - 0.1 K/uL Final  . Sodium 05/29/2015 133* 135 - 145 mmol/L Final  . Potassium 05/29/2015 4.2  3.5 - 5.1 mmol/L Final  . Chloride 05/29/2015 103  101 - 111 mmol/L Final  . CO2 05/29/2015 24  22 - 32 mmol/L Final  . Glucose, Bld 05/29/2015 219* 65 - 99  mg/dL Final  . BUN 05/29/2015 21* 6 - 20 mg/dL Final  . Creatinine, Ser 05/29/2015 0.82  0.44 - 1.00 mg/dL Final  . Calcium 05/29/2015 8.8* 8.9 - 10.3 mg/dL Final  . Total Protein 05/29/2015 7.1  6.5 - 8.1 g/dL Final  . Albumin 05/29/2015 3.8  3.5 - 5.0 g/dL Final  . AST 05/29/2015 21  15 - 41 U/L Final  . ALT 05/29/2015 15  14 - 54 U/L Final  . Alkaline Phosphatase 05/29/2015 70  38 - 126 U/L Final  . Total Bilirubin 05/29/2015 0.6  0.3 - 1.2 mg/dL Final  . GFR calc non Af Amer 05/29/2015 >60  >60 mL/min Final  . GFR calc Af Amer 05/29/2015 >60  >60 mL/min Final   Comment: (NOTE) The eGFR has been calculated using the CKD EPI equation. This calculation has not been validated in all clinical situations. eGFR's persistently <60 mL/min signify possible Chronic Kidney Disease.   . Anion gap 05/29/2015 6  5 - 15 Final    Lab Results  Component Value Date   LABCA2 15.5 05/21/2014     STUDIES: IMPRESSION: No mammographic evidence of malignancy. A result letter of this screening mammogram will be mailed directly to the patient.  RECOMMENDATION: Screening mammogram in one year. (Code:[id number]-B-01Y)  BI-RADS CATEGORY 1: Negative(In May of 2017)  MRI scan of brain was essentially normal with cerebral atrophy which is age related ASSESSMENT: 82 year old lady with stage IV carcinoma breast on clinical ground there is no evidence of recurrent disease Recurrent cellulitis with lymphedema in left upper extremity Osteoarthritis and declining performance status  MEDICAL DECISION MAKING:  All lab data has been reviewed. Overall declining performance status due to multiple medical issues There is no evidence of recurrent disease Lymphedema left upper extremity with recurrent cellulitis unchanged   Patient is a recurrent cellulitis of the left upper extremity and responds well to Augmentin Patient was given a prescription for antibiotics to be started as soon as the first sign of  cellulitis. Continue follow-up  Patient expressed understanding and was in agreement with this  plan. She also understands that She can call clinic at any time with any questions, concerns, or complaints.    Carcinoma of left breast   Staging form: Breast, AJCC 7th Edition     Clinical: Stage IIIB (T4d, N1, M0) - Unsigned   Forest Gleason, MD   05/29/2015 11:00 AM

## 2015-05-30 ENCOUNTER — Ambulatory Visit
Admission: RE | Admit: 2015-05-30 | Discharge: 2015-05-30 | Disposition: A | Discharge status: Home / Self Care | Payer: Medicare Other | Source: Ambulatory Visit | Attending: Neurology | Admitting: Neurology

## 2015-05-30 DIAGNOSIS — R296 Repeated falls: Secondary | ICD-10-CM | POA: Diagnosis present

## 2015-05-30 DIAGNOSIS — G252 Other specified forms of tremor: Secondary | ICD-10-CM | POA: Diagnosis present

## 2015-05-30 DIAGNOSIS — I6782 Cerebral ischemia: Secondary | ICD-10-CM | POA: Diagnosis not present

## 2015-05-30 DIAGNOSIS — G319 Degenerative disease of nervous system, unspecified: Secondary | ICD-10-CM | POA: Insufficient documentation

## 2015-06-01 ENCOUNTER — Encounter: Payer: Self-pay | Admitting: Oncology

## 2015-07-30 DIAGNOSIS — Z0000 Encounter for general adult medical examination without abnormal findings: Secondary | ICD-10-CM | POA: Insufficient documentation

## 2015-08-02 ENCOUNTER — Ambulatory Visit: Payer: Medicare Other | Attending: Unknown Physician Specialty

## 2015-08-02 VITALS — BP 155/76 | HR 81

## 2015-08-02 DIAGNOSIS — R42 Dizziness and giddiness: Secondary | ICD-10-CM | POA: Diagnosis present

## 2015-08-02 DIAGNOSIS — R262 Difficulty in walking, not elsewhere classified: Secondary | ICD-10-CM | POA: Diagnosis present

## 2015-08-02 DIAGNOSIS — M6281 Muscle weakness (generalized): Secondary | ICD-10-CM | POA: Diagnosis present

## 2015-08-02 NOTE — Therapy (Signed)
[place] MAIN [facility] SERVICES [address] [place], [place], [address] Phone: [phone]   Fax:  [phone]  Physical Therapy Evaluation  Patient Details  Name: Janet Elliott MRN: [id number] Date of Birth: 11-Mar-1932 Referring Provider: Beverly Gust  Encounter Date: 08/02/2015      PT End of Session - 08/02/15 1158    Visit Number 1   Number of Visits 17   Date for PT Re-Evaluation 10-03-15   Authorization Type g codes   PT Start Time 0945   PT Stop Time 1050   PT Time Calculation (min) 65 min   Equipment Utilized During Treatment Gait belt   Activity Tolerance Patient tolerated treatment well   Behavior During Therapy WFL for tasks assessed/performed      Past Medical History:  Diagnosis Date  . Anxiety   . Arthritis    osteoarthritis  . Breast cancer ([place]) 2002   Left  chemo/radistion  . Carcinoma of left breast ([place])   . Lymphedema   . Sleep apnea     Past Surgical History:  Procedure Laterality Date  . BREAST BIOPSY Right 1980  . EYE SURGERY    . MASTECTOMY Left 2002    Vitals:   08/02/15 0950  BP: (!) 155/76  Pulse: 81  SpO2: 100%         Subjective Assessment - 08/02/15 0951    Subjective Imbalance   Pertinent History Pt reports that this past year she got hearing aids and while she was with the audiologist she reported she was having some dizziness. Audiologist recommended she see the ENT whom she saw 07/02/15. She reports that the ENT told her that her dizziness may be related to her inner ear function. No VNG testing reported however ENT note states that Dix-Hallpike test was negative. She was referred for vestibular therapy from ENT for dizziness. ENT note describes symptoms as pt feeling "off balance." Pt states that her primary concern is her balance currently. She is fearful of falling and would like to stop her "wobbling." She has a history of some dizziness a few weeks ago and attributes it to quick  changes in position however has not recently had any issues. She does endorse a few bouts of vertigo since 10/2012 but not currently. Pt had spinal injections yesterday and denies any back pain currently. Pt has a tremor and follows with neurology. 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 He believes that the etiology is cerebellar tremors but essential tremors cannot be rulted out.     Diagnostic tests Brain MRI: Cerebral atrophy and chronic microvascular ischemia. No acute changes   Patient Stated Goals Improve balance and decrease fall risk   Currently in Pain? No/denies            [facility] PT Assessment - 08/02/15 1015      Assessment   Medical Diagnosis Dizziness   Referring Provider Beverly Gust   Onset Date/Surgical Date 07/02/15   Hand Dominance Right   Next MD Visit No appointments scheduled with Dr. Tami Ribas   Prior Therapy Yes, PT at [facility]     Precautions   Precautions None     Restrictions   Weight Bearing Restrictions No     Balance Screen   Has the patient fallen in the past 6 months Yes   How many times? 2   Has the patient had a decrease in activity level because of a fear of falling?  Yes   Is the  patient reluctant to leave their home because of a fear of falling?  Yes     [place] residence   Living Arrangements Other relatives   Available Help at Discharge Family   Type of [place] to enter   Entrance Stairs-Number of Steps [place] One level   [place] - 2 wheels     Prior Function   Level of Independence Independent with basic ADLs;Needs assistance with homemaking   Leisure Travel     Cognition   Overall Cognitive Status Within Functional Limits for tasks assessed     Sensation   Additional Comments Pt reports history of bilateral LE neurpathy     Standardized Balance Assessment   Standardized Balance  Assessment Berg Balance Test;Timed Up and Go Test;Five Times Sit to Stand;10 meter walk test   Five times sit to stand comments  31.98   10 Meter Walk 18.65 seconds = 0.54 m/s     Berg Balance Test   Sit to Stand Able to stand without using hands and stabilize independently   Standing Unsupported Able to stand safely 2 minutes   Sitting with Back Unsupported but Feet Supported on Floor or Stool Able to sit safely and securely 2 minutes   Stand to Sit Controls descent by using hands   Transfers Able to transfer safely, definite need of hands   Standing Unsupported with Eyes Closed Able to stand 10 seconds safely   Standing Ubsupported with Feet Together Able to place feet together independently and stand 1 minute safely   From Standing, Reach Forward with Outstretched Arm Can reach forward >12 cm safely (5")   From Standing Position, Pick up Object from Floor Able to pick up shoe, needs supervision   From Standing Position, Turn to Look Behind Over each Shoulder Looks behind from both sides and weight shifts well   Turn 360 Degrees Able to turn 360 degrees safely but slowly   Standing Unsupported, Alternately Place Feet on Step/Stool Able to complete >2 steps/needs minimal assist   Standing Unsupported, One Foot in Front Able to plae foot ahead of the other independently and hold 30 seconds   Standing on One Leg Tries to lift leg/unable to hold 3 seconds but remains standing independently   Total Score 43     Timed Up and Go Test   TUG Normal TUG   Normal TUG (seconds) 32.89           VESTIBULAR  EVALUATION  Onset Date: Years  Deferred dermatome testing as pt is under the care of neurology.  SOMATOSENSORY:  Any N & T in extremities or weakness: Pt reports history of neuropathy in feet and hands      COORDINATION: Finger to Nose: Mildly dysmetric bilaterally, worse on R  Past Pointing:  Normal Pronator Drift: mild positive LUE   MUSCULOSKELETAL SCREEN: Cervical Spine  ROM: WFL and painless for age with flexion, extension, and rotation   ROM: Grossly WFL throughout  MMT: General deconditioning and weakness evident in UE/LE but no focal weakness with MMT.   Functional Mobility:  Gait: Scanning of visual environment with gait is: diminished. Pt ambulates with shuffling gait and decreased toe to floor clearance. She reports infrequently tripping over her feet. Decreased speed. Pt uses rolling walker for ambulation.   OCULOMOTOR / VESTIBULAR TESTING:  Oculomotor Exam- Room Light  Normal Abnormal Comments  Ocular  Alignment N    Ocular ROM N    Spontaneous Nystagmus N    End-Gaze Nystagmus N    Smooth Pursuit  A Very saccadic  Saccades  A Multiple attempts to reach target  VOR  A Pt reports feeling "woozy"  VOR Cancellation  A Pt reports feeling "woozy"  Left Head Thrust   Pt unable to relax enough to attempt  Right Head Thrust   Pt unable to relax enough to attempt  Head Shaking Nystagmus     Static Acuity     Dynamic Acuity       Oculomotor Exam- Fixation Suppressed  Normal Abnormal Comments  Ocular Alignment N    Ocular ROM N    Spontaneous Nystagmus N    End-Gaze Nystagmus N    Left Head Thrust     Right Head Thrust     Head Shaking Nystagmus N      BPPV Testing: Deferred on this date as it was negative at ENT office.       TREATMENT  NEUROMUSCULAR RE-EDUCATION. Pt issued and performed VOR x 1 horizontal with therapist. Written handout provided with verbal and tactile feedback for proper technique. Pt struggles to perform VOR x 1 correctly.                             PT Education - 08/02/15 1158    Education provided Yes   Education Details HEP, plan of care   Person(s) Educated Patient   Methods Explanation;Demonstration;Tactile cues;Verbal cues;Handout   Comprehension Verbalized understanding;Verbal cues required;Tactile cues required;Need further instruction             PT Long Term Goals -  08/02/15 1204      PT LONG TERM GOAL #1   Title Patient will be independent in home exercise program to improve strength/mobility and balance for better functional independence and decrease fall risk   Time 8   Period Weeks   Status New     PT LONG TERM GOAL #2   Title Patient will decrease five times sit to stand test by at least 5 seconds indicating an increased LE strength and improved balance   Baseline 08/02/15: 31.98 seconds   Time 8   Period Weeks   Status New     PT LONG TERM GOAL #3   Title Patient will decrease normal TUG test by at least 5 seconds indicating an increased LE strength and improved balance   Baseline 08/02/15: 32.89 seconds   Time 8   Period Weeks   Status New     PT LONG TERM GOAL #4   Title Patient will increase 10 meter walk test by at least 0.13 m/s in order to improve gait speed for better community ambulation and to reduce fall risk   Baseline 08/02/15: 0.54 m/s   Time 8   Period Weeks   Status New               Plan - 08/02/15 1159    Clinical Impression Statement Pt is a pleasant 82 yo female referred by ENT for dizziness which pt describes as "imbalance." Pt states she has had dizziness in the past but currently her main concern is her balance. Pt would like to decrease her risk for falls in order to improve function at home and when out in the community. Vestibular evaluation today reveals mostly central signs for symptoms except for possible positive VOR testing. Gait  speed is decreased at 0.54 m/s which is below adequate speed for community ambulation. TUG is 32.89 and 5TSTS is 31.98 both of which inidicate pt is at increased risk for falls. BERG is 43/56 which also places pt in high risk category for falls. Pt will benefit from skilled PT services to address deficits in balance, strength, and possible vestibular function in order to decrease risk for falls and improve function at home.   Rehab Potential Fair   Clinical Impairments Affecting  Rehab Potential Positive: motivation; Negative: chronicity, age, and central signs   PT Frequency 2x / week   PT Duration 8 weeks   PT Treatment/Interventions ADLs/Self Care Home Management;Aquatic Therapy;Canalith Repostioning;Gait training;Stair training;Functional mobility training;Therapeutic activities;Therapeutic exercise;Balance training;Neuromuscular re-education;Patient/family education;Manual techniques;Vestibular   PT Next Visit Plan Review VOR, issue balance HEP, progress LE strength and balance exercises   PT Home Exercise Plan VOR x 1 horizontal in sitting, 1 minute x 3, 4 times/day   Consulted and Agree with Plan of Care Patient      Patient will benefit from skilled therapeutic intervention in order to improve the following deficits and impairments:  Abnormal gait, Decreased balance, Dizziness, Decreased strength, Decreased mobility  Visit Diagnosis: Difficulty in walking, not elsewhere classified - Plan: PT plan of care cert/re-cert  Muscle weakness (generalized) - Plan: PT plan of care cert/re-cert  Dizziness and giddiness - Plan: PT plan of care cert/re-cert      G-Codes - 123456 1207-04-25    Functional Assessment Tool Used clinical judgement, BERG, 5TSTS, TUG   Functional Limitation Mobility: Walking and moving around   Mobility: Walking and Moving Around Current Status [phone]) At least 60 percent but less than 80 percent impaired, limited or restricted   Mobility: Walking and Moving Around Goal Status [phone]) At least 40 percent but less than 60 percent impaired, limited or restricted       Problem List Patient Active Problem List   Diagnosis Date Noted  . Carcinoma of left breast ([place]) 05/26/2014  . Adult BMI 30+ 03/20/2014  . Morbid obesity ([place]) 03/20/2014  . Arthritis of knee, degenerative 10/24/2013  . Focal lymphocytic colitis 10/11/2013  . Lymphocytic colitis 10/11/2013  . Impingement syndrome of shoulder 09/05/2013  . Impingement syndrome of left  shoulder 09/05/2013  . Combined fat and carbohydrate induced hyperlipemia 07/08/2013  . Benign hypertension 07/08/2013  . Diabetes mellitus, type 2 ([place]) 07/08/2013  . Type 2 diabetes mellitus ([place]) 07/08/2013  . Addison anemia 07/08/2013  . Bulge of lumbar disc without myelopathy 05/22/2013   Phillips Grout PT, DPT   Chananya Canizalez 08/02/2015, 12:15 PM  [place] MAIN [facility] SERVICES [address] [place], [place], [address] Phone: [phone]   Fax:  [phone]  Name: Janet Elliott MRN: [id number] Date of Birth: 03-14-1932

## 2015-08-09 ENCOUNTER — Ambulatory Visit: Payer: Medicare Other | Admitting: Physical Therapy

## 2015-08-16 ENCOUNTER — Ambulatory Visit: Payer: Medicare Other | Attending: Unknown Physician Specialty | Admitting: Physical Therapy

## 2015-08-16 ENCOUNTER — Encounter: Payer: Self-pay | Admitting: Physical Therapy

## 2015-08-16 DIAGNOSIS — R262 Difficulty in walking, not elsewhere classified: Secondary | ICD-10-CM | POA: Diagnosis present

## 2015-08-16 DIAGNOSIS — R42 Dizziness and giddiness: Secondary | ICD-10-CM | POA: Insufficient documentation

## 2015-08-16 DIAGNOSIS — M6281 Muscle weakness (generalized): Secondary | ICD-10-CM | POA: Diagnosis present

## 2015-08-16 NOTE — Therapy (Signed)
[place] MAIN [facility] SERVICES [address] [place], [place], [address] Phone: [phone]   Fax:  [phone]  Physical Therapy Treatment  Patient Details  Name: Janet Elliott MRN: [id number] Date of Birth: 18-May-1932 Referring Provider: Beverly Gust  Encounter Date: 08/16/2015      PT End of Session - 08/16/15 1035    Visit Number 2   Number of Visits 17   Date for PT Re-Evaluation 10-02-15   Authorization Type g codes   PT Start Time 0919   PT Stop Time 1013   PT Time Calculation (min) 54 min   Equipment Utilized During Treatment Gait belt   Activity Tolerance Patient tolerated treatment well   Behavior During Therapy WFL for tasks assessed/performed      Past Medical History:  Diagnosis Date  . Anxiety   . Arthritis    osteoarthritis  . Breast cancer ([place]) 04-10-2000   Left  chemo/radistion  . Carcinoma of left breast ([place])   . Lymphedema   . Sleep apnea     Past Surgical History:  Procedure Laterality Date  . BREAST BIOPSY Right 1980  . EYE SURGERY    . MASTECTOMY Left Apr 10, 2000    There were no vitals filed for this visit.      Subjective Assessment - 08/16/15 1032    Subjective pt states she was sick last week and was unable to come to therapy. Pt states she is feeling better but has had a "drippy nose" which she states is from allergies. Pt states she has not been doing her exercise much because she said the target is blurry in her left eye at rest in general.    Pertinent History Pt reports that this past year she got hearing aids and while she was with the audiologist she reported she was having some dizziness. Audiologist recommended she see the ENT whom she saw 07/02/15. She reports that the ENT told her that her dizziness may be related to her inner ear function. No VNG testing reported however ENT note states that Dix-Hallpike test was negative. She was referred for vestibular therapy from ENT for dizziness. ENT note  describes symptoms as pt feeling "off balance." Pt states that her primary concern is her balance currently. She is fearful of falling and would like to stop her "wobbling." She has a history of some dizziness a few weeks ago and attributes it to quick changes in position however has not recently had any issues. She does endorse a few bouts of vertigo since 10/2012 but not currently. Pt had spinal injections yesterday and denies any back pain currently. Pt has a tremor and follows with neurology. 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 He believes that the etiology is cerebellar tremors but essential tremors cannot be rulted out.     Diagnostic tests Brain MRI: Cerebral atrophy and chronic microvascular ischemia. No acute changes   Patient Stated Goals Improve balance and decrease fall risk   Currently in Pain? No/denies      Neuromuscular Re-education:  VOR exercise: In sitting, pt performed VOR X 1 horiz 3 reps of 1 minute each. Pt required verbal and tactile cuing for technique for amount of head excursion, pace and fluidity of movement.  Pt reported 5/10 dizziness with this activity.  Airex pad: On Firm and then on foam surface, performed narrow BOS secondary to patient's knees touching and unable to get feet together so feet about 1" apart with and without horiz head  turns and body turns and semi-tandem progressions with alternate lead leg forward with and without horiz head turns and body turns.   On firm surface: On firm surface, patient performed slow marching with 5 second holds 10 reps with pt progressing from B UE support to one hand support to few fingers of right hand touching // bar for support. Pt unable to perform activity without few fingers support for balance at this time.   Discussed and demonstrated performing exercises standing in corner for safety and patient returned demonstration and verbalized understanding.        PT Education - 08/16/15  1034    Education provided Yes   Education Details Reviewed VOR exercise and added addtional exercises to HEP.    Person(s) Educated Patient   Methods Explanation;Demonstration;Tactile cues;Verbal cues;Handout   Comprehension Verbalized understanding;Returned demonstration;Verbal cues required             PT Long Term Goals - 08/02/15 1204      PT LONG TERM GOAL #1   Title Patient will be independent in home exercise program to improve strength/mobility and balance for better functional independence and decrease fall risk   Time 8   Period Weeks   Status New     PT LONG TERM GOAL #2   Title Patient will decrease five times sit to stand test by at least 5 seconds indicating an increased LE strength and improved balance   Baseline 08/02/15: 31.98 seconds   Time 8   Period Weeks   Status New     PT LONG TERM GOAL #3   Title Patient will decrease normal TUG test by at least 5 seconds indicating an increased LE strength and improved balance   Baseline 08/02/15: 32.89 seconds   Time 8   Period Weeks   Status New     PT LONG TERM GOAL #4   Title Patient will increase 10 meter walk test by at least 0.13 m/s in order to improve gait speed for better community ambulation and to reduce fall risk   Baseline 08/02/15: 0.54 m/s   Time 8   Period Weeks   Status New               Plan - 08/16/15 1036    Clinical Impression Statement Patient reports that she has not been consistently doing her home exercise program. Reviewed program, discussed importance of HEP and discussed safety precautions. Patient challenged by narrow BOS, uneven surfaces and activities with head and body turns. Patient continues to require vc with VOR exercise for technique at this time. Patient would benefit from continued PT services to address deficits, try to decrease her falls risk and improve her balance.    Rehab Potential Fair   Clinical Impairments Affecting Rehab Potential Positive: motivation;  Negative: chronicity, age, and central signs   PT Frequency 2x / week   PT Duration 8 weeks   PT Treatment/Interventions ADLs/Self Care Home Management;Aquatic Therapy;Canalith Repostioning;Gait training;Stair training;Functional mobility training;Therapeutic activities;Therapeutic exercise;Balance training;Neuromuscular re-education;Patient/family education;Manual techniques;Vestibular   PT Next Visit Plan Continued reinforcement and review of HEP; progressions of balance exercises and begin LE strengthening exercises.    PT Home Exercise Plan VOR x 1 horizontal in sitting, 1 minute x 3, 4 times/day; feet together and semi-tandem progressions with horiz head turns and body turns, slow marching;    Consulted and Agree with Plan of Care Patient      Patient will benefit from skilled therapeutic intervention in order to improve the  following deficits and impairments:  Abnormal gait, Decreased balance, Dizziness, Decreased strength, Decreased mobility  Visit Diagnosis: Difficulty in walking, not elsewhere classified  Muscle weakness (generalized)  Dizziness and giddiness     Problem List Patient Active Problem List   Diagnosis Date Noted  . Carcinoma of left breast ([place]) 05/26/2014  . Adult BMI 30+ 03/20/2014  . Morbid obesity ([place]) 03/20/2014  . Arthritis of knee, degenerative 10/24/2013  . Focal lymphocytic colitis 10/11/2013  . Lymphocytic colitis 10/11/2013  . Impingement syndrome of shoulder 09/05/2013  . Impingement syndrome of left shoulder 09/05/2013  . Combined fat and carbohydrate induced hyperlipemia 07/08/2013  . Benign hypertension 07/08/2013  . Diabetes mellitus, type 2 ([place]) 07/08/2013  . Type 2 diabetes mellitus ([place]) 07/08/2013  . Addison anemia 07/08/2013  . Bulge of lumbar disc without myelopathy 05/22/2013   Lady Deutscher PT, DPT Lady Deutscher 08/16/2015, 2:10 PM  [place] MAIN [facility] SERVICES [address]  [place], [place], [address] Phone: [phone]   Fax:  [phone]  Name: Janet Elliott MRN: [id number] Date of Birth: 08/17/1932

## 2015-08-20 ENCOUNTER — Encounter: Payer: Self-pay | Admitting: Physical Therapy

## 2015-08-20 ENCOUNTER — Ambulatory Visit: Payer: Medicare Other | Admitting: Physical Therapy

## 2015-08-20 DIAGNOSIS — R262 Difficulty in walking, not elsewhere classified: Secondary | ICD-10-CM

## 2015-08-20 DIAGNOSIS — M6281 Muscle weakness (generalized): Secondary | ICD-10-CM

## 2015-08-20 DIAGNOSIS — R42 Dizziness and giddiness: Secondary | ICD-10-CM

## 2015-08-21 NOTE — Therapy (Signed)
[place] MAIN [facility] SERVICES [address] [place], [place], [address] Phone: [phone]   Fax:  [phone]  Physical Therapy Treatment  Patient Details  Name: Janet Elliott MRN: [id number] Date of Birth: 11-08-1932 Referring Provider: Beverly Gust  Encounter Date: 08/20/2015      PT End of Session - 08/20/15 1028    Visit Number 3   Number of Visits 17   Date for PT Re-Evaluation 23-Oct-2015   Authorization Type g codes   PT Start Time 05-01-28   PT Stop Time 1118   PT Time Calculation (min) 48 min   Equipment Utilized During Treatment Gait belt   Activity Tolerance Patient tolerated treatment well   Behavior During Therapy WFL for tasks assessed/performed      Past Medical History:  Diagnosis Date  . Anxiety   . Arthritis    osteoarthritis  . Breast cancer ([place]) 01-May-2000   Left  chemo/radistion  . Carcinoma of left breast ([place])   . Lymphedema   . Sleep apnea     Past Surgical History:  Procedure Laterality Date  . BREAST BIOPSY Right 1980  . EYE SURGERY    . MASTECTOMY Left 01-May-2000    There were no vitals filed for this visit.      Subjective Assessment - 08/20/15 1039    Subjective Pt states she tried the exercises at home but states the ones on the pillow felt too unsteady. Discussed doing on solid ground and modifying to make more challenging. Pt states she did not get to exercise as much as she would have liked this past week, but did do several times she states.    Pertinent History Pt reports that this past year she got hearing aids and while she was with the audiologist she reported she was having some dizziness. Audiologist recommended she see the ENT whom she saw 07/02/15. She reports that the ENT told her that her dizziness may be related to her inner ear function. No VNG testing reported however ENT note states that Dix-Hallpike test was negative. She was referred for vestibular therapy from ENT for dizziness. ENT note  describes symptoms as pt feeling "off balance." Pt states that her primary concern is her balance currently. She is fearful of falling and would like to stop her "wobbling." She has a history of some dizziness a few weeks ago and attributes it to quick changes in position however has not recently had any issues. She does endorse a few bouts of vertigo since 10/2012 but not currently. Pt had spinal injections yesterday and denies any back pain currently. Pt has a tremor and follows with neurology. 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 He believes that the etiology is cerebellar tremors but essential tremors cannot be rulted out.     Diagnostic tests Brain MRI: Cerebral atrophy and chronic microvascular ischemia. No acute changes   Patient Stated Goals Improve balance and decrease fall risk   Currently in Pain? No/denies      Neuromuscular Re-education:  Time spent with patient discussing home exercise program and re-educated patient as to standing in corner with chair in front for safety while performing HEP. Demonstrated for patient and then patient return demonstrated. On firm surface and then on pillow, performed narrow BOS and semi-tandem progressions with alternate lead leg with and without horiz and body turns with CGA. Performed 2 sets of 10 reps mini-squats on firm surface with vc to stand erect and for technique.  Performed  1 set of 20 reps toe raises using few fingers of one hand for support as needed for balance on // bar during activity. Performed sit to stand without UE support 10 reps and then 5 reps secondary to fatigue.    VOR exercise:  In standing on firm surface, pt performed VOR X 1 horiz 3 reps of 1 minute each. Pt required mod vc for technique as patient turning whole body instead of turning only head and for amount of excursion and speed. Pt performs at slow pace and reports blurring of target with increased speeds.       PT Education - 08/21/15  1702    Education provided Yes   Education Details Practice HEP in corner for safety and discussed HEP   Person(s) Educated Patient   Methods Explanation;Demonstration;Verbal cues   Comprehension Verbalized understanding;Returned demonstration;Verbal cues required             PT Long Term Goals - 08/02/15 1204      PT LONG TERM GOAL #1   Title Patient will be independent in home exercise program to improve strength/mobility and balance for better functional independence and decrease fall risk   Time 8   Period Weeks   Status New     PT LONG TERM GOAL #2   Title Patient will decrease five times sit to stand test by at least 5 seconds indicating an increased LE strength and improved balance   Baseline 08/02/15: 31.98 seconds   Time 8   Period Weeks   Status New     PT LONG TERM GOAL #3   Title Patient will decrease normal TUG test by at least 5 seconds indicating an increased LE strength and improved balance   Baseline 08/02/15: 32.89 seconds   Time 8   Period Weeks   Status New     PT LONG TERM GOAL #4   Title Patient will increase 10 meter walk test by at least 0.13 m/s in order to improve gait speed for better community ambulation and to reduce fall risk   Baseline 08/02/15: 0.54 m/s   Time 8   Period Weeks   Status New               Plan - 08/22/15 1631    Clinical Impression Statement Patient needed re-education as to home exercise program and how to perform safely at home. Patient continues to have blurring of target with VOR exercise and needed vc for technique. Will plan on continuing to review VOR and advance exercise as able. Pt able to perform sit to stand exercise without use of UE but has dificulty with controlling eccentric descent and fatigues quickly with this activity. Patient would benefit from continued PT services to continue to address functional deficits. Encourage patient to continue to follow up as indicated.    Rehab Potential Fair   Clinical  Impairments Affecting Rehab Potential Positive: motivation; Negative: chronicity, age, and central signs   PT Frequency 2x / week   PT Duration 8 weeks   PT Treatment/Interventions ADLs/Self Care Home Management;Aquatic Therapy;Canalith Repostioning;Gait training;Stair training;Functional mobility training;Therapeutic activities;Therapeutic exercise;Balance training;Neuromuscular re-education;Patient/family education;Manual techniques;Vestibular   PT Next Visit Plan Continued reinforcement and review of HEP; progressions of balance exercises and begin LE strengthening exercises.    PT Home Exercise Plan VOR x 1 horizontal in sitting, 1 minute x 3, 4 times/day; feet together and semi-tandem progressions with horiz head turns and body turns, slow marching;    Consulted and Agree with  Plan of Care Patient      Patient will benefit from skilled therapeutic intervention in order to improve the following deficits and impairments:  Abnormal gait, Decreased balance, Dizziness, Decreased strength, Decreased mobility  Visit Diagnosis: Difficulty in walking, not elsewhere classified  Muscle weakness (generalized)  Dizziness and giddiness     Problem List Patient Active Problem List   Diagnosis Date Noted  . Carcinoma of left breast ([place]) 05/26/2014  . Adult BMI 30+ 03/20/2014  . Morbid obesity ([place]) 03/20/2014  . Arthritis of knee, degenerative 10/24/2013  . Focal lymphocytic colitis 10/11/2013  . Lymphocytic colitis 10/11/2013  . Impingement syndrome of shoulder 09/05/2013  . Impingement syndrome of left shoulder 09/05/2013  . Combined fat and carbohydrate induced hyperlipemia 07/08/2013  . Benign hypertension 07/08/2013  . Diabetes mellitus, type 2 ([place]) 07/08/2013  . Type 2 diabetes mellitus ([place]) 07/08/2013  . Addison anemia 07/08/2013  . Bulge of lumbar disc without myelopathy 05/22/2013   Lady Deutscher PT, DPT Lady Deutscher 08/21/2015, 5:04 PM  [place] MAIN [facility] SERVICES [address] [place], [place], [address] Phone: [phone]   Fax:  [phone]  Name: KANDANCE CHAVARIA MRN: [id number] Date of Birth: Aug 21, 1932

## 2015-08-30 ENCOUNTER — Encounter: Payer: Medicare Other | Admitting: Physical Therapy

## 2015-09-03 ENCOUNTER — Ambulatory Visit: Payer: Medicare Other | Admitting: Physical Therapy

## 2015-09-03 VITALS — BP 143/83 | HR 88

## 2015-09-03 DIAGNOSIS — R262 Difficulty in walking, not elsewhere classified: Secondary | ICD-10-CM

## 2015-09-03 DIAGNOSIS — R42 Dizziness and giddiness: Secondary | ICD-10-CM

## 2015-09-03 DIAGNOSIS — M6281 Muscle weakness (generalized): Secondary | ICD-10-CM

## 2015-09-03 NOTE — Therapy (Signed)
[place] MAIN [facility] SERVICES [address] [place], [place], [address] Phone: [phone]   Fax:  [phone]  Physical Therapy Treatment  Patient Details  Name: Janet Elliott MRN: [id number] Date of Birth: 1932-05-17 Referring Provider: Beverly Gust  Encounter Date: 09/03/2015      PT End of Session - 09/03/15 0903    Visit Number 4   Number of Visits 17   Date for PT Re-Evaluation 2015-10-16   Authorization Type g codes   PT Start Time 0803   PT Stop Time 0845   PT Time Calculation (min) 42 min   Equipment Utilized During Treatment Gait belt   Activity Tolerance Patient tolerated treatment well   Behavior During Therapy WFL for tasks assessed/performed      Past Medical History:  Diagnosis Date  . Anxiety   . Arthritis    osteoarthritis  . Breast cancer ([place]) 04-24-00   Left  chemo/radistion  . Carcinoma of left breast ([place])   . Lymphedema   . Sleep apnea     Past Surgical History:  Procedure Laterality Date  . BREAST BIOPSY Right 1980  . EYE SURGERY    . MASTECTOMY Left 04-24-00    Vitals:   09/03/15 0815  BP: (!) 143/83  Pulse: 88  SpO2: 97%        Subjective Assessment - 09/03/15 0902    Subjective Pt reports she has not been consistently doing her HEP as she has been out of town.  Reports unsteadiness with exercise on pillow at home and we discussed the importance that her home exercises are designed to challenge her. Believes that therapy is helping.    Pertinent History Pt reports that this past year she got hearing aids and while she was with the audiologist she reported she was having some dizziness. Audiologist recommended she see the ENT whom she saw 07/02/15. She reports that the ENT told her that her dizziness may be related to her inner ear function. No VNG testing reported however ENT note states that Dix-Hallpike test was negative. She was referred for vestibular therapy from ENT for dizziness. ENT note  describes symptoms as pt feeling "off balance." Pt states that her primary concern is her balance currently. She is fearful of falling and would like to stop her "wobbling." She has a history of some dizziness a few weeks ago and attributes it to quick changes in position however has not recently had any issues. She does endorse a few bouts of vertigo since 10/2012 but not currently. Pt had spinal injections yesterday and denies any back pain currently. Pt has a tremor and follows with neurology. 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 He believes that the etiology is cerebellar tremors but essential tremors cannot be rulted out.     Diagnostic tests Brain MRI: Cerebral atrophy and chronic microvascular ischemia. No acute changes   Patient Stated Goals Improve balance and decrease fall risk   Currently in Pain? No/denies         TREATMENT   Therapeutic Exercise/Gait Training:  Sit<>stand 2x5, pt has to push up with hands on thighs  Toe taps up to 6" step x2 minutes  Step ups to 4" step x2 minutes  Sideways and backward walking with Bil>1 UE supported on // bar x 3 lengths each.   NMR:  Tandem stance x3 each foot with no UE support and CGA  Rhomberg stance on airex 3x45 seconds without UE support, requiring up to  min assist to steady           PT Education - 09/03/15 0903    Education provided Yes   Education Details Exercise technique, purpose of HEP   Person(s) Educated Patient   Methods Explanation   Comprehension Verbalized understanding             PT Long Term Goals - 08/02/15 1204      PT LONG TERM GOAL #1   Title Patient will be independent in home exercise program to improve strength/mobility and balance for better functional independence and decrease fall risk   Time 8   Period Weeks   Status New     PT LONG TERM GOAL #2   Title Patient will decrease five times sit to stand test by at least 5 seconds indicating an increased LE  strength and improved balance   Baseline 08/02/15: 31.98 seconds   Time 8   Period Weeks   Status New     PT LONG TERM GOAL #3   Title Patient will decrease normal TUG test by at least 5 seconds indicating an increased LE strength and improved balance   Baseline 08/02/15: 32.89 seconds   Time 8   Period Weeks   Status New     PT LONG TERM GOAL #4   Title Patient will increase 10 meter walk test by at least 0.13 m/s in order to improve gait speed for better community ambulation and to reduce fall risk   Baseline 08/02/15: 0.54 m/s   Time 8   Period Weeks   Status New               Plan - 09/03/15 0904    Clinical Impression Statement Pt needed education on purspose of HEP and encouraged to continue as given.  Today's session focused on balance interventions and pt required cues tor technique and challenged to decrease UE supported.  Pt will benefit from continued skilled PT intervention to improve balance, functional endurance, and strength.   Rehab Potential Fair   Clinical Impairments Affecting Rehab Potential Positive: motivation; Negative: chronicity, age, and central signs   PT Frequency 2x / week   PT Duration 8 weeks   PT Treatment/Interventions ADLs/Self Care Home Management;Aquatic Therapy;Canalith Repostioning;Gait training;Stair training;Functional mobility training;Therapeutic activities;Therapeutic exercise;Balance training;Neuromuscular re-education;Patient/family education;Manual techniques;Vestibular   PT Next Visit Plan Continued reinforcement and review of HEP; introduce LE strengthening exercises   PT Home Exercise Plan VOR x 1 horizontal in sitting, 1 minute x 3, 4 times/day; feet together and semi-tandem progressions with horiz head turns and body turns, slow marching;    Consulted and Agree with Plan of Care Patient      Patient will benefit from skilled therapeutic intervention in order to improve the following deficits and impairments:  Abnormal gait,  Decreased balance, Dizziness, Decreased strength, Decreased mobility  Visit Diagnosis: Difficulty in walking, not elsewhere classified  Muscle weakness (generalized)  Dizziness and giddiness     Problem List Patient Active Problem List   Diagnosis Date Noted  . Carcinoma of left breast ([place]) 05/26/2014  . Adult BMI 30+ 03/20/2014  . Morbid obesity ([place]) 03/20/2014  . Arthritis of knee, degenerative 10/24/2013  . Focal lymphocytic colitis 10/11/2013  . Lymphocytic colitis 10/11/2013  . Impingement syndrome of shoulder 09/05/2013  . Impingement syndrome of left shoulder 09/05/2013  . Combined fat and carbohydrate induced hyperlipemia 07/08/2013  . Benign hypertension 07/08/2013  . Diabetes mellitus, type 2 ([place]) 07/08/2013  . Type 2 diabetes  mellitus ([place]) 07/08/2013  . Addison anemia 07/08/2013  . Bulge of lumbar disc without myelopathy 05/22/2013     Collie Siad PT, DPT  09/03/2015, 9:14 AM  [place] MAIN [facility] SERVICES [address] [place], [place], [address] Phone: [phone]   Fax:  [phone]  Name: Janet Elliott MRN: [id number] Date of Birth: 09/17/1932

## 2015-09-06 ENCOUNTER — Encounter: Payer: Self-pay | Admitting: Physical Therapy

## 2015-09-06 ENCOUNTER — Ambulatory Visit: Payer: Medicare Other | Attending: Unknown Physician Specialty | Admitting: Physical Therapy

## 2015-09-06 DIAGNOSIS — R262 Difficulty in walking, not elsewhere classified: Secondary | ICD-10-CM | POA: Insufficient documentation

## 2015-09-06 DIAGNOSIS — M6281 Muscle weakness (generalized): Secondary | ICD-10-CM | POA: Diagnosis present

## 2015-09-06 DIAGNOSIS — R42 Dizziness and giddiness: Secondary | ICD-10-CM | POA: Diagnosis present

## 2015-09-06 NOTE — Therapy (Signed)
[place] MAIN [facility] SERVICES [address] [place], [place], [address] Phone: [phone]   Fax:  [phone]  Physical Therapy Treatment  Patient Details  Name: Janet Elliott MRN: [id number] Date of Birth: 1932-11-17 Referring Provider: Beverly Gust  Encounter Date: 09/06/2015      PT End of Session - 09/06/15 1039    Visit Number 5   Number of Visits 17   Date for PT Re-Evaluation 10/17/15   Authorization Type g codes 5/10   PT Start Time 0940   PT Stop Time 1025   PT Time Calculation (min) 45 min   Equipment Utilized During Treatment Gait belt   Activity Tolerance Patient tolerated treatment well   Behavior During Therapy WFL for tasks assessed/performed      Past Medical History:  Diagnosis Date  . Anxiety   . Arthritis    osteoarthritis  . Breast cancer ([place]) 2002   Left  chemo/radistion  . Carcinoma of left breast ([place])   . Lymphedema   . Sleep apnea     Past Surgical History:  Procedure Laterality Date  . BREAST BIOPSY Right 1980  . EYE SURGERY    . MASTECTOMY Left 2002    There were no vitals filed for this visit.      Subjective Assessment - 09/06/15 0943    Subjective Patient states she has misplaced her HEP sheet and would like a new one. Also notes having continued unsteadiness with exercise on pillow at home; denies falls. She states she is feeling well today although she's "not awake".    Pertinent History Pt reports that this past year she got hearing aids and while she was with the audiologist she reported she was having some dizziness. Audiologist recommended she see the ENT whom she saw 07/02/15. She reports that the ENT told her that her dizziness may be related to her inner ear function. No VNG testing reported however ENT note states that Dix-Hallpike test was negative. She was referred for vestibular therapy from ENT for dizziness. ENT note describes symptoms as pt feeling "off balance." Pt states that  her primary concern is her balance currently. She is fearful of falling and would like to stop her "wobbling." She has a history of some dizziness a few weeks ago and attributes it to quick changes in position however has not recently had any issues. She does endorse a few bouts of vertigo since 10/2012 but not currently. Pt had spinal injections yesterday and denies any back pain currently. Pt has a tremor and follows with neurology. 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 He believes that the etiology is cerebellar tremors but essential tremors cannot be rulted out.     Diagnostic tests Brain MRI: Cerebral atrophy and chronic microvascular ischemia. No acute changes   Patient Stated Goals Improve balance and decrease fall risk   Currently in Pain? No/denies      Treatment: Forward step ups on 4" box, 2x10 1 HHA, CGA for stability, min VCs to decrease UE support LE taps on dynadisc, x10 1 HHA, CGA, taps on 4" box x8, no HHA, min A for stability, min-mod VCs in order to attempt without UE support, patient demonstrates fear of falling and notes nervousness, difficulty standing on 1 LE. Sit to stand with airex pad to increase chair height, 2x5 no UE use, min VCs to control descent, CGA for safety, patient notes fatigue.  Lateral walking with yellow tband, x2 each direction in // bars,  2 HHA, CGA for safety, min VCs to maintain feet facing forward.  Static stand (2x30s, no HHA, CGA for stability) and very small marches (x8, no HHA, minA) with narrow stance on airex pad, min VCs for how to perform exercise initially Airex pad static stance narrow base with UE reaches 2x4 each UE, no HHA, CGA-minA for stability, min VCs for positioning on mat and using BUE,  Static stance with leading LE on airex pad, CGA-minA for stability, no HHA, 2x20 seconds, min VCs for foot positioning and post leg stability; increased difficulty standing on RLE.    Patient was given a standing or seated  rest break between all sets.                            PT Education - 09/06/15 1038    Education provided Yes   Education Details Re-educated on standing marches in HEP, exercise technique   Person(s) Educated Patient   Methods Explanation;Demonstration   Comprehension Verbalized understanding;Returned demonstration             PT Long Term Goals - 08/02/15 1204      PT LONG TERM GOAL #1   Title Patient will be independent in home exercise program to improve strength/mobility and balance for better functional independence and decrease fall risk   Time 8   Period Weeks   Status New     PT LONG TERM GOAL #2   Title Patient will decrease five times sit to stand test by at least 5 seconds indicating an increased LE strength and improved balance   Baseline 08/02/15: 31.98 seconds   Time 8   Period Weeks   Status New     PT LONG TERM GOAL #3   Title Patient will decrease normal TUG test by at least 5 seconds indicating an increased LE strength and improved balance   Baseline 08/02/15: 32.89 seconds   Time 8   Period Weeks   Status New     PT LONG TERM GOAL #4   Title Patient will increase 10 meter walk test by at least 0.13 m/s in order to improve gait speed for better community ambulation and to reduce fall risk   Baseline 08/02/15: 0.54 m/s   Time 8   Period Weeks   Status New               Plan - 09/06/15 1039    Clinical Impression Statement Patient requires CGA-minA during balance activities today with increased VCs to encourage decrease in UE use during balance training. Patient requires increased rest breaks during the session due to LE fatigue. Patient requires min VCs for appropriate technique and increasing the difficulty. Patient would continue to benefit from skilled PT in order to address balance, functional endurance, and general strength.    Rehab Potential Fair   Clinical Impairments Affecting Rehab Potential Positive:  motivation; Negative: chronicity, age, and central signs   PT Frequency 2x / week   PT Duration 8 weeks   PT Treatment/Interventions ADLs/Self Care Home Management;Aquatic Therapy;Canalith Repostioning;Gait training;Stair training;Functional mobility training;Therapeutic activities;Therapeutic exercise;Balance training;Neuromuscular re-education;Patient/family education;Manual techniques;Vestibular   PT Next Visit Plan Continued reinforcement and review of HEP; introduce LE strengthening exercises   PT Home Exercise Plan VOR x 1 horizontal in sitting, 1 minute x 3, 4 times/day; feet together and semi-tandem progressions with horiz head turns and body turns, slow marching;    Consulted and Agree with Plan of Care  Patient      Patient will benefit from skilled therapeutic intervention in order to improve the following deficits and impairments:  Abnormal gait, Decreased balance, Dizziness, Decreased strength, Decreased mobility  Visit Diagnosis: Difficulty in walking, not elsewhere classified  Muscle weakness (generalized)  Dizziness and giddiness     Problem List Patient Active Problem List   Diagnosis Date Noted  . Carcinoma of left breast ([place]) 05/26/2014  . Adult BMI 30+ 03/20/2014  . Morbid obesity ([place]) 03/20/2014  . Arthritis of knee, degenerative 10/24/2013  . Focal lymphocytic colitis 10/11/2013  . Lymphocytic colitis 10/11/2013  . Impingement syndrome of shoulder 09/05/2013  . Impingement syndrome of left shoulder 09/05/2013  . Combined fat and carbohydrate induced hyperlipemia 07/08/2013  . Benign hypertension 07/08/2013  . Diabetes mellitus, type 2 ([place]) 07/08/2013  . Type 2 diabetes mellitus ([place]) 07/08/2013  . Addison anemia 07/08/2013  . Bulge of lumbar disc without myelopathy 05/22/2013   Tilman Neat, SPT This entire session was performed under direct supervision and direction of a licensed therapist/therapist assistant . I have personally read, edited and  approve of the note as written.  Trotter,Margaret PT, DPT 09/06/2015, 11:16 AM  [place] MAIN [facility] SERVICES [address] [place], [place], [address] Phone: [phone]   Fax:  [phone]  Name: CHARLIA GENNETT MRN: [id number] Date of Birth: Jun 02, 1932

## 2015-09-11 ENCOUNTER — Ambulatory Visit: Payer: Medicare Other | Admitting: Physical Therapy

## 2015-09-11 ENCOUNTER — Encounter: Payer: Self-pay | Admitting: Physical Therapy

## 2015-09-11 DIAGNOSIS — R42 Dizziness and giddiness: Secondary | ICD-10-CM

## 2015-09-11 DIAGNOSIS — R262 Difficulty in walking, not elsewhere classified: Secondary | ICD-10-CM

## 2015-09-11 DIAGNOSIS — M6281 Muscle weakness (generalized): Secondary | ICD-10-CM

## 2015-09-11 NOTE — Therapy (Signed)
[place] MAIN [facility] SERVICES [address] [place], [place], [address] Phone: [phone]   Fax:  [phone]  Physical Therapy Treatment  Patient Details  Name: Janet Elliott MRN: [id number] Date of Birth: 10-17-1932 Referring Provider: Beverly Gust  Encounter Date: 09/11/2015      PT End of Session - 09/11/15 1037    Visit Number 6   Number of Visits 17   Date for PT Re-Evaluation October 04, 2015   Authorization Type g codes 6/10   PT Start Time 0944   PT Stop Time 1032   PT Time Calculation (min) 48 min   Equipment Utilized During Treatment Gait belt   Activity Tolerance Patient tolerated treatment well   Behavior During Therapy WFL for tasks assessed/performed      Past Medical History:  Diagnosis Date  . Anxiety   . Arthritis    osteoarthritis  . Breast cancer ([place]) 2002   Left  chemo/radistion  . Carcinoma of left breast ([place])   . Lymphedema   . Sleep apnea     Past Surgical History:  Procedure Laterality Date  . BREAST BIOPSY Right 1980  . EYE SURGERY    . MASTECTOMY Left 2002    There were no vitals filed for this visit.      Subjective Assessment - 09/11/15 0953    Subjective Patient states she is doing well today with no pain unless she is moving more. She did an old exercises and is a little sore in the medial aspect of her leg; denies falls.    Pertinent History Pt reports that this past year she got hearing aids and while she was with the audiologist she reported she was having some dizziness. Audiologist recommended she see the ENT whom she saw 07/02/15. She reports that the ENT told her that her dizziness may be related to her inner ear function. No VNG testing reported however ENT note states that Dix-Hallpike test was negative. She was referred for vestibular therapy from ENT for dizziness. ENT note describes symptoms as pt feeling "off balance." Pt states that her primary concern is her balance currently. She is  fearful of falling and would like to stop her "wobbling." She has a history of some dizziness a few weeks ago and attributes it to quick changes in position however has not recently had any issues. She does endorse a few bouts of vertigo since 10/2012 but not currently. Pt had spinal injections yesterday and denies any back pain currently. Pt has a tremor and follows with neurology. 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 He believes that the etiology is cerebellar tremors but essential tremors cannot be rulted out.     Diagnostic tests Brain MRI: Cerebral atrophy and chronic microvascular ischemia. No acute changes   Patient Stated Goals Improve balance and decrease fall risk   Currently in Pain? Yes   Pain Score 3    Pain Location Leg   Pain Orientation Medial;Anterior   Pain Descriptors / Indicators Sore      Treatment: NuStep, x4 min., BUE/LE, L0, 2 min history taken, 2 minutes unbilled Static stand feet apart with UE horizontal ball movements; no HHA 2x5, minA for stability, min VCs to turn body and head to grab ball from SPT Feet together with BUE flexion with ball, x10, no HHA CGA-minA for stability, min VCs to move BUE in comfortable range Forward step over, half bolster and dots, minA for stability, 1-2 UE assist, min VCs to decrease  UE support and move closer to object before stepping over.  Seated hip ABD with red tband, x10, min VCs for initial set up, patient notes it is easy Progressed to standing hip ABD with/without red tband, patient unable to maintain hip in correct plane indicating poor glut med strength. Sidelying clam shells to address hip ABD weakness, 2x10 each LE, min VCs for body positioning (initated into HEP). Sit to stands, elevated with thin pillow, 2x6, no HHA, min VCs to increase eccentric control, notes mild increase in LLE.  Patient had reported doing previous stretch from previous physical therapy experience and noted increased soreness.  SPT instructed patient in appropriate way to stretch this way by decreasing the amount of stretch and not pushing into pain; 2x15 sec. Holds.                            PT Education - 09/11/15 1035    Education provided Yes   Education Details HEP progressed, progression of POC   Person(s) Educated Patient;Caregiver(s)   Methods Explanation   Comprehension Verbalized understanding             PT Long Term Goals - 08/02/15 1204      PT LONG TERM GOAL #1   Title Patient will be independent in home exercise program to improve strength/mobility and balance for better functional independence and decrease fall risk   Time 8   Period Weeks   Status New     PT LONG TERM GOAL #2   Title Patient will decrease five times sit to stand test by at least 5 seconds indicating an increased LE strength and improved balance   Baseline 08/02/15: 31.98 seconds   Time 8   Period Weeks   Status New     PT LONG TERM GOAL #3   Title Patient will decrease normal TUG test by at least 5 seconds indicating an increased LE strength and improved balance   Baseline 08/02/15: 32.89 seconds   Time 8   Period Weeks   Status New     PT LONG TERM GOAL #4   Title Patient will increase 10 meter walk test by at least 0.13 m/s in order to improve gait speed for better community ambulation and to reduce fall risk   Baseline 08/02/15: 0.54 m/s   Time 8   Period Weeks   Status New               Plan - 09/11/15 1038    Clinical Impression Statement Patient instructed about transitioning to ForeverFit after therapy instead of doing both at the same time. Throughout session, patient requires min-mod VCs to perform activities appropriately with CGA-minA during balance exercises for stability. During sit to stands with no UE use, patient reports mild L knee pain. She demonstrates weak hip abduction with an inability to differentiate TFL and glut med during standing exercises. Patient would  continue to benefit from skilled PT in order to address LE weakness, balance, and safety with mobility.    Rehab Potential Fair   Clinical Impairments Affecting Rehab Potential Positive: motivation; Negative: chronicity, age, and central signs   PT Frequency 2x / week   PT Duration 8 weeks   PT Treatment/Interventions ADLs/Self Care Home Management;Aquatic Therapy;Canalith Repostioning;Gait training;Stair training;Functional mobility training;Therapeutic activities;Therapeutic exercise;Balance training;Neuromuscular re-education;Patient/family education;Manual techniques;Vestibular   PT Next Visit Plan Continued reinforcement and review of HEP; introduce LE strengthening exercises   PT Home Exercise Plan VOR  x 1 horizontal in sitting, 1 minute x 3, 4 times/day; feet together and semi-tandem progressions with horiz head turns and body turns, slow marching;    Consulted and Agree with Plan of Care Patient      Patient will benefit from skilled therapeutic intervention in order to improve the following deficits and impairments:  Abnormal gait, Decreased balance, Dizziness, Decreased strength, Decreased mobility  Visit Diagnosis: Difficulty in walking, not elsewhere classified  Muscle weakness (generalized)  Dizziness and giddiness     Problem List Patient Active Problem List   Diagnosis Date Noted  . Carcinoma of left breast ([place]) 05/26/2014  . Adult BMI 30+ 03/20/2014  . Morbid obesity ([place]) 03/20/2014  . Arthritis of knee, degenerative 10/24/2013  . Focal lymphocytic colitis 10/11/2013  . Lymphocytic colitis 10/11/2013  . Impingement syndrome of shoulder 09/05/2013  . Impingement syndrome of left shoulder 09/05/2013  . Combined fat and carbohydrate induced hyperlipemia 07/08/2013  . Benign hypertension 07/08/2013  . Diabetes mellitus, type 2 ([place]) 07/08/2013  . Type 2 diabetes mellitus ([place]) 07/08/2013  . Addison anemia 07/08/2013  . Bulge of lumbar disc without myelopathy  05/22/2013   Tilman Neat, SPT This entire session was performed under direct supervision and direction of a licensed therapist/therapist assistant . I have personally read, edited and approve of the note as written.  Trotter,Margaret PT, DPT 09/11/2015, 11:39 AM  [place] MAIN [facility] SERVICES [address] [place], [place], [address] Phone: [phone]   Fax:  [phone]  Name: Janet Elliott MRN: [id number] Date of Birth: [birth year]

## 2015-09-11 NOTE — Patient Instructions (Addendum)
Abduction: Clam (Eccentric) - Side-Lying    Lie on side with knees bent. Lift top knee, keeping feet together. Keep trunk steady. Slowly lower for 3-5 seconds.  10 reps per set, 2 sets per day, 5 days per week.   http://ecce.exer.us/65   Copyright  VHI. All rights reserved.   

## 2015-09-13 ENCOUNTER — Ambulatory Visit: Payer: Medicare Other | Admitting: Physical Therapy

## 2015-09-13 ENCOUNTER — Encounter: Payer: Self-pay | Admitting: Physical Therapy

## 2015-09-13 VITALS — BP 157/90 | HR 86

## 2015-09-13 DIAGNOSIS — M6281 Muscle weakness (generalized): Secondary | ICD-10-CM

## 2015-09-13 DIAGNOSIS — R42 Dizziness and giddiness: Secondary | ICD-10-CM

## 2015-09-13 DIAGNOSIS — R262 Difficulty in walking, not elsewhere classified: Secondary | ICD-10-CM | POA: Diagnosis not present

## 2015-09-13 NOTE — Therapy (Signed)
[place] MAIN [facility] SERVICES [address] [place], [place], [address] Phone: [phone]   Fax:  [phone]  Physical Therapy Treatment  Patient Details  Name: Janet Elliott MRN: [id number] Date of Birth: May 25, 1932 Referring Provider: Beverly Gust  Encounter Date: 09/13/2015      PT End of Session - 09/13/15 1054    Visit Number 7   Number of Visits 17   Date for PT Re-Evaluation 10/16/15   Authorization Type g codes 7/10   PT Start Time 0958   PT Stop Time 1046   PT Time Calculation (min) 48 min   Equipment Utilized During Treatment Gait belt   Activity Tolerance Patient tolerated treatment well   Behavior During Therapy WFL for tasks assessed/performed      Past Medical History:  Diagnosis Date  . Anxiety   . Arthritis    osteoarthritis  . Breast cancer ([place]) May 02, 2000   Left  chemo/radistion  . Carcinoma of left breast ([place])   . Lymphedema   . Sleep apnea     Past Surgical History:  Procedure Laterality Date  . BREAST BIOPSY Right 1980  . EYE SURGERY    . MASTECTOMY Left 02-May-2000    Vitals:   09/13/15 1000  BP: (!) 157/90  Pulse: 86  SpO2: 97%        Subjective Assessment - 09/13/15 1000    Subjective Reports intermittent chronic back pain (5/10) due to HNP.  Pt was unable to complete HEP yesterday as she was busy talking to her cousin who had fallen and broken her arm.  Pt says she is not sure if she if performing clamshell exercise correctly (attempted this morning).     Pertinent History Pt reports that this past year she got hearing aids and while she was with the audiologist she reported she was having some dizziness. Audiologist recommended she see the ENT whom she saw 07/02/15. She reports that the ENT told her that her dizziness may be related to her inner ear function. No VNG testing reported however ENT note states that Dix-Hallpike test was negative. She was referred for vestibular therapy from ENT for  dizziness. ENT note describes symptoms as pt feeling "off balance." Pt states that her primary concern is her balance currently. She is fearful of falling and would like to stop her "wobbling." She has a history of some dizziness a few weeks ago and attributes it to quick changes in position however has not recently had any issues. She does endorse a few bouts of vertigo since 10/2012 but not currently. Pt had spinal injections yesterday and denies any back pain currently. Pt has a tremor and follows with neurology. 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 He believes that the etiology is cerebellar tremors but essential tremors cannot be rulted out.     Diagnostic tests Brain MRI: Cerebral atrophy and chronic microvascular ischemia. No acute changes   Patient Stated Goals Improve balance and decrease fall risk   Pain Score 5    Pain Location Back   Pain Orientation Lower   Pain Descriptors / Indicators Aching;Constant       TREATMENT   Therapeutic Exercise:  Seated hip ABD/ER with RTB, 3x10. Pt reports min pain lateral L knee at end of exercise.  Sidelying clam shells 2x10 each LE, verbal and tactile cues for core activation during exercise to eliminate low back pain.  Sit to stands not using UEs 1x6, pt reports L knee pain. Introduced  RTB around knees and pillow in chair but no improvement in pain. Discontinued exercise.  Step ups to 8" steps (pain in L knee), up to 6" step but discontinued due to L knee pain.   NMR: Tandem standing on airex with eyes closed  Feet together on airex with eyes closed   Weight shifts L and R with wide BOS with UEs supported on // bar, poor recruitment of glute med.         PT Education - 09/13/15 1003    Education provided Yes   Education Details Exercise technique   Person(s) Educated Patient   Methods Explanation;Demonstration;Tactile cues;Verbal cues   Comprehension Verbalized understanding;Returned demonstration              PT Long Term Goals - 08/02/15 1204      PT LONG TERM GOAL #1   Title Patient will be independent in home exercise program to improve strength/mobility and balance for better functional independence and decrease fall risk   Time 8   Period Weeks   Status New     PT LONG TERM GOAL #2   Title Patient will decrease five times sit to stand test by at least 5 seconds indicating an increased LE strength and improved balance   Baseline 08/02/15: 31.98 seconds   Time 8   Period Weeks   Status New     PT LONG TERM GOAL #3   Title Patient will decrease normal TUG test by at least 5 seconds indicating an increased LE strength and improved balance   Baseline 08/02/15: 32.89 seconds   Time 8   Period Weeks   Status New     PT LONG TERM GOAL #4   Title Patient will increase 10 meter walk test by at least 0.13 m/s in order to improve gait speed for better community ambulation and to reduce fall risk   Baseline 08/02/15: 0.54 m/s   Time 8   Period Weeks   Status New               Plan - 09/13/15 1012    Clinical Impression Statement Pt continues to demonstrates fatigue with hip abduction exercise.  She countinues to require moderate verbal and tactile cues for proper technique.  She was limited by L knee pain, pt reports h/o L knee arthritis.  Encouraged pt to complete HEP using her HEP handout for best outcomes.     Rehab Potential Fair   Clinical Impairments Affecting Rehab Potential Positive: motivation; Negative: chronicity, age, and central signs   PT Frequency 2x / week   PT Duration 8 weeks   PT Treatment/Interventions ADLs/Self Care Home Management;Aquatic Therapy;Canalith Repostioning;Gait training;Stair training;Functional mobility training;Therapeutic activities;Therapeutic exercise;Balance training;Neuromuscular re-education;Patient/family education;Manual techniques;Vestibular   PT Next Visit Plan Continued reinforcement and review of HEP; introduce LE  strengthening exercises   PT Home Exercise Plan VOR x 1 horizontal in sitting, 1 minute x 3, 4 times/day; feet together and semi-tandem progressions with horiz head turns and body turns, slow marching;    Consulted and Agree with Plan of Care Patient      Patient will benefit from skilled therapeutic intervention in order to improve the following deficits and impairments:  Abnormal gait, Decreased balance, Dizziness, Decreased strength, Decreased mobility  Visit Diagnosis: Difficulty in walking, not elsewhere classified  Muscle weakness (generalized)  Dizziness and giddiness     Problem List Patient Active Problem List   Diagnosis Date Noted  . Carcinoma of left breast ([place]) 05/26/2014  .  Adult BMI 30+ 03/20/2014  . Morbid obesity ([place]) 03/20/2014  . Arthritis of knee, degenerative 10/24/2013  . Focal lymphocytic colitis 10/11/2013  . Lymphocytic colitis 10/11/2013  . Impingement syndrome of shoulder 09/05/2013  . Impingement syndrome of left shoulder 09/05/2013  . Combined fat and carbohydrate induced hyperlipemia 07/08/2013  . Benign hypertension 07/08/2013  . Diabetes mellitus, type 2 ([place]) 07/08/2013  . Type 2 diabetes mellitus ([place]) 07/08/2013  . Addison anemia 07/08/2013  . Bulge of lumbar disc without myelopathy 05/22/2013    Collie Siad PT, DPT 09/13/2015, 10:57 AM  [place] MAIN [facility] SERVICES [address] [place], [place], [address] Phone: [phone]   Fax:  [phone]  Name: LUCINE HILFIKER MRN: [id number] Date of Birth: 12-04-1932

## 2015-09-17 ENCOUNTER — Encounter: Payer: Self-pay | Admitting: Physical Therapy

## 2015-09-17 ENCOUNTER — Ambulatory Visit: Payer: Medicare Other | Admitting: Physical Therapy

## 2015-09-17 VITALS — BP 164/78 | HR 77

## 2015-09-17 DIAGNOSIS — M6281 Muscle weakness (generalized): Secondary | ICD-10-CM

## 2015-09-17 DIAGNOSIS — R262 Difficulty in walking, not elsewhere classified: Secondary | ICD-10-CM

## 2015-09-17 DIAGNOSIS — R42 Dizziness and giddiness: Secondary | ICD-10-CM

## 2015-09-17 NOTE — Therapy (Signed)
[place] [facility] SERVICES [address] [place], [place], [address] Phone: [phone]   Fax:  [phone]  Physical Therapy Treatment  Patient Details  Name: Janet Elliott MRN: [id number] Date of Birth: 05/05/1932 Referring Provider: Beverly Gust  Encounter Date: 09/17/2015      PT End of Session - 09/17/15 1155    Visit Number 8   Number of Visits 17   Date for PT Re-Evaluation 10/08/15   Authorization Type g codes 8/10   PT Start Time 1101   PT Stop Time 1147   PT Time Calculation (min) 46 min   Equipment Utilized During Treatment Gait belt   Activity Tolerance Patient tolerated treatment well   Behavior During Therapy WFL for tasks assessed/performed      Past Medical History:  Diagnosis Date  . Anxiety   . Arthritis    osteoarthritis  . Breast cancer ([place]) 2002   Left  chemo/radistion  . Carcinoma of left breast ([place])   . Lymphedema   . Sleep apnea     Past Surgical History:  Procedure Laterality Date  . BREAST BIOPSY Right 1980  . EYE SURGERY    . MASTECTOMY Left 2002    Vitals:   09/17/15 1159  BP: (!) 164/78  Pulse: 77        Subjective Assessment - 09/17/15 1106    Subjective Patient reports her back pain is there, but is nothing to complain about.  She notes she has B knee pain.  She denies falls although she says she sometimes has near falls. She notes that her blood sugar has been higher in the 150-70s.    Pertinent History Pt reports that this past year she got hearing aids and while she was with the audiologist she reported she was having some dizziness. Audiologist recommended she see the ENT whom she saw 07/02/15. She reports that the ENT told her that her dizziness may be related to her inner ear function. No VNG testing reported however ENT note states that Dix-Hallpike test was negative. She was referred for vestibular therapy from ENT for dizziness. ENT note describes symptoms as pt feeling "off  balance." Pt states that her primary concern is her balance currently. She is fearful of falling and would like to stop her "wobbling." She has a history of some dizziness a few weeks ago and attributes it to quick changes in position however has not recently had any issues. She does endorse a few bouts of vertigo since 10/2012 but not currently. Pt had spinal injections yesterday and denies any back pain currently. Pt has a tremor and follows with neurology. 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 He believes that the etiology is cerebellar tremors but essential tremors cannot be rulted out.     Diagnostic tests Brain MRI: Cerebral atrophy and chronic microvascular ischemia. No acute changes   Patient Stated Goals Improve balance and decrease fall risk   Currently in Pain? Yes   Pain Score --   Pain Location Knee   Pain Orientation Right;Left   Pain Descriptors / Indicators Aching      Treatment: Standing hip flexion against red tband resistance, 2x20, min VCs for maintaining body positioning.  Weight shifts with wide base of support to recruit glut med, min-mod VCs for correct positioning and performing knee flexion when leaning towards that side. Standing marches on airex pad, 2x10, min VCs to decrease UE support and for continued feet placement for safety, minA for  stability, 0-1 HHA Forward tapping on firm surface, 2x10, min VCs to perform glut max contraction on stance leg for stability, CGA-minA for stability, 0-1 HHA Lateral walking on airex balance beam, x2 each direction, min VCs to increase step length and decrease HHA support, minA for stability. Mini squats, 3x10, 0-1 HHA, mod verbal and tactile cueing in order to perform with appropriate technique and decrease stress placed on knees Forward and backwards walking in // bars, no AD or HHA, min-mod VCs for increasing step length, CGA-minA for stability Static stance on half bolster (flat side up), 2x30 seconds,  mod VCs to stand tall and decrease UE support, minA for stability, 0-1 HHA.   Instructed to ambulate around kitchen table x10 to increase walking endurance. Patient states desire to begin heel raises at home, given instruction for safety during exercise. Patient requires 2-3 seated rest breaks throughout session due to fatigue.                             PT Education - 09/17/15 1154    Education provided Yes   Education Details Walking exercise at home, safety in HEP, heel raises    Person(s) Educated Patient   Methods Explanation   Comprehension Verbalized understanding             PT Long Term Goals - 08/02/15 1204      PT LONG TERM GOAL #1   Title Patient will be independent in home exercise program to improve strength/mobility and balance for better functional independence and decrease fall risk   Time 8   Period Weeks   Status New     PT LONG TERM GOAL #2   Title Patient will decrease five times sit to stand test by at least 5 seconds indicating an increased LE strength and improved balance   Baseline 08/02/15: 31.98 seconds   Time 8   Period Weeks   Status New     PT LONG TERM GOAL #3   Title Patient will decrease normal TUG test by at least 5 seconds indicating an increased LE strength and improved balance   Baseline 08/02/15: 32.89 seconds   Time 8   Period Weeks   Status New     PT LONG TERM GOAL #4   Title Patient will increase 10 meter walk test by at least 0.13 m/s in order to improve gait speed for better community ambulation and to reduce fall risk   Baseline 08/02/15: 0.54 m/s   Time 8   Period Weeks   Status New               Plan - 09/17/15 1155    Clinical Impression Statement Although patient initially reported knee pain today, she states that after exercise they feel better but stiff. Patient requires minA during balance exercises and min-mod VCs to decrease UE support and perform activities with appropriate  technique. She continues to have difficulty in not using UEs for support. Patient would benefit from skilled PT in order to increase BLE strength, balance, and safety with functional mobility.    Rehab Potential Fair   Clinical Impairments Affecting Rehab Potential Positive: motivation; Negative: chronicity, age, and central signs   PT Frequency 2x / week   PT Duration 8 weeks   PT Treatment/Interventions ADLs/Self Care Home Management;Aquatic Therapy;Canalith Repostioning;Gait training;Stair training;Functional mobility training;Therapeutic activities;Therapeutic exercise;Balance training;Neuromuscular re-education;Patient/family education;Manual techniques;Vestibular   PT Next Visit Plan Continued reinforcement and review of  HEP; introduce LE strengthening exercises   PT Home Exercise Plan VOR x 1 horizontal in sitting, 1 minute x 3, 4 times/day; feet together and semi-tandem progressions with horiz head turns and body turns, slow marching;    Consulted and Agree with Plan of Care Patient      Patient will benefit from skilled therapeutic intervention in order to improve the following deficits and impairments:  Abnormal gait, Decreased balance, Dizziness, Decreased strength, Decreased mobility  Visit Diagnosis: Difficulty in walking, not elsewhere classified  Muscle weakness (generalized)  Dizziness and giddiness     Problem List Patient Active Problem List   Diagnosis Date Noted  . Carcinoma of left breast ([place]) 05/26/2014  . Adult BMI 30+ 03/20/2014  . Morbid obesity ([place]) 03/20/2014  . Arthritis of knee, degenerative 10/24/2013  . Focal lymphocytic colitis 10/11/2013  . Lymphocytic colitis 10/11/2013  . Impingement syndrome of shoulder 09/05/2013  . Impingement syndrome of left shoulder 09/05/2013  . Combined fat and carbohydrate induced hyperlipemia 07/08/2013  . Benign hypertension 07/08/2013  . Diabetes mellitus, type 2 ([place]) 07/08/2013  . Type 2 diabetes mellitus ([place])  07/08/2013  . Addison anemia 07/08/2013  . Bulge of lumbar disc without myelopathy 05/22/2013   Tilman Neat, SPT This entire session was performed under direct supervision and direction of a licensed therapist/therapist assistant . I have personally read, edited and approve of the note as written.  Trotter,Margaret PT, DPT 09/18/2015, 8:42 AM  [place] MAIN [facility] SERVICES [address] [place], [place], [address] Phone: [phone]   Fax:  [phone]  Name: Janet Elliott MRN: [id number] Date of Birth: Jun 11, 1932

## 2015-09-19 ENCOUNTER — Ambulatory Visit: Payer: Medicare Other | Admitting: Physical Therapy

## 2015-09-19 ENCOUNTER — Encounter: Payer: Self-pay | Admitting: Physical Therapy

## 2015-09-19 DIAGNOSIS — R42 Dizziness and giddiness: Secondary | ICD-10-CM

## 2015-09-19 DIAGNOSIS — R262 Difficulty in walking, not elsewhere classified: Secondary | ICD-10-CM | POA: Diagnosis not present

## 2015-09-19 DIAGNOSIS — M6281 Muscle weakness (generalized): Secondary | ICD-10-CM

## 2015-09-19 NOTE — Patient Instructions (Addendum)
EXTENSION: Sitting - Resistance Band (Active)    Sit with feet flat. Against yellow resistance band, straighten right knee. Complete _2__ sets of _10__ repetitions. Perform _1-2__ sessions per day.  Copyright  VHI. All rights reserved.  FLEXION: Sitting - Resistance Band (Active)    Sit, both feet flat. Against yellow resistance band, lift right knee toward ceiling. Complete __1-2_ sets of _10__ repetitions. Perform _1-2__ sessions per day.  http://gtsc.exer.us/21   Copyright  VHI. All rights reserved.  ABDUCTION: Sitting - Resistance Band (Active)    Sit with feet flat. Lift right leg slightly and, against yellow resistance band, draw it out to side. Complete _2__ sets of __10_ repetitions. Perform _1-2__ sessions per day.  Copyright  VHI. All rights reserved.

## 2015-09-19 NOTE — Therapy (Signed)
[facility] SERVICES [address] [place], [place], [address] Phone: [phone]   Fax:  [phone]  Physical Therapy Treatment  Patient Details  Name: Janet Elliott MRN: [id number] Date of Birth: Nov 11, 1932 Referring Provider: Beverly Gust  Encounter Date: 09/19/2015      PT End of Session - 09/19/15 1149    Visit Number 9   Number of Visits 17   Date for PT Re-Evaluation 10-04-2015   Authorization Type g codes 09/22/22   PT Start Time 1100   PT Stop Time 1145   PT Time Calculation (min) 45 min   Equipment Utilized During Treatment Gait belt   Activity Tolerance Patient tolerated treatment well   Behavior During Therapy WFL for tasks assessed/performed      Past Medical History:  Diagnosis Date  . Anxiety   . Arthritis    osteoarthritis  . Breast cancer ([place]) 2002   Left  chemo/radistion  . Carcinoma of left breast ([place])   . Lymphedema   . Sleep apnea     Past Surgical History:  Procedure Laterality Date  . BREAST BIOPSY Right 1980  . EYE SURGERY    . MASTECTOMY Left 2002    There were no vitals filed for this visit.      Subjective Assessment - 09/19/15 1104    Subjective (P)  Patient reports taking Lyrica from morning to night time but has had no other med changes. She states she feels tired today from not sleeping well.    Pertinent History (P)  Pt reports that this past year she got hearing aids and while she was with the audiologist she reported she was having some dizziness. Audiologist recommended she see the ENT whom she saw 07/02/15. She reports that the ENT told her that her dizziness may be related to her inner ear function. No VNG testing reported however ENT note states that Dix-Hallpike test was negative. She was referred for vestibular therapy from ENT for dizziness. ENT note describes symptoms as pt feeling "off balance." Pt states that her primary concern is her balance currently. She is fearful of  falling and would like to stop her "wobbling." She has a history of some dizziness a few weeks ago and attributes it to quick changes in position however has not recently had any issues. She does endorse a few bouts of vertigo since 10/2012 but not currently. Pt had spinal injections yesterday and denies any back pain currently. Pt has a tremor and follows with neurology. 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 He believes that the etiology is cerebellar tremors but essential tremors cannot be rulted out.     Diagnostic tests (P)  Brain MRI: Cerebral atrophy and chronic microvascular ischemia. No acute changes   Patient Stated Goals (P)  Improve balance and decrease fall risk      Treatment:  Vitals were assessed at mildly high but WNL for patient, BP performed manually (162/88). Patient denies any symptoms related to BP or cardiac issues such as lightheadedness, dizziness, or pain.  Neuromuscular Re-ed: Static tandem stance on airex pad, x10 vertical head movements, min-modA in order to maintain balance,1 standing rest break between sets, min VCs for decreaused UE support, no HHA Heel raises on airex pad, 2x10, minA for stability, 1 HHA, min VCs to control movement eccentrically.   TherEx: Forward taps onto tband, no UE support, 2x10, min-modA for stability, min VCs to decrease UE when initially starting, requires multiple encouragements to  continue. Forward taps on 4" step, 1 HHA, CGA-minA for stability, min VCs to increase step height on LLE Mini squats, x10, 2 HHA, Mod VCs for technique such as moving hips post. In order to protect B knees, min tactile cues.  Seated therex with red tband (initiated into HEP; handout given): Patient independent donning/doffing, given tband for home use. Min VCs about how to increase/decrease resistance prn. LAQ, x10 each LE; min VCs to perform full knee extension Hip flexion, x10 each LE, min VCs to move forward in chair and  increase range Abduction, x10, min VCs to control eccentric movement                           PT Education - 09/19/15 1148    Education provided Yes   Education Details HEP progressed, continuing walking exercise with RW   Person(s) Educated Patient   Methods Explanation;Handout;Verbal cues   Comprehension Verbalized understanding;Verbal cues required             PT Long Term Goals - 08/02/15 1204      PT LONG TERM GOAL #1   Title Patient will be independent in home exercise program to improve strength/mobility and balance for better functional independence and decrease fall risk   Time 8   Period Weeks   Status New     PT LONG TERM GOAL #2   Title Patient will decrease five times sit to stand test by at least 5 seconds indicating an increased LE strength and improved balance   Baseline 08/02/15: 31.98 seconds   Time 8   Period Weeks   Status New     PT LONG TERM GOAL #3   Title Patient will decrease normal TUG test by at least 5 seconds indicating an increased LE strength and improved balance   Baseline 08/02/15: 32.89 seconds   Time 8   Period Weeks   Status New     PT LONG TERM GOAL #4   Title Patient will increase 10 meter walk test by at least 0.13 m/s in order to improve gait speed for better community ambulation and to reduce fall risk   Baseline 08/02/15: 0.54 m/s   Time 8   Period Weeks   Status New               Plan - 09/19/15 1149    Clinical Impression Statement Patient noted mild fatigue at beginning of therapy session; after the session, she stated she didn't feel quite as tired. Patient requires minA-modA to maintain balance during exercises and min-mod VCs to perform exercises with appropriate form. She requires 1 rest break throughout the session and demonstrates seated HEP well. Patient would continue to benefit from skilled PT in order to address BLE strength, balance, and safety with mobility.    Rehab Potential Fair    Clinical Impairments Affecting Rehab Potential Positive: motivation; Negative: chronicity, age, and central signs   PT Frequency 2x / week   PT Duration 8 weeks   PT Treatment/Interventions ADLs/Self Care Home Management;Aquatic Therapy;Canalith Repostioning;Gait training;Stair training;Functional mobility training;Therapeutic activities;Therapeutic exercise;Balance training;Neuromuscular re-education;Patient/family education;Manual techniques;Vestibular   PT Next Visit Plan Continued reinforcement and review of HEP; introduce LE strengthening exercises   PT Home Exercise Plan VOR x 1 horizontal in sitting, 1 minute x 3, 4 times/day; feet together and semi-tandem progressions with horiz head turns and body turns, slow marching;    Consulted and Agree with Plan of Care Patient  Patient will benefit from skilled therapeutic intervention in order to improve the following deficits and impairments:  Abnormal gait, Decreased balance, Dizziness, Decreased strength, Decreased mobility  Visit Diagnosis: Difficulty in walking, not elsewhere classified  Muscle weakness (generalized)  Dizziness and giddiness     Problem List Patient Active Problem List   Diagnosis Date Noted  . Carcinoma of left breast ([place]) 05/26/2014  . Adult BMI 30+ 03/20/2014  . Morbid obesity ([place]) 03/20/2014  . Arthritis of knee, degenerative 10/24/2013  . Focal lymphocytic colitis 10/11/2013  . Lymphocytic colitis 10/11/2013  . Impingement syndrome of shoulder 09/05/2013  . Impingement syndrome of left shoulder 09/05/2013  . Combined fat and carbohydrate induced hyperlipemia 07/08/2013  . Benign hypertension 07/08/2013  . Diabetes mellitus, type 2 ([place]) 07/08/2013  . Type 2 diabetes mellitus ([place]) 07/08/2013  . Addison anemia 07/08/2013  . Bulge of lumbar disc without myelopathy 05/22/2013   Tilman Neat, SPT This entire session was performed under direct supervision and direction of a licensed  therapist/therapist assistant . I have personally read, edited and approve of the note as written.  Trotter,Margaret PT, DPT 09/19/2015, 1:09 PM  [place] [facility] SERVICES [address] [place], [place], [address] Phone: [phone]   Fax:  [phone]  Name: Janet Elliott MRN: [id number] Date of Birth: 09/14/1932

## 2015-09-24 ENCOUNTER — Encounter: Payer: Self-pay | Admitting: Physical Therapy

## 2015-09-24 ENCOUNTER — Ambulatory Visit: Payer: Medicare Other | Admitting: Physical Therapy

## 2015-09-24 DIAGNOSIS — R262 Difficulty in walking, not elsewhere classified: Secondary | ICD-10-CM

## 2015-09-24 DIAGNOSIS — R42 Dizziness and giddiness: Secondary | ICD-10-CM

## 2015-09-24 DIAGNOSIS — M6281 Muscle weakness (generalized): Secondary | ICD-10-CM

## 2015-09-24 NOTE — Therapy (Addendum)
[place] MAIN [facility] SERVICES [address] [place], [place], [address] Phone: [phone]   Fax:  [phone]  Physical Therapy Treatment/Progress Note  Patient Details  Name: Janet Elliott MRN: [id number] Date of Birth: 14-Mar-1932 Referring Provider: Beverly Gust  Encounter Date: 09/24/2015      PT End of Session - 09/24/15 1157    Visit Number 10   Number of Visits 29   Date for PT Re-Evaluation Nov 17, 2015   Authorization Type g codes 10/10   PT Start Time 1101   PT Stop Time 1147   PT Time Calculation (min) 46 min   Equipment Utilized During Treatment Gait belt   Activity Tolerance Patient tolerated treatment well   Behavior During Therapy WFL for tasks assessed/performed      Past Medical History:  Diagnosis Date  . Anxiety   . Arthritis    osteoarthritis  . Breast cancer ([place]) 2002   Left  chemo/radistion  . Carcinoma of left breast ([place])   . Lymphedema   . Sleep apnea     Past Surgical History:  Procedure Laterality Date  . BREAST BIOPSY Right 1980  . EYE SURGERY    . MASTECTOMY Left 2002    There were no vitals filed for this visit.      Subjective Assessment - 09/24/15 1106    Subjective Patient states she has been having more back pain lately but she is able to get rid of it if she does stretching, etc. She notes she was not able to walk but the new HEP exercises are going well. Patient notes her blood sugar 181, which is a little higher because she didn't eat well yesterday. Denies symptoms.   Pertinent History Pt reports that this past year she got hearing aids and while she was with the audiologist she reported she was having some dizziness. Audiologist recommended she see the ENT whom she saw 07/02/15. She reports that the ENT told her that her dizziness may be related to her inner ear function. No VNG testing reported however ENT note states that Dix-Hallpike test was negative. She was referred for vestibular  therapy from ENT for dizziness. ENT note describes symptoms as pt feeling "off balance." Pt states that her primary concern is her balance currently. She is fearful of falling and would like to stop her "wobbling." She has a history of some dizziness a few weeks ago and attributes it to quick changes in position however has not recently had any issues. She does endorse a few bouts of vertigo since 10/2012 but not currently. Pt had spinal injections yesterday and denies any back pain currently. Pt has a tremor and follows with neurology. 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 He believes that the etiology is cerebellar tremors but essential tremors cannot be rulted out.     Diagnostic tests Brain MRI: Cerebral atrophy and chronic microvascular ischemia. No acute changes   Patient Stated Goals Improve balance and decrease fall risk   Currently in Pain? Yes   Pain Score 1    Pain Location Back   Pain Orientation Lower            [facility] PT Assessment - 09/24/15 0001      Standardized Balance Assessment   Five times sit to stand comments  32.7, 2 HHA, patient reports knee pain that limits her ability to perform activity, >60 y.o. >15 seconds indicates falls risk   10 Meter Walk 20.6 sec=0.49; 16.1sec (brisk pace)=0.62,  close supervision and RW, <1.0 m/s indicates increased risk for falls     Berg Balance Test   Sit to Stand Able to stand  independently using hands   Standing Unsupported Able to stand safely 2 minutes   Sitting with Back Unsupported but Feet Supported on Floor or Stool Able to sit safely and securely 2 minutes   Stand to Sit Sits safely with minimal use of hands   Transfers Able to transfer safely, definite need of hands   Standing Unsupported with Eyes Closed Able to stand 10 seconds safely   Standing Ubsupported with Feet Together Able to place feet together independently and stand 1 minute safely   From Standing, Reach Forward with Outstretched Arm  Can reach forward >12 cm safely (5")   From Standing Position, Pick up Object from Floor Able to pick up shoe, needs supervision   From Standing Position, Turn to Look Behind Over each Shoulder Looks behind from both sides and weight shifts well   Turn 360 Degrees Able to turn 360 degrees safely but slowly   Standing Unsupported, Alternately Place Feet on Step/Stool Able to complete >2 steps/needs minimal assist   Standing Unsupported, One Foot in Front Able to plae foot ahead of the other independently and hold 30 seconds   Standing on One Leg Tries to lift leg/unable to hold 3 seconds but remains standing independently   Total Score 43   Berg comment: <45 inidicates increased risk of falls in 12 months     Timed Up and Go Test   TUG Normal TUG   Normal TUG (seconds) 32.3  >14 seconds=high falls risk       Treatment:  Patient instructed in performing outcome measures and assessment of goals including Berg balance score, 5x sit to stand, 10 meter walk test, and TUG. The patient required CGA-close supervision throughout for safety and min VCs for technique.  Forward taps onto airex pad, 2x10, no HHA, minA for stability, min-mod VCs to not use BUE for support.   Backwards walking, x1 pass in // bars, CGA for safety, min VCs to increase step length and hinge at hips in order to balance weight Forward walking, no UE support, x2 passes in // bars, CGA for safety, min VCs to increase step length.  Standing marches, 2x10, 1 set with red tband resistance, 1 set without, 0-1 HHA, min VCs to decrease UE support and increase BLE range.                       PT Education - 09/24/15 1156    Education provided Yes   Education Details POC, continuing with HEP   Person(s) Educated Patient;Caregiver(s)   Methods Explanation   Comprehension Verbalized understanding             PT Long Term Goals - 09/24/15 1320      PT LONG TERM GOAL #1   Title Patient will be independent  in home exercise program to improve strength/mobility and balance for better functional independence and decrease fall risk   Time 6   Period Weeks   Status On-going     PT LONG TERM GOAL #2   Title Patient will decrease five times sit to stand test by at least 5 seconds indicating an increased LE strength and improved balance   Baseline 08/02/15: 31.98 seconds   Time 6   Period Weeks   Status On-going     PT LONG TERM GOAL #3  Title Patient will decrease normal TUG test by at least 5 seconds indicating an increased LE strength and improved balance   Baseline 08/02/15: 32.89 seconds   Time 6   Period Weeks   Status Partially Met     PT LONG TERM GOAL #4   Title Patient will increase 10 meter walk test by at least 0.13 m/s in order to improve gait speed for better community ambulation and to reduce fall risk   Baseline 08/02/15: 0.54 m/s   Time 6   Period Weeks   Status On-going               Plan - 09/27/15 1158    Clinical Impression Statement Pt's goals and outcome measures were reassessed with little changes in scores. The patient noted she was also having a difficult day with knee pain during the activities, which may contribute to the limited improvement in scores. The patient also missed the first 2 weeks during her 8 week treatment and is deconditioned and will take increased time to make improvements. In the following 6 weeks, the patient will be challenged with balance and strengthening with decreased rest breaks and an increase in HEP. Also different techniques will be used to strength such as LE leg press to decrease BLE knee pain during treatment but make strength gains and will be challenged to not use UEs during balance activities. Patient requires mod VCs to decrease UE support during balance activities and min-mod VCs for technique during exercises. Patient would continue to benefit from skilled PT in order to address BLE strength, balance, endurance, and safety with  mobility.    Rehab Potential Fair   Clinical Impairments Affecting Rehab Potential Positive: motivation; Negative: chronicity, age, and central signs   PT Frequency 2x / week   PT Duration 6 weeks   PT Treatment/Interventions ADLs/Self Care Home Management;Aquatic Therapy;Canalith Repostioning;Gait training;Stair training;Functional mobility training;Therapeutic activities;Therapeutic exercise;Balance training;Neuromuscular re-education;Patient/family education;Manual techniques;Vestibular   PT Next Visit Plan Continued reinforcement and review of HEP; introduce LE strengthening exercises   PT Home Exercise Plan VOR x 1 horizontal in sitting, 1 minute x 3, 4 times/day; feet together and semi-tandem progressions with horiz head turns and body turns, slow marching;    Consulted and Agree with Plan of Care Patient      Patient will benefit from skilled therapeutic intervention in order to improve the following deficits and impairments:  Abnormal gait, Decreased balance, Dizziness, Decreased strength, Decreased mobility  Visit Diagnosis: Difficulty in walking, not elsewhere classified - Plan: PT plan of care cert/re-cert  Muscle weakness (generalized) - Plan: PT plan of care cert/re-cert  Dizziness and giddiness - Plan: PT plan of care cert/re-cert       G-Codes - 09-27-2015 1314    Functional Assessment Tool Used clinical judgement,  5TSTS, TUG   Functional Limitation Mobility: Walking and moving around   Mobility: Walking and Moving Around Current Status (T2671) At least 60 percent but less than 80 percent impaired, limited or restricted   Mobility: Walking and Moving Around Goal Status (I4580) At least 40 percent but less than 60 percent impaired, limited or restricted      Problem List Patient Active Problem List   Diagnosis Date Noted  . Carcinoma of left breast ([place]) 05/26/2014  . Adult BMI 30+ 03/20/2014  . Morbid obesity ([place]) 03/20/2014  . Arthritis of knee, degenerative  10/24/2013  . Focal lymphocytic colitis 10/11/2013  . Lymphocytic colitis 10/11/2013  . Impingement syndrome of shoulder 09/05/2013  .  Impingement syndrome of left shoulder 09/05/2013  . Combined fat and carbohydrate induced hyperlipemia 07/08/2013  . Benign hypertension 07/08/2013  . Diabetes mellitus, type 2 ([place]) 07/08/2013  . Type 2 diabetes mellitus ([place]) 07/08/2013  . Addison anemia 07/08/2013  . Bulge of lumbar disc without myelopathy 05/22/2013   Tilman Neat, SPT This entire session was performed under direct supervision and direction of a licensed therapist/therapist assistant . I have personally read, edited and approve of the note as written.  Trotter,Margaret PT, DPT 09/24/2015, 1:22 PM  [place] MAIN [facility] SERVICES [address] [place], [place], [address] Phone: [phone]   Fax:  [phone]  Name: Janet Elliott MRN: [id number] Date of Birth: 05-Jul-1932

## 2015-09-24 NOTE — Addendum Note (Signed)
Addended by: Blanche East E on: 09/24/2015 01:23 PM   Modules accepted: Orders

## 2015-09-26 ENCOUNTER — Ambulatory Visit: Payer: Medicare Other | Admitting: Physical Therapy

## 2015-09-26 ENCOUNTER — Encounter: Payer: Self-pay | Admitting: Physical Therapy

## 2015-09-26 DIAGNOSIS — R42 Dizziness and giddiness: Secondary | ICD-10-CM

## 2015-09-26 DIAGNOSIS — R262 Difficulty in walking, not elsewhere classified: Secondary | ICD-10-CM | POA: Diagnosis not present

## 2015-09-26 DIAGNOSIS — M6281 Muscle weakness (generalized): Secondary | ICD-10-CM

## 2015-09-26 NOTE — Therapy (Signed)
[place] MAIN [facility] SERVICES [address] [place], [place], [address] Phone: [phone]   Fax:  [phone]  Physical Therapy Treatment  Patient Details  Name: Janet Elliott MRN: [id number] Date of Birth: [birth year] Referring Provider: Beverly Gust  Encounter Date: 09/26/2015      PT End of Session - 09/26/15 1150    Visit Number 11   Number of Visits 29   Date for PT Re-Evaluation 2015-11-24   Authorization Type g codes 1/10   PT Start Time 1058   PT Stop Time 1145   PT Time Calculation (min) 47 min   Equipment Utilized During Treatment Gait belt   Activity Tolerance Patient tolerated treatment well   Behavior During Therapy WFL for tasks assessed/performed      Past Medical History:  Diagnosis Date  . Anxiety   . Arthritis    osteoarthritis  . Breast cancer ([place]) 2002   Left  chemo/radistion  . Carcinoma of left breast ([place])   . Lymphedema   . Sleep apnea     Past Surgical History:  Procedure Laterality Date  . BREAST BIOPSY Right 1980  . EYE SURGERY    . MASTECTOMY Left 2002    There were no vitals filed for this visit.      Subjective Assessment - 09/26/15 1102    Subjective Patient states back pain woke her up but once she repositioned she was able to go back to sleep and stay asleep. She states she feels good overall right now though. Pt states her blood sugars have been slightly high but her MD is aware of it.   Pertinent History Pt reports that this past year she got hearing aids and while she was with the audiologist she reported she was having some dizziness. Audiologist recommended she see the ENT whom she saw 07/02/15. She reports that the ENT told her that her dizziness may be related to her inner ear function. No VNG testing reported however ENT note states that Dix-Hallpike test was negative. She was referred for vestibular therapy from ENT for dizziness. ENT note describes symptoms as pt feeling "off  balance." Pt states that her primary concern is her balance currently. She is fearful of falling and would like to stop her "wobbling." She has a history of some dizziness a few weeks ago and attributes it to quick changes in position however has not recently had any issues. She does endorse a few bouts of vertigo since 10/2012 but not currently. Pt had spinal injections yesterday and denies any back pain currently. Pt has a tremor and follows with neurology. 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 He believes that the etiology is cerebellar tremors but essential tremors cannot be rulted out.     Diagnostic tests Brain MRI: Cerebral atrophy and chronic microvascular ischemia. No acute changes   Patient Stated Goals Improve balance and decrease fall risk   Currently in Pain? No/denies     Treatment:  Warm up on NuStep, L2 x3, BUE/LE, 2 min. Unbilled, 1 min. Of history Heel raises, 3x10, no HHA, CGA for safety, min VCs to control eccentrically Forward BLE taps onto airex pad, 3x10, no HHA, CGA-minA for safety, min VCs to decrease UE support when "cheating" and to increase BLE hip flexion. BLE leg press, x10 at 75#, x10 at 90#, x10 at 110#, min VCs to decrease knee hyperextension and control weight eccentrically. Sit to stands, 3x5 on mat table, BUE on ant. Thighs, min VCs  to perform stand quickly and sit slowly to encourage power and strength.  Lateral walking over yellow tband, attempted x3, patient requires max VCs to continue performing exercise accurately, d/c due to poor patient understanding and not addressing the muscle desired. Lateral walking on airex pad, x4, no HHA, minA-modA, min VCs to increase step length and continue with no HHA.  Instructed to ambulate with no AD, minA, 1 LOB, ~20 feet with turn, min VCs to increase step length and instructed to perform ambulation with rollator at home.                            PT Education - 09/26/15  1149    Education provided Yes   Education Details ambulating at home with AD   Person(s) Educated Patient   Methods Explanation   Comprehension Verbalized understanding             PT Long Term Goals - 09/24/15 1320      PT LONG TERM GOAL #1   Title Patient will be independent in home exercise program to improve strength/mobility and balance for better functional independence and decrease fall risk   Time 6   Period Weeks   Status On-going     PT LONG TERM GOAL #2   Title Patient will decrease five times sit to stand test by at least 5 seconds indicating an increased LE strength and improved balance   Baseline 08/02/15: 31.98 seconds   Time 6   Period Weeks   Status On-going     PT LONG TERM GOAL #3   Title Patient will decrease normal TUG test by at least 5 seconds indicating an increased LE strength and improved balance   Baseline 08/02/15: 32.89 seconds   Time 6   Period Weeks   Status Partially Met     PT LONG TERM GOAL #4   Title Patient will increase 10 meter walk test by at least 0.13 m/s in order to improve gait speed for better community ambulation and to reduce fall risk   Baseline 08/02/15: 0.54 m/s   Time 6   Period Weeks   Status On-going               Plan - 09/26/15 1150    Clinical Impression Statement Pt requires less rest breaks during this session. She requires constant min VCs during balance training to not use UEs for assistance and needs minA-modA for stability/balance. Pt has B knee discomfort during sit<>stand without UE use, but it did not limit her ability to participate. She tolerated progressiong to BLE press well. Patient would continue to benefit from skilled PT in order to address BLE strength, blaance, endurance, and safety with mobility.    Rehab Potential Fair   Clinical Impairments Affecting Rehab Potential Positive: motivation; Negative: chronicity, age, and central signs   PT Frequency 2x / week   PT Duration 6 weeks   PT  Treatment/Interventions ADLs/Self Care Home Management;Aquatic Therapy;Canalith Repostioning;Gait training;Stair training;Functional mobility training;Therapeutic activities;Therapeutic exercise;Balance training;Neuromuscular re-education;Patient/family education;Manual techniques;Vestibular   PT Next Visit Plan Continued reinforcement and review of HEP; introduce LE strengthening exercises   PT Home Exercise Plan VOR x 1 horizontal in sitting, 1 minute x 3, 4 times/day; feet together and semi-tandem progressions with horiz head turns and body turns, slow marching;    Consulted and Agree with Plan of Care Patient      Patient will benefit from skilled therapeutic intervention in  order to improve the following deficits and impairments:  Abnormal gait, Decreased balance, Dizziness, Decreased strength, Decreased mobility  Visit Diagnosis: Difficulty in walking, not elsewhere classified  Muscle weakness (generalized)  Dizziness and giddiness     Problem List Patient Active Problem List   Diagnosis Date Noted  . Carcinoma of left breast ([place]) 05/26/2014  . Adult BMI 30+ 03/20/2014  . Morbid obesity ([place]) 03/20/2014  . Arthritis of knee, degenerative 10/24/2013  . Focal lymphocytic colitis 10/11/2013  . Lymphocytic colitis 10/11/2013  . Impingement syndrome of shoulder 09/05/2013  . Impingement syndrome of left shoulder 09/05/2013  . Combined fat and carbohydrate induced hyperlipemia 07/08/2013  . Benign hypertension 07/08/2013  . Diabetes mellitus, type 2 ([place]) 07/08/2013  . Type 2 diabetes mellitus ([place]) 07/08/2013  . Addison anemia 07/08/2013  . Bulge of lumbar disc without myelopathy 05/22/2013   Tilman Neat, SPT This entire session was performed under direct supervision and direction of a licensed therapist/therapist assistant . I have personally read, edited and approve of the note as written.  Trotter,Margaret PT, DPT 09/26/2015, 4:34 PM  [place] MAIN [facility] SERVICES [address] [place], [place], [address] Phone: [phone]   Fax:  [phone]  Name: Janet Elliott MRN: [id number] Date of Birth: 12/02/1932

## 2015-10-01 ENCOUNTER — Ambulatory Visit: Payer: Medicare Other | Admitting: Physical Therapy

## 2015-10-01 ENCOUNTER — Encounter: Payer: Self-pay | Admitting: Physical Therapy

## 2015-10-01 DIAGNOSIS — M6281 Muscle weakness (generalized): Secondary | ICD-10-CM

## 2015-10-01 DIAGNOSIS — R262 Difficulty in walking, not elsewhere classified: Secondary | ICD-10-CM

## 2015-10-01 DIAGNOSIS — R42 Dizziness and giddiness: Secondary | ICD-10-CM

## 2015-10-01 NOTE — Therapy (Signed)
[place] MAIN [facility] SERVICES [address] [place], [place], [address] Phone: [phone]   Fax:  [phone]  Physical Therapy Treatment  Patient Details  Name: Janet Elliott MRN: [id number] Date of Birth: 1932-08-09 Referring Provider: Beverly Gust  Encounter Date: 10/01/2015      PT End of Session - 10/01/15 1149    Visit Number 12   Number of Visits 29   Date for PT Re-Evaluation December 05, 2015   Authorization Type g codes 03/16/2022   PT Start Time 1100   PT Stop Time 1146   PT Time Calculation (min) 46 min   Equipment Utilized During Treatment Gait belt   Activity Tolerance Patient tolerated treatment well;Patient limited by fatigue   Behavior During Therapy WFL for tasks assessed/performed      Past Medical History:  Diagnosis Date  . Anxiety   . Arthritis    osteoarthritis  . Breast cancer ([place]) 2002   Left  chemo/radistion  . Carcinoma of left breast ([place])   . Lymphedema   . Sleep apnea     Past Surgical History:  Procedure Laterality Date  . BREAST BIOPSY Right 1980  . EYE SURGERY    . MASTECTOMY Left 2002    There were no vitals filed for this visit.      Subjective Assessment - 10/01/15 1103    Subjective Pt states her back pain is continuing to bother her and that she has greater R knee pain/tightness today. Pt states she forgot to check her blood sugars; denies any symptoms related to high blood sugars.    Pertinent History Pt reports that this past year she got hearing aids and while she was with the audiologist she reported she was having some dizziness. Audiologist recommended she see the ENT whom she saw 07/02/15. She reports that the ENT told her that her dizziness may be related to her inner ear function. No VNG testing reported however ENT note states that Dix-Hallpike test was negative. She was referred for vestibular therapy from ENT for dizziness. ENT note describes symptoms as pt feeling "off balance." Pt  states that her primary concern is her balance currently. She is fearful of falling and would like to stop her "wobbling." She has a history of some dizziness a few weeks ago and attributes it to quick changes in position however has not recently had any issues. She does endorse a few bouts of vertigo since 10/2012 but not currently. Pt had spinal injections yesterday and denies any back pain currently. Pt has a tremor and follows with neurology. 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 He believes that the etiology is cerebellar tremors but essential tremors cannot be rulted out.     Diagnostic tests Brain MRI: Cerebral atrophy and chronic microvascular ischemia. No acute changes   Patient Stated Goals Improve balance and decrease fall risk   Pain Score 1    Pain Location Back   Pain Orientation Lower   Pain Descriptors / Indicators Aching   Multiple Pain Sites Yes   Pain Score 1   Pain Location Knee   Pain Orientation Right   Pain Descriptors / Indicators Aching      Treatment: Warm up on NuStep, L2 x3 min. Unbilled Lateral walking red tband, no HAA, x4 in // bars, CGA for safety, min VCs for increased step length Forward step taps, 4" step, 3x8 with no HHA, min VCs to perform with no HHA and rest if form was becoming difficult  Forward step ups, 2x5, first set with 1 HHA, second set no HHA, CGA for safety, min VCs for which LE to perform exercise with. Forward narrow walking, x4 in // bars, CGA-minA for stability, min VCs to perform without HHA and take normal step lengths. Static stand on airex pad with BUE reaches to ball placed in multi planes by SPT, min VCs to turn whole body towards ball. BLE leg press, 110#, 3x10, min VCs for rest breaks and to perform slowly without knee hyperextension Heel raises at // bars, 2x15, no HHA, CGA for safety, min VCs to control eccentrically, patient notes slightly out of breath due to holding her breath during the  activity.                           PT Education - 10/01/15 1149    Education provided Yes   Education Details continuing with exercises   Person(s) Educated Patient   Methods Explanation   Comprehension Verbalized understanding             PT Long Term Goals - 09/24/15 1320      PT LONG TERM GOAL #1   Title Patient will be independent in home exercise program to improve strength/mobility and balance for better functional independence and decrease fall risk   Time 6   Period Weeks   Status On-going     PT LONG TERM GOAL #2   Title Patient will decrease five times sit to stand test by at least 5 seconds indicating an increased LE strength and improved balance   Baseline 08/02/15: 31.98 seconds   Time 6   Period Weeks   Status On-going     PT LONG TERM GOAL #3   Title Patient will decrease normal TUG test by at least 5 seconds indicating an increased LE strength and improved balance   Baseline 08/02/15: 32.89 seconds   Time 6   Period Weeks   Status Partially Met     PT LONG TERM GOAL #4   Title Patient will increase 10 meter walk test by at least 0.13 m/s in order to improve gait speed for better community ambulation and to reduce fall risk   Baseline 08/02/15: 0.54 m/s   Time 6   Period Weeks   Status On-going               Plan - 10/01/15 1150    Clinical Impression Statement Pt reports being fatigued previous to PT today due to performing a lot of housework yesterday. The pt was able to participate in therapy with multiple VCs to decrease UE support. CGA-minA during balance training in order to maintain safety with mobility. Pt has mild increase in B knee pain but reports it's just "sore". Patient would continue to benefit from skilled PT in order to address BLE strength, balance, endurance, and safety with mobility.    Rehab Potential Fair   Clinical Impairments Affecting Rehab Potential Positive: motivation; Negative: chronicity, age,  and central signs   PT Frequency 2x / week   PT Duration 6 weeks   PT Treatment/Interventions ADLs/Self Care Home Management;Aquatic Therapy;Canalith Repostioning;Gait training;Stair training;Functional mobility training;Therapeutic activities;Therapeutic exercise;Balance training;Neuromuscular re-education;Patient/family education;Manual techniques;Vestibular   PT Next Visit Plan Continued reinforcement and review of HEP; introduce LE strengthening exercises   PT Home Exercise Plan VOR x 1 horizontal in sitting, 1 minute x 3, 4 times/day; feet together and semi-tandem progressions with horiz head turns and body turns,  slow marching;    Consulted and Agree with Plan of Care Patient      Patient will benefit from skilled therapeutic intervention in order to improve the following deficits and impairments:  Abnormal gait, Decreased balance, Dizziness, Decreased strength, Decreased mobility  Visit Diagnosis: Difficulty in walking, not elsewhere classified  Muscle weakness (generalized)  Dizziness and giddiness     Problem List Patient Active Problem List   Diagnosis Date Noted  . Carcinoma of left breast ([place]) 05/26/2014  . Adult BMI 30+ 03/20/2014  . Morbid obesity ([place]) 03/20/2014  . Arthritis of knee, degenerative 10/24/2013  . Focal lymphocytic colitis 10/11/2013  . Lymphocytic colitis 10/11/2013  . Impingement syndrome of shoulder 09/05/2013  . Impingement syndrome of left shoulder 09/05/2013  . Combined fat and carbohydrate induced hyperlipemia 07/08/2013  . Benign hypertension 07/08/2013  . Diabetes mellitus, type 2 ([place]) 07/08/2013  . Type 2 diabetes mellitus ([place]) 07/08/2013  . Addison anemia 07/08/2013  . Bulge of lumbar disc without myelopathy 05/22/2013   Tilman Neat, SPT This entire session was performed under direct supervision and direction of a licensed therapist/therapist assistant . I have personally read, edited and approve of the note as  written.  Trotter,Margaret PT, DPT 10/01/2015, 1:21 PM  [place] MAIN [facility] SERVICES [address] [place], [place], [address] Phone: [phone]   Fax:  [phone]  Name: Janet Elliott MRN: [id number] Date of Birth: [birth year]

## 2015-10-03 ENCOUNTER — Ambulatory Visit: Payer: Medicare Other | Admitting: Physical Therapy

## 2015-10-03 ENCOUNTER — Encounter: Payer: Self-pay | Admitting: Physical Therapy

## 2015-10-03 DIAGNOSIS — R42 Dizziness and giddiness: Secondary | ICD-10-CM

## 2015-10-03 DIAGNOSIS — M6281 Muscle weakness (generalized): Secondary | ICD-10-CM

## 2015-10-03 DIAGNOSIS — R262 Difficulty in walking, not elsewhere classified: Secondary | ICD-10-CM | POA: Diagnosis not present

## 2015-10-03 NOTE — Therapy (Signed)
[place] MAIN [facility] SERVICES [address] [place], [place], [address] Phone: [phone]   Fax:  [phone]  Physical Therapy Treatment  Patient Details  Name: Janet Elliott MRN: [id number] Date of Birth: 1932-06-15 Referring Provider: Beverly Gust  Encounter Date: 10/03/2015      PT End of Session - 10/03/15 1150    Visit Number 13   Number of Visits 29   Date for PT Re-Evaluation 20-Nov-2015   Authorization Type g codes 03-30-22   PT Start Time 1101   PT Stop Time 1146   PT Time Calculation (min) 45 min   Equipment Utilized During Treatment Gait belt   Activity Tolerance Patient tolerated treatment well;Patient limited by fatigue;Patient limited by pain   Behavior During Therapy WFL for tasks assessed/performed      Past Medical History:  Diagnosis Date  . Anxiety   . Arthritis    osteoarthritis  . Breast cancer ([place]) 2002   Left  chemo/radistion  . Carcinoma of left breast ([place])   . Lymphedema   . Sleep apnea     Past Surgical History:  Procedure Laterality Date  . BREAST BIOPSY Right 1980  . EYE SURGERY    . MASTECTOMY Left 2002    There were no vitals filed for this visit.      Subjective Assessment - 10/03/15 1106    Subjective Pt states her back is continued to hurt but isn't right now. She states her blood sugars have been higher at 192 but denies any symptoms. Reports increased knee pain/stiffnes. She states she was slightly sore at anterior thighs from last session.   Pertinent History Pt reports that this past year she got hearing aids and while she was with the audiologist she reported she was having some dizziness. Audiologist recommended she see the ENT whom she saw 07/02/15. She reports that the ENT told her that her dizziness may be related to her inner ear function. No VNG testing reported however ENT note states that Dix-Hallpike test was negative. She was referred for vestibular therapy from ENT for  dizziness. ENT note describes symptoms as pt feeling "off balance." Pt states that her primary concern is her balance currently. She is fearful of falling and would like to stop her "wobbling." She has a history of some dizziness a few weeks ago and attributes it to quick changes in position however has not recently had any issues. She does endorse a few bouts of vertigo since 10/2012 but not currently. Pt had spinal injections yesterday and denies any back pain currently. Pt has a tremor and follows with neurology. 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 He believes that the etiology is cerebellar tremors but essential tremors cannot be rulted out.     Diagnostic tests Brain MRI: Cerebral atrophy and chronic microvascular ischemia. No acute changes   Patient Stated Goals Improve balance and decrease fall risk   Currently in Pain? Yes   Pain Score 5    Pain Location Hip   Pain Orientation Left      Treatment:  NuStep, L1 x3 minutes, history taken 2 min; unbilled 1 min.  Forward tapping on 4" step, no HHA, CGA-minA for safety during activity, min VCs for no HHA,  Lateral walking, red tband, x4, 0-1 HHA in // bars, min VCs for foot positioning, patient notes increase L knee pain with exercise. Forward step overs, pool noodle, minA for safety and due to L knee pain, 0-1 HHA, min  VCs to get closer to noodle before stepping over Bridges, 3x10, min VCs for initial set up and to squeeze glut max at top of the exercise Single leg marches, 2x10, min VCs to perform with 1 LE at a time instead of 2, patient states she was told to perform this to decrease her LBP years ago.  Sidelying hip ABD, 3x10, minA from SPT to maintain hips in neutral, min VCs to lift as far as possible and maintain forward hip; poor glut med contraction although improved with cueing.   Patient required multiple rest breaks during standing exercises due to L knee pain.                         PT Education - 10/03/15 1150    Education provided Yes   Education Details bed mobility, HEP progressed   Person(s) Educated Patient   Methods Explanation   Comprehension Verbalized understanding             PT Long Term Goals - 09/24/15 1320      PT LONG TERM GOAL #1   Title Patient will be independent in home exercise program to improve strength/mobility and balance for better functional independence and decrease fall risk   Time 6   Period Weeks   Status On-going     PT LONG TERM GOAL #2   Title Patient will decrease five times sit to stand test by at least 5 seconds indicating an increased LE strength and improved balance   Baseline 08/02/15: 31.98 seconds   Time 6   Period Weeks   Status On-going     PT LONG TERM GOAL #3   Title Patient will decrease normal TUG test by at least 5 seconds indicating an increased LE strength and improved balance   Baseline 08/02/15: 32.89 seconds   Time 6   Period Weeks   Status Partially Met     PT LONG TERM GOAL #4   Title Patient will increase 10 meter walk test by at least 0.13 m/s in order to improve gait speed for better community ambulation and to reduce fall risk   Baseline 08/02/15: 0.54 m/s   Time 6   Period Weeks   Status On-going               Plan - 10/03/15 1151    Clinical Impression Statement Pt reports being fatigued and having L knee pain. During standing activities, she was limited in her ability to perform the exercises well due to this knee pain. She was able to perform mat exercises well with minVCs and tactile cueing. Pt would continue to benefit from skilled PT in order to address BLE weakness, balance, and safety with mobility.    Rehab Potential Fair   Clinical Impairments Affecting Rehab Potential Positive: motivation; Negative: chronicity, age, and central signs   PT Frequency 2x / week   PT Duration 6 weeks   PT Treatment/Interventions ADLs/Self Care Home Management;Aquatic Therapy;Canalith  Repostioning;Gait training;Stair training;Functional mobility training;Therapeutic activities;Therapeutic exercise;Balance training;Neuromuscular re-education;Patient/family education;Manual techniques;Vestibular   PT Next Visit Plan Continued reinforcement and review of HEP; introduce LE strengthening exercises, single leg stance exercises   PT Home Exercise Plan HEP progressed, see patient instructions   Consulted and Agree with Plan of Care Patient      Patient will benefit from skilled therapeutic intervention in order to improve the following deficits and impairments:  Abnormal gait, Decreased balance, Dizziness, Decreased strength, Decreased mobility  Visit  Diagnosis: Difficulty in walking, not elsewhere classified  Muscle weakness (generalized)  Dizziness and giddiness     Problem List Patient Active Problem List   Diagnosis Date Noted  . Carcinoma of left breast ([place]) 05/26/2014  . Adult BMI 30+ 03/20/2014  . Morbid obesity ([place]) 03/20/2014  . Arthritis of knee, degenerative 10/24/2013  . Focal lymphocytic colitis 10/11/2013  . Lymphocytic colitis 10/11/2013  . Impingement syndrome of shoulder 09/05/2013  . Impingement syndrome of left shoulder 09/05/2013  . Combined fat and carbohydrate induced hyperlipemia 07/08/2013  . Benign hypertension 07/08/2013  . Diabetes mellitus, type 2 ([place]) 07/08/2013  . Type 2 diabetes mellitus ([place]) 07/08/2013  . Addison anemia 07/08/2013  . Bulge of lumbar disc without myelopathy 05/22/2013   Tilman Neat, SPT This entire session was performed under direct supervision and direction of a licensed therapist/therapist assistant . I have personally read, edited and approve of the note as written.  Trotter,Margaret PT, DPT 10/03/2015, 3:52 PM  [place] MAIN [facility] SERVICES [address] [place], [place], [address] Phone: [phone]   Fax:  [phone]  Name: Janet Elliott MRN: [id number] Date of  Birth: 1932/02/26

## 2015-10-03 NOTE — Patient Instructions (Addendum)
HIP: Extension, Bridging Bilateral    Place feet on surface. Tighten glutes, raise hips up. _10__ reps per set, _3__ sets per day, _5__ days per week   Copyright  VHI. All rights reserved.  ABDUCTION: Side-Lying (Active)    Lie on left side, top leg straight. Raise top leg as far as possible. Keep hips rolled forward. Complete __2_ sets of _10__ repetitions. Perform _1__ sessions per day.  http://gtsc.exer.us/95   Copyright  VHI. All rights reserved.   If exercises are painful, stop and tell therapist next session.

## 2015-10-07 ENCOUNTER — Encounter: Payer: Self-pay | Admitting: Physical Therapy

## 2015-10-07 ENCOUNTER — Ambulatory Visit: Payer: Medicare Other | Attending: Unknown Physician Specialty | Admitting: Physical Therapy

## 2015-10-07 DIAGNOSIS — R42 Dizziness and giddiness: Secondary | ICD-10-CM | POA: Diagnosis present

## 2015-10-07 DIAGNOSIS — M6281 Muscle weakness (generalized): Secondary | ICD-10-CM | POA: Diagnosis present

## 2015-10-07 DIAGNOSIS — R262 Difficulty in walking, not elsewhere classified: Secondary | ICD-10-CM | POA: Diagnosis present

## 2015-10-07 NOTE — Therapy (Signed)
[place] MAIN [facility] SERVICES [address] [place], [place], [address] Phone: [phone]   Fax:  [phone]  Physical Therapy Treatment  Patient Details  Name: Janet Elliott MRN: [id number] Date of Birth: 1932/03/18 Referring Provider: Beverly Gust  Encounter Date: 10/07/2015      PT End of Session - 10/07/15 1648    Visit Number 14   Number of Visits 29   Date for PT Re-Evaluation 2015-11-22   Authorization Type g codes 02-May-2022   PT Start Time March 25, 1600   PT Stop Time 1645   PT Time Calculation (min) 43 min   Equipment Utilized During Treatment Gait belt   Activity Tolerance Patient tolerated treatment well;Patient limited by pain   Behavior During Therapy WFL for tasks assessed/performed      Past Medical History:  Diagnosis Date  . Anxiety   . Arthritis    osteoarthritis  . Breast cancer ([place]) 03/25/00   Left  chemo/radistion  . Carcinoma of left breast ([place])   . Lymphedema   . Sleep apnea     Past Surgical History:  Procedure Laterality Date  . BREAST BIOPSY Right 1980  . EYE SURGERY    . MASTECTOMY Left March 25, 2000    There were no vitals filed for this visit.      Subjective Assessment - 10/07/15 1609    Subjective Pt states her knees were sore yesterday so she took it easy and now they are feeling better. She states her blood sugar has been better in the 140s. She states she always has some back pain, and she said her knees have swelled slightly which is normal for her.    Pertinent History Pt reports that this past year she got hearing aids and while she was with the audiologist she reported she was having some dizziness. Audiologist recommended she see the ENT whom she saw 07/02/15. She reports that the ENT told her that her dizziness may be related to her inner ear function. No VNG testing reported however ENT note states that Dix-Hallpike test was negative. She was referred for vestibular therapy from ENT for dizziness. ENT note  describes symptoms as pt feeling "off balance." Pt states that her primary concern is her balance currently. She is fearful of falling and would like to stop her "wobbling." She has a history of some dizziness a few weeks ago and attributes it to quick changes in position however has not recently had any issues. She does endorse a few bouts of vertigo since 10/2012 but not currently. Pt had spinal injections yesterday and denies any back pain currently. Pt has a tremor and follows with neurology. 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 He believes that the etiology is cerebellar tremors but essential tremors cannot be rulted out.     Currently in Pain? Yes   Pain Score 3    Pain Location Knee   Pain Orientation Right;Left   Pain Descriptors / Indicators Aching     Treatment: Warm up on NuStep,  L2 x4 min; 2 min of history; 2 min unbilled  TherEx: Lateral and forward step ups, x5 total, d/c due to increase in B knee pain Marches on firm surfaces, 2x10, CGA, min VCs to increase hip ROM Heel raises, 2x10, min VCs for eccentric control and minA for stability, LOB x3 with patient able to regain/minA from SPT, no HHA BLE leg press, 90#, 3x15, min VCs to decrease knee hyperextension and to control the eccentric movement  Neuromuscular Re-ed: Marches on airex pad, x10, CGA-minA for safety, min VCs for foot placement, no HHA 4 square stepping movements, clockwise and counterclockwise direction; x2 each, min-modA, min-mod VCs to increase step distance and for foot placement, no HHA Forward stepping with each LE, x6 total, min VCs to increase step distance and maintain stance leg still; minA-modA for stability during activity, no HHA Patient is nervous during 4 square and forward stepping due to no HHA.                             PT Education - 10/07/15 1647    Education provided Yes   Education Details safety at home, holding therapy if receiving  shots   Person(s) Educated Patient   Methods Explanation   Comprehension Verbalized understanding             PT Long Term Goals - 09/24/15 1320      PT LONG TERM GOAL #1   Title Patient will be independent in home exercise program to improve strength/mobility and balance for better functional independence and decrease fall risk   Time 6   Period Weeks   Status On-going     PT LONG TERM GOAL #2   Title Patient will decrease five times sit to stand test by at least 5 seconds indicating an increased LE strength and improved balance   Baseline 08/02/15: 31.98 seconds   Time 6   Period Weeks   Status On-going     PT LONG TERM GOAL #3   Title Patient will decrease normal TUG test by at least 5 seconds indicating an increased LE strength and improved balance   Baseline 08/02/15: 32.89 seconds   Time 6   Period Weeks   Status Partially Met     PT LONG TERM GOAL #4   Title Patient will increase 10 meter walk test by at least 0.13 m/s in order to improve gait speed for better community ambulation and to reduce fall risk   Baseline 08/02/15: 0.54 m/s   Time 6   Period Weeks   Status On-going               Plan - 10/07/15 1648    Clinical Impression Statement Pt states she feels better this week; however she is still limited in her activities due to her B knee pain. She was instructed that PT would have to wait 48 hours after receiving shots for her knees and back. She requires min-modA during activities for safety and min-mod VCs and demonstration to perform exercises appropriately. Pt would continue to benefit from skilled PT in order to address BLE weakness, balance, adn safety with mobility.   Rehab Potential Fair   Clinical Impairments Affecting Rehab Potential Positive: motivation; Negative: chronicity, age, and central signs   PT Frequency 2x / week   PT Duration 6 weeks   PT Treatment/Interventions ADLs/Self Care Home Management;Aquatic Therapy;Canalith  Repostioning;Gait training;Stair training;Functional mobility training;Therapeutic activities;Therapeutic exercise;Balance training;Neuromuscular re-education;Patient/family education;Manual techniques;Vestibular   PT Next Visit Plan Continued reinforcement and review of HEP; introduce LE strengthening exercises, single leg stance exercises   PT Home Exercise Plan HEP progressed, see patient instructions   Consulted and Agree with Plan of Care Patient      Patient will benefit from skilled therapeutic intervention in order to improve the following deficits and impairments:  Abnormal gait, Decreased balance, Dizziness, Decreased strength, Decreased mobility  Visit Diagnosis: Difficulty in walking, not  elsewhere classified  Muscle weakness (generalized)  Dizziness and giddiness     Problem List Patient Active Problem List   Diagnosis Date Noted  . Carcinoma of left breast ([place]) 05/26/2014  . Adult BMI 30+ 03/20/2014  . Morbid obesity ([place]) 03/20/2014  . Arthritis of knee, degenerative 10/24/2013  . Focal lymphocytic colitis 10/11/2013  . Lymphocytic colitis 10/11/2013  . Impingement syndrome of shoulder 09/05/2013  . Impingement syndrome of left shoulder 09/05/2013  . Combined fat and carbohydrate induced hyperlipemia 07/08/2013  . Benign hypertension 07/08/2013  . Diabetes mellitus, type 2 ([place]) 07/08/2013  . Type 2 diabetes mellitus ([place]) 07/08/2013  . Addison anemia 07/08/2013  . Bulge of lumbar disc without myelopathy 05/22/2013   Tilman Neat, SPT This entire session was performed under direct supervision and direction of a licensed therapist/therapist assistant . I have personally read, edited and approve of the note as written.  Trotter,Margaret PT, DPT 10/08/2015, 9:18 AM  [place] MAIN [facility] SERVICES [address] [place], [place], [address] Phone: [phone]   Fax:  [phone]  Name: Janet Elliott MRN: [id number] Date of  Birth: [birth year]

## 2015-10-09 ENCOUNTER — Ambulatory Visit: Payer: Medicare Other | Admitting: Physical Therapy

## 2015-10-09 ENCOUNTER — Encounter: Payer: Self-pay | Admitting: Physical Therapy

## 2015-10-09 DIAGNOSIS — R42 Dizziness and giddiness: Secondary | ICD-10-CM

## 2015-10-09 DIAGNOSIS — R262 Difficulty in walking, not elsewhere classified: Secondary | ICD-10-CM | POA: Diagnosis not present

## 2015-10-09 DIAGNOSIS — M6281 Muscle weakness (generalized): Secondary | ICD-10-CM

## 2015-10-09 NOTE — Therapy (Signed)
[place] MAIN [facility] SERVICES [address] [place], [place], [address] Phone: [phone]   Fax:  [phone]  Physical Therapy Treatment  Patient Details  Name: Janet Elliott MRN: [id number] Date of Birth: 1932-08-22 Referring Provider: Beverly Gust  Encounter Date: 10/09/2015      PT End of Session - 10/09/15 1520    Visit Number 15   Number of Visits 29   Date for PT Re-Evaluation 2015-11-27   Authorization Type g codes 5/10   PT Start Time 1430   PT Stop Time 1515   PT Time Calculation (min) 45 min   Equipment Utilized During Treatment Gait belt   Activity Tolerance Patient tolerated treatment well;Patient limited by pain   Behavior During Therapy WFL for tasks assessed/performed      Past Medical History:  Diagnosis Date  . Anxiety   . Arthritis    osteoarthritis  . Breast cancer ([place]) 2002   Left  chemo/radistion  . Carcinoma of left breast ([place])   . Lymphedema   . Sleep apnea     Past Surgical History:  Procedure Laterality Date  . BREAST BIOPSY Right 1980  . EYE SURGERY    . MASTECTOMY Left 2002    There were no vitals filed for this visit.      Subjective Assessment - 10/09/15 1436    Subjective Pt states her knees have been really bad, and she will be getting shots next week.  She states her blood sugar has been good in the 130s today which is more normal. She states she has some back pain but the knees have been extremely bad.    Pertinent History Pt reports that this past year she got hearing aids and while she was with the audiologist she reported she was having some dizziness. Audiologist recommended she see the ENT whom she saw 07/02/15. She reports that the ENT told her that her dizziness may be related to her inner ear function. No VNG testing reported however ENT note states that Dix-Hallpike test was negative. She was referred for vestibular therapy from ENT for dizziness. ENT note describes symptoms as pt  feeling "off balance." Pt states that her primary concern is her balance currently. She is fearful of falling and would like to stop her "wobbling." She has a history of some dizziness a few weeks ago and attributes it to quick changes in position however has not recently had any issues. She does endorse a few bouts of vertigo since 10/2012 but not currently. Pt had spinal injections yesterday and denies any back pain currently. Pt has a tremor and follows with neurology. 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 He believes that the etiology is cerebellar tremors but essential tremors cannot be rulted out.     Currently in Pain? Yes   Pain Score 3    Pain Location Knee   Pain Orientation Right;Left   Pain Descriptors / Indicators Aching       Treatment:  Warm up on NuStep, L2 x4 min. BUE/LE; 1 min. History; 3 minutes unbilled Intermittent walking with fast walking for long bouts (~10 meters) and slow for short bouts (5 meters) x2, min VCs to increase step length and pull ankles upwards Forward and lateral taps to dots, 2x10 with each direction and LE, min VC for initial set up, 0-1 HHA, CGA-minA for safety, mild B knee pain every once and a while during exercise Step over multiple tbands, step over step, 4x5 tband  on the ground in // bars, CGA for safety, minA x1 with LOB, 0-1 HHA, min VCs to get closer to object before stepping over. Ant/Post and lateral tilt board, 2x10 ant/post and x30 sec static holds, x10 lateral and x30 sec holds, min VCs for foot placement and to improve forward lean for ant tapping, 0 HHA, CGA for safety BLE leg press, 2x15, 90# (decreased due to B knee pain), min VCs for initial positioning, form has improved with better eccentric control  Patient requires 2-3 rest breaks due to B knee pain.                          PT Education - 10/09/15 1517    Education provided Yes   Education Details safety at home, again holding  therapy for 48 hours after taking shots   Person(s) Educated Patient   Methods Explanation   Comprehension Verbalized understanding             PT Long Term Goals - 09/24/15 1320      PT LONG TERM GOAL #1   Title Patient will be independent in home exercise program to improve strength/mobility and balance for better functional independence and decrease fall risk   Time 6   Period Weeks   Status On-going     PT LONG TERM GOAL #2   Title Patient will decrease five times sit to stand test by at least 5 seconds indicating an increased LE strength and improved balance   Baseline 08/02/15: 31.98 seconds   Time 6   Period Weeks   Status On-going     PT LONG TERM GOAL #3   Title Patient will decrease normal TUG test by at least 5 seconds indicating an increased LE strength and improved balance   Baseline 08/02/15: 32.89 seconds   Time 6   Period Weeks   Status Partially Met     PT LONG TERM GOAL #4   Title Patient will increase 10 meter walk test by at least 0.13 m/s in order to improve gait speed for better community ambulation and to reduce fall risk   Baseline 08/02/15: 0.54 m/s   Time 6   Period Weeks   Status On-going               Plan - 10/09/15 1520    Clinical Impression Statement Pt continues to have B knee pain that is limiting her ability to perform all activities during therapy. She thinks she will be getting shots 10/14. Pt requires CGA-minA during activities for safety. She requires min-mod VCs for appropriate exercise. She states she enjoys the BLE leg press machine and can feel it working her muscles. She continues to demonstrate BLE weakness and balance deficits, which may have a contribution due to pain. Pt would continue to benefit from skilled PT in order to address BLE weakness, balance, and safety with mobility.    Rehab Potential Fair   Clinical Impairments Affecting Rehab Potential Positive: motivation; Negative: chronicity, age, and central signs    PT Frequency 2x / week   PT Duration 6 weeks   PT Treatment/Interventions ADLs/Self Care Home Management;Aquatic Therapy;Canalith Repostioning;Gait training;Stair training;Functional mobility training;Therapeutic activities;Therapeutic exercise;Balance training;Neuromuscular re-education;Patient/family education;Manual techniques;Vestibular   PT Next Visit Plan Continued reinforcement and review of HEP; introduce LE strengthening exercises, single leg stance exercises   PT Home Exercise Plan HEP maintained, see patient instructions   Consulted and Agree with Plan of Care Patient  Patient will benefit from skilled therapeutic intervention in order to improve the following deficits and impairments:  Abnormal gait, Decreased balance, Dizziness, Decreased strength, Decreased mobility  Visit Diagnosis: Difficulty in walking, not elsewhere classified  Muscle weakness (generalized)  Dizziness and giddiness     Problem List Patient Active Problem List   Diagnosis Date Noted  . Carcinoma of left breast ([place]) 05/26/2014  . Adult BMI 30+ 03/20/2014  . Morbid obesity ([place]) 03/20/2014  . Arthritis of knee, degenerative 10/24/2013  . Focal lymphocytic colitis 10/11/2013  . Lymphocytic colitis 10/11/2013  . Impingement syndrome of shoulder 09/05/2013  . Impingement syndrome of left shoulder 09/05/2013  . Combined fat and carbohydrate induced hyperlipemia 07/08/2013  . Benign hypertension 07/08/2013  . Diabetes mellitus, type 2 ([place]) 07/08/2013  . Type 2 diabetes mellitus ([place]) 07/08/2013  . Addison anemia 07/08/2013  . Bulge of lumbar disc without myelopathy 05/22/2013   Tilman Neat, SPT This entire session was performed under direct supervision and direction of a licensed therapist/therapist assistant . I have personally read, edited and approve of the note as written.  Trotter,Margaret PT, DPT 10/10/2015, 8:54 AM  [place] MAIN [facility]  SERVICES [address] [place], [place], [address] Phone: [phone]   Fax:  [phone]  Name: Janet Elliott MRN: [id number] Date of Birth: October 19, 1932

## 2015-10-15 ENCOUNTER — Ambulatory Visit: Payer: Medicare Other | Admitting: Physical Therapy

## 2015-10-15 ENCOUNTER — Encounter: Payer: Self-pay | Admitting: Physical Therapy

## 2015-10-15 DIAGNOSIS — M6281 Muscle weakness (generalized): Secondary | ICD-10-CM

## 2015-10-15 DIAGNOSIS — R262 Difficulty in walking, not elsewhere classified: Secondary | ICD-10-CM | POA: Diagnosis not present

## 2015-10-15 NOTE — Therapy (Signed)
[place] MAIN [facility] SERVICES [address] [place], [place], [address] Phone: [phone]   Fax:  [phone]  Physical Therapy Treatment  Patient Details  Name: Janet Elliott MRN: [id number] Date of Birth: Dec 03, 1932 Referring Provider: Beverly Gust  Encounter Date: 10/15/2015      PT End of Session - 10/15/15 1823    Visit Number 16   Number of Visits 29   Date for PT Re-Evaluation 2015/11/15   Authorization Type g codes 6/10   PT Start Time Mar 01, 1600   PT Stop Time 1645   PT Time Calculation (min) 43 min   Equipment Utilized During Treatment Gait belt   Activity Tolerance Patient tolerated treatment well;Patient limited by pain   Behavior During Therapy WFL for tasks assessed/performed      Past Medical History:  Diagnosis Date  . Anxiety   . Arthritis    osteoarthritis  . Breast cancer ([place]) 03/01/2000   Left  chemo/radistion  . Carcinoma of left breast ([place])   . Lymphedema   . Sleep apnea     Past Surgical History:  Procedure Laterality Date  . BREAST BIOPSY Right 1980  . EYE SURGERY    . MASTECTOMY Left 03-01-00    There were no vitals filed for this visit.      Subjective Assessment - 10/15/15 1607    Subjective Pt states she will be having cortisone shot in R knee tomorrow. She does not have any therapy this week and states she will be out of town  next week.    Pertinent History Pt reports that this past year she got hearing aids and while she was with the audiologist she reported she was having some dizziness. Audiologist recommended she see the ENT whom she saw 07/02/15. She reports that the ENT told her that her dizziness may be related to her inner ear function. No VNG testing reported however ENT note states that Dix-Hallpike test was negative. She was referred for vestibular therapy from ENT for dizziness. ENT note describes symptoms as pt feeling "off balance." Pt states that her primary concern is her balance currently.  She is fearful of falling and would like to stop her "wobbling." She has a history of some dizziness a few weeks ago and attributes it to quick changes in position however has not recently had any issues. She does endorse a few bouts of vertigo since 10/2012 but not currently. Pt had spinal injections yesterday and denies any back pain currently. Pt has a tremor and follows with neurology. 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 He believes that the etiology is cerebellar tremors but essential tremors cannot be rulted out.     Currently in Pain? Yes   Pain Score 0-No pain   Pain Location Knee   Pain Orientation Right   Pain Descriptors / Indicators Aching      Treatment: NuStep, L2 x 3 min; B UE/LE; unbilled BLE leg press, 110#, 3x10, min VCs for LE positioning and knee/hip alignment and resting in between sets Forward tapping on airex pad, 2x10, min VCs to decrease UE support, 0-2 HHA, CGA-minA from SPT for safety Sit to stands with airex to heighten chair, x6, mild increase in pain even with repositioning of BLE to decrease knee valgus Lateral walking, x4 in // bars, red tband, CGA for safety, 1 HHA, min VCs for BLE positioning forward to address glut med Forward step overs, orange bar (x4), bolster (x2), CGA, min VCs to increase  hip/knee flexion during stepping and to move closer to object before stepping over [place] on firm surface, 1-fingertip HHA, minA for stability, min VCs to increase range and focus on hip/knee flexion. Lateral weight shifts, 2x10, mod VCs and min tactile cues for initiation of exercise, min VCs to contract glut muscle when most of weight was on that leg.  Static stand with leading LE on airex pad, back LE on firm surface, x30 seconds each LE leading, 0 HHA, minA for stability, very difficult                           PT Education - 10/15/15 1823    Education provided Yes   Education Details holding therapy after  cortisone shot, continuing HEP when on vacation   Person(s) Educated Patient   Methods Explanation             PT Long Term Goals - 09/24/15 1320      PT LONG TERM GOAL #1   Title Patient will be independent in home exercise program to improve strength/mobility and balance for better functional independence and decrease fall risk   Time 6   Period Weeks   Status On-going     PT LONG TERM GOAL #2   Title Patient will decrease five times sit to stand test by at least 5 seconds indicating an increased LE strength and improved balance   Baseline 08/02/15: 31.98 seconds   Time 6   Period Weeks   Status On-going     PT LONG TERM GOAL #3   Title Patient will decrease normal TUG test by at least 5 seconds indicating an increased LE strength and improved balance   Baseline 08/02/15: 32.89 seconds   Time 6   Period Weeks   Status Partially Met     PT LONG TERM GOAL #4   Title Patient will increase 10 meter walk test by at least 0.13 m/s in order to improve gait speed for better community ambulation and to reduce fall risk   Baseline 08/02/15: 0.54 m/s   Time 6   Period Weeks   Status On-going               Plan - 10/15/15 1824    Clinical Impression Statement Pt continues to be limited by B knee pain. She requires min-mod VCs for correct performance of activities and CGA for safety during these activities. She states she will be receiving a cortisone shot in one knee tomorrow and hopes that will help. She was instructed to continue with HEP as she is leaving on vacation for a week. She continues to demonstrate poor glut max/med control. The patient would continue to benefit from skilled PT to address BLE weakness, safety, and improve balance.    Rehab Potential Fair   Clinical Impairments Affecting Rehab Potential Positive: motivation; Negative: chronicity, age, and central signs   PT Frequency 2x / week   PT Duration 6 weeks   PT Treatment/Interventions ADLs/Self Care Home  Management;Aquatic Therapy;Canalith Repostioning;Gait training;Stair training;Functional mobility training;Therapeutic activities;Therapeutic exercise;Balance training;Neuromuscular re-education;Patient/family education;Manual techniques;Vestibular   PT Next Visit Plan Continued reinforcement and review of HEP; introduce LE strengthening exercises, single leg stance exercises   PT Home Exercise Plan HEP maintained, see patient instructions   Consulted and Agree with Plan of Care Patient      Patient will benefit from skilled therapeutic intervention in order to improve the following deficits and impairments:  Abnormal gait, Decreased  balance, Dizziness, Decreased strength, Decreased mobility  Visit Diagnosis: Difficulty in walking, not elsewhere classified  Muscle weakness (generalized)     Problem List Patient Active Problem List   Diagnosis Date Noted  . Carcinoma of left breast ([place]) 05/26/2014  . Adult BMI 30+ 03/20/2014  . Morbid obesity ([place]) 03/20/2014  . Arthritis of knee, degenerative 10/24/2013  . Focal lymphocytic colitis 10/11/2013  . Lymphocytic colitis 10/11/2013  . Impingement syndrome of shoulder 09/05/2013  . Impingement syndrome of left shoulder 09/05/2013  . Combined fat and carbohydrate induced hyperlipemia 07/08/2013  . Benign hypertension 07/08/2013  . Diabetes mellitus, type 2 ([place]) 07/08/2013  . Type 2 diabetes mellitus ([place]) 07/08/2013  . Addison anemia 07/08/2013  . Bulge of lumbar disc without myelopathy 05/22/2013   Tilman Neat, SPT This entire session was performed under direct supervision and direction of a licensed therapist/therapist assistant . I have personally read, edited and approve of the note as written.  Trotter,Margaret PT, DPT 10/16/2015, 9:21 AM  [place] MAIN [facility] SERVICES [address] [place], [place], [address] Phone: [phone]   Fax:  [phone]  Name: Janet Elliott MRN:  [id number] Date of Birth: 1932-07-27

## 2015-10-29 ENCOUNTER — Ambulatory Visit: Payer: Medicare Other | Admitting: Physical Therapy

## 2015-10-29 ENCOUNTER — Encounter: Payer: Self-pay | Admitting: Physical Therapy

## 2015-10-29 DIAGNOSIS — R42 Dizziness and giddiness: Secondary | ICD-10-CM

## 2015-10-29 DIAGNOSIS — M6281 Muscle weakness (generalized): Secondary | ICD-10-CM

## 2015-10-29 DIAGNOSIS — R262 Difficulty in walking, not elsewhere classified: Secondary | ICD-10-CM

## 2015-10-29 NOTE — Therapy (Signed)
[place] MAIN [facility] SERVICES [address] [place], [place], [address] Phone: [phone]   Fax:  [phone]  Physical Therapy Treatment  Patient Details  Name: Janet Elliott MRN: [id number] Date of Birth: Nov 21, 1932 Referring Provider: Beverly Gust  Encounter Date: 10/29/2015      PT End of Session - 10/29/15 1152    Visit Number 17   Number of Visits 29   Date for PT Re-Evaluation November 07, 2015   Authorization Type g codes 7/10   PT Start Time 03-11-02   PT Stop Time 1148   PT Time Calculation (min) 44 min   Equipment Utilized During Treatment Gait belt   Activity Tolerance Patient tolerated treatment well;Patient limited by pain   Behavior During Therapy WFL for tasks assessed/performed      Past Medical History:  Diagnosis Date  . Anxiety   . Arthritis    osteoarthritis  . Breast cancer ([place]) 03/10/2000   Left  chemo/radistion  . Carcinoma of left breast ([place])   . Lymphedema   . Sleep apnea     Past Surgical History:  Procedure Laterality Date  . BREAST BIOPSY Right 1980  . EYE SURGERY    . MASTECTOMY Left 03-10-00    There were no vitals filed for this visit.      Subjective Assessment - 10/29/15 1108    Subjective Patient reports she had a cortisone shot in R and L knee 2 weeks ago. She has been on vacation since and was able to do some of her exercises but not all of them because she forgot her HEP sheet at home. Pt will have shots on Oct. 27th for her LBP.    Pertinent History Pt reports that this past year she got hearing aids and while she was with the audiologist she reported she was having some dizziness. Audiologist recommended she see the ENT whom she saw 07/02/15. She reports that the ENT told her that her dizziness may be related to her inner ear function. No VNG testing reported however ENT note states that Dix-Hallpike test was negative. She was referred for vestibular therapy from ENT for dizziness. ENT note describes  symptoms as pt feeling "off balance." Pt states that her primary concern is her balance currently. She is fearful of falling and would like to stop her "wobbling." She has a history of some dizziness a few weeks ago and attributes it to quick changes in position however has not recently had any issues. She does endorse a few bouts of vertigo since 10/2012 but not currently. Pt had spinal injections yesterday and denies any back pain currently. Pt has a tremor and follows with neurology. 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 He believes that the etiology is cerebellar tremors but essential tremors cannot be rulted out.     Currently in Pain? No/denies     Treatment: Sit to stand, 2x10, with 2# weight bar push out, min VCs to allow greater knee flexion and keep nose over toes for forward trunk lean Forward tapping on 4" step, 2x10, minA for stability, min VCs to decrease UE support, 0-2 HHA 4 way hip with yellow tband, except hip abduction, 2x10 each, min VCs to decrease upper body movement, CGA for safety, 1 HHA Counterclockwise and clockwise stepping in 4 square, no HHA, min VCs for LE placement, minA for stability BLE, 90#, 2x10, min VCs not to perform knee hyperextension Static stance on half bolster (flat side up), 3x30 seconds, minA for  stability, 0-2 HHA, min VCs for feet placement and to decrease UE support                              PT Education - 10/29/15 1152    Education provided Yes   Education Details continuing with HEP, moving BLE when sitting if slighlty [employer]) Educated Patient   Methods Explanation   Comprehension Verbalized understanding             PT Long Term Goals - 09/24/15 1320      PT LONG TERM GOAL #1   Title Patient will be independent in home exercise program to improve strength/mobility and balance for better functional independence and decrease fall risk   Time 6   Period Weeks   Status  On-going     PT LONG TERM GOAL #2   Title Patient will decrease five times sit to stand test by at least 5 seconds indicating an increased LE strength and improved balance   Baseline 08/02/15: 31.98 seconds   Time 6   Period Weeks   Status On-going     PT LONG TERM GOAL #3   Title Patient will decrease normal TUG test by at least 5 seconds indicating an increased LE strength and improved balance   Baseline 08/02/15: 32.89 seconds   Time 6   Period Weeks   Status Partially Met     PT LONG TERM GOAL #4   Title Patient will increase 10 meter walk test by at least 0.13 m/s in order to improve gait speed for better community ambulation and to reduce fall risk   Baseline 08/02/15: 0.54 m/s   Time 6   Period Weeks   Status On-going               Plan - 10/29/15 1152    Clinical Impression Statement Pt has decrease B knee pain today due to B knee cortisone shots ~2 weeks ago. She requires min-mod VCs for correct performance of activities and CGA-minA during activities. She perform activities slowly even with encouragement to continue moving. The patient would continue to benefit from skilled PT in order to address BLE weakness, safety, and improve her balance.    Rehab Potential Fair   Clinical Impairments Affecting Rehab Potential Positive: motivation; Negative: chronicity, age, and central signs   PT Frequency 2x / week   PT Duration 6 weeks   PT Treatment/Interventions ADLs/Self Care Home Management;Aquatic Therapy;Canalith Repostioning;Gait training;Stair training;Functional mobility training;Therapeutic activities;Therapeutic exercise;Balance training;Neuromuscular re-education;Patient/family education;Manual techniques;Vestibular   PT Next Visit Plan Continued reinforcement and review of HEP; introduce LE strengthening exercises, single leg stance exercises   PT Home Exercise Plan HEP maintained, see patient instructions   Consulted and Agree with Plan of Care Patient       Patient will benefit from skilled therapeutic intervention in order to improve the following deficits and impairments:  Abnormal gait, Decreased balance, Dizziness, Decreased strength, Decreased mobility  Visit Diagnosis: Difficulty in walking, not elsewhere classified  Muscle weakness (generalized)  Dizziness and giddiness     Problem List Patient Active Problem List   Diagnosis Date Noted  . Carcinoma of left breast ([place]) 05/26/2014  . Adult BMI 30+ 03/20/2014  . Morbid obesity ([place]) 03/20/2014  . Arthritis of knee, degenerative 10/24/2013  . Focal lymphocytic colitis 10/11/2013  . Lymphocytic colitis 10/11/2013  . Impingement syndrome of shoulder 09/05/2013  . Impingement syndrome of left shoulder  09/05/2013  . Combined fat and carbohydrate induced hyperlipemia 07/08/2013  . Benign hypertension 07/08/2013  . Diabetes mellitus, type 2 ([place]) 07/08/2013  . Type 2 diabetes mellitus ([place]) 07/08/2013  . Addison anemia 07/08/2013  . Bulge of lumbar disc without myelopathy 05/22/2013   Tilman Neat, SPT This entire session was performed under direct supervision and direction of a licensed therapist/therapist assistant . I have personally read, edited and approve of the note as written.  Trotter,Margaret  PT, DPT 10/29/2015, 2:12 PM  [place] MAIN [facility] SERVICES [address] [place], [place], [address] Phone: [phone]   Fax:  [phone]  Name: Janet Elliott MRN: [id number] Date of Birth: 1932/04/24

## 2015-10-31 ENCOUNTER — Ambulatory Visit: Payer: Medicare Other | Admitting: Physical Therapy

## 2015-10-31 ENCOUNTER — Encounter: Payer: Self-pay | Admitting: Physical Therapy

## 2015-10-31 DIAGNOSIS — R262 Difficulty in walking, not elsewhere classified: Secondary | ICD-10-CM | POA: Diagnosis not present

## 2015-10-31 DIAGNOSIS — R42 Dizziness and giddiness: Secondary | ICD-10-CM

## 2015-10-31 DIAGNOSIS — M6281 Muscle weakness (generalized): Secondary | ICD-10-CM

## 2015-10-31 NOTE — Therapy (Signed)
[place] MAIN [facility] SERVICES [address] [place], [place], [address] Phone: [phone]   Fax:  [phone]  Physical Therapy Treatment  Patient Details  Name: Janet Elliott MRN: [id number] Date of Birth: [birth year] Referring Provider: Beverly Gust  Encounter Date: 10/31/2015      PT End of Session - 10/31/15 1151    Visit Number 18   Number of Visits 29   Date for PT Re-Evaluation 2015/11/11   Authorization Type g codes 8/10   PT Start Time 1058   PT Stop Time 1143   PT Time Calculation (min) 45 min   Equipment Utilized During Treatment Gait belt   Activity Tolerance Patient tolerated treatment well;Patient limited by pain   Behavior During Therapy WFL for tasks assessed/performed      Past Medical History:  Diagnosis Date  . Anxiety   . Arthritis    osteoarthritis  . Breast cancer ([place]) 2002   Left  chemo/radistion  . Carcinoma of left breast ([place])   . Lymphedema   . Sleep apnea     Past Surgical History:  Procedure Laterality Date  . BREAST BIOPSY Right 1980  . EYE SURGERY    . MASTECTOMY Left 2002    There were no vitals filed for this visit.      Subjective Assessment - 10/31/15 1104    Subjective Patient reports she had a fall early Wednesday morning because she was rushing to the bathroom and caught her feet on each other. She was not using her AD, but stated that she wouldn't have been able to move it into the bathroom easily so she doesn't use it often when rushing to the bathroom. She denies any injuries and states she has no pain from the fall.   Pertinent History Pt reports that this past year she got hearing aids and while she was with the audiologist she reported she was having some dizziness. Audiologist recommended she see the ENT whom she saw 07/02/15. She reports that the ENT told her that her dizziness may be related to her inner ear function. No VNG testing reported however ENT note states that  Dix-Hallpike test was negative. She was referred for vestibular therapy from ENT for dizziness. ENT note describes symptoms as pt feeling "off balance." Pt states that her primary concern is her balance currently. She is fearful of falling and would like to stop her "wobbling." She has a history of some dizziness a few weeks ago and attributes it to quick changes in position however has not recently had any issues. She does endorse a few bouts of vertigo since 10/2012 but not currently. Pt had spinal injections yesterday and denies any back pain currently. Pt has a tremor and follows with neurology. 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 He believes that the etiology is cerebellar tremors but essential tremors cannot be rulted out.     Currently in Pain? Yes   Pain Score 2    Pain Location Back   Pain Orientation Lower   Pain Descriptors / Indicators Aching     Treatment:  NuStep, L2 x3 min; unbilled Forward and backwards walking, x3 each direction, no HHA or RW, CGA-minA for stability, min VCs to increase step length and improve ankle DF through stepping Sit to stand, 2x6, with 2.5# bar push/pulls, min VCs to control eccentric movement, mild increase in knee pain so no more reps Tapping on airex pad, patient notes increase knee pain so d/c, x5 reps,  no HHA, CGA-minA for safety Forward foot on 4" step, instructed to maintain stance with post LE, able to perform forward rocking with RUE on step x5, unable to maintain appropriate hip alignment on RLE with LLE on step  with max VCs and  tactile cues to fire glut max/med, unable to perform so d/c Tandem stance with UE reaches in multi-planar directions, x4 with each LE leading, min VCs to contract glut muscle for stability Hip extension against yellow tband, 2x10, 2 HHA, min VCs to contract glut max muscle when moving into hip extension Seated hip ABD, 2x10 with red tband resistance, min VCs to maintain feet in position and  just move hips and move away from the back of the chair Lateral walking over yellow tband, 0-1 HHA, CGA-minA for safety, 1 LOB with minA to correct, x2 passes, min VCs for appropriate performance                             PT Education - 10/31/15 1150    Education provided Yes   Education Details safety with ambulation, hip strengthening exercises   Person(s) Educated Patient   Methods Explanation   Comprehension Verbalized understanding             PT Long Term Goals - 09/24/15 1320      PT LONG TERM GOAL #1   Title Patient will be independent in home exercise program to improve strength/mobility and balance for better functional independence and decrease fall risk   Time 6   Period Weeks   Status On-going     PT LONG TERM GOAL #2   Title Patient will decrease five times sit to stand test by at least 5 seconds indicating an increased LE strength and improved balance   Baseline 08/02/15: 31.98 seconds   Time 6   Period Weeks   Status On-going     PT LONG TERM GOAL #3   Title Patient will decrease normal TUG test by at least 5 seconds indicating an increased LE strength and improved balance   Baseline 08/02/15: 32.89 seconds   Time 6   Period Weeks   Status Partially Met     PT LONG TERM GOAL #4   Title Patient will increase 10 meter walk test by at least 0.13 m/s in order to improve gait speed for better community ambulation and to reduce fall risk   Baseline 08/02/15: 0.54 m/s   Time 6   Period Weeks   Status On-going               Plan - 10/31/15 1151    Clinical Impression Statement Pt continues to demonstrate poor body awareness during activities requiring increased cueing. She at times appears to be distracted or not present witht he PT session. Pt notes during activities that her B knee pain begain creating issues. She was able to tolerate sit to stand and lateral stepping exercises without an increase in pain. She requires CGA-minA  during activities for safety and mod VCs for appropriate exercise technique. She has increased difficulty with controlling R hip during L foot in on 4" step, d/c due to poor form. The patient would continue to benefit from PT to assess improvements, improve functional mobility, and decrease risk for falls.   Rehab Potential Fair   Clinical Impairments Affecting Rehab Potential Positive: motivation; Negative: chronicity, age, and central signs   PT Frequency 2x / week   PT Duration  6 weeks   PT Treatment/Interventions ADLs/Self Care Home Management;Aquatic Therapy;Canalith Repostioning;Gait training;Stair training;Functional mobility training;Therapeutic activities;Therapeutic exercise;Balance training;Neuromuscular re-education;Patient/family education;Manual techniques;Vestibular   PT Next Visit Plan Continued reinforcement and review of HEP; introduce LE strengthening exercises, single leg stance exercises   PT Home Exercise Plan HEP maintained, see patient instructions   Consulted and Agree with Plan of Care Patient      Patient will benefit from skilled therapeutic intervention in order to improve the following deficits and impairments:  Abnormal gait, Decreased balance, Dizziness, Decreased strength, Decreased mobility  Visit Diagnosis: Difficulty in walking, not elsewhere classified  Muscle weakness (generalized)  Dizziness and giddiness     Problem List Patient Active Problem List   Diagnosis Date Noted  . Carcinoma of left breast ([place]) 05/26/2014  . Adult BMI 30+ 03/20/2014  . Morbid obesity ([place]) 03/20/2014  . Arthritis of knee, degenerative 10/24/2013  . Focal lymphocytic colitis 10/11/2013  . Lymphocytic colitis 10/11/2013  . Impingement syndrome of shoulder 09/05/2013  . Impingement syndrome of left shoulder 09/05/2013  . Combined fat and carbohydrate induced hyperlipemia 07/08/2013  . Benign hypertension 07/08/2013  . Diabetes mellitus, type 2 ([place]) 07/08/2013  .  Type 2 diabetes mellitus ([place]) 07/08/2013  . Addison anemia 07/08/2013  . Bulge of lumbar disc without myelopathy 05/22/2013   Tilman Neat, SPT This entire session was performed under direct supervision and direction of a licensed therapist/therapist assistant . I have personally read, edited and approve of the note as written.  Trotter,Margaret PT, DPT 10/31/2015, 2:32 PM  [place] MAIN [facility] SERVICES [address] [place], [place], [address] Phone: [phone]   Fax:  [phone]  Name: ERILYN PEARMAN MRN: [id number] Date of Birth: [birth year]

## 2015-11-05 ENCOUNTER — Encounter: Payer: Self-pay | Admitting: Physical Therapy

## 2015-11-05 ENCOUNTER — Ambulatory Visit: Payer: Medicare Other | Admitting: Physical Therapy

## 2015-11-05 DIAGNOSIS — M6281 Muscle weakness (generalized): Secondary | ICD-10-CM

## 2015-11-05 DIAGNOSIS — R262 Difficulty in walking, not elsewhere classified: Secondary | ICD-10-CM | POA: Diagnosis not present

## 2015-11-05 DIAGNOSIS — R42 Dizziness and giddiness: Secondary | ICD-10-CM

## 2015-11-05 NOTE — Therapy (Signed)
[place] MAIN [facility] SERVICES [address] [place], [place], [address] Phone: [phone]   Fax:  [phone]  Physical Therapy Treatment/Progress Note  Patient Details  Name: Janet Elliott MRN: [id number] Date of Birth: 07/24/1932 Referring Provider: Beverly Gust  Encounter Date: 11/05/2015      PT End of Session - 11/05/15 1101    Visit Number 19   Number of Visits 37   Date for PT Re-Evaluation 12-26-2015   Authorization Type g codes 10/08/2022   PT Start Time 8563   PT Stop Time 1100   PT Time Calculation (min) 46 min   Equipment Utilized During Treatment Gait belt   Activity Tolerance Patient tolerated treatment well   Behavior During Therapy WFL for tasks assessed/performed      Past Medical History:  Diagnosis Date  . Anxiety   . Arthritis    osteoarthritis  . Breast cancer ([place]) 2002   Left  chemo/radistion  . Carcinoma of left breast ([place])   . Lymphedema   . Sleep apnea     Past Surgical History:  Procedure Laterality Date  . BREAST BIOPSY Right 1980  . EYE SURGERY    . MASTECTOMY Left 2002    There were no vitals filed for this visit.      Subjective Assessment - 11/05/15 1013    Subjective Pt she had another fall the exact same way as her last fall. She continues to states that she is half asleep when she is rushing and seems to catch her feet on the carpet. She denies any injury. She states her blood sugar is in a normal range ~130.    Pertinent History Pt reports that this past year she got hearing aids and while she was with the audiologist she reported she was having some dizziness. Audiologist recommended she see the ENT whom she saw 07/02/15. She reports that the ENT told her that her dizziness may be related to her inner ear function. No VNG testing reported however ENT note states that Dix-Hallpike test was negative. She was referred for vestibular therapy from ENT for dizziness. ENT note describes symptoms as  pt feeling "off balance." Pt states that her primary concern is her balance currently. She is fearful of falling and would like to stop her "wobbling." She has a history of some dizziness a few weeks ago and attributes it to quick changes in position however has not recently had any issues. She does endorse a few bouts of vertigo since 10/2012 but not currently. Pt had spinal injections yesterday and denies any back pain currently. Pt has a tremor and follows with neurology. 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 He believes that the etiology is cerebellar tremors but essential tremors cannot be rulted out.     Currently in Pain? No/denies            [facility] PT Assessment - 11/05/15 0001      Standardized Balance Assessment   Five times sit to stand comments  22.4s, no UE use, >15 seconds indicates increased risk for falls   10 Meter Walk 18.4s=0.68ms, CGA, RW, <1.05m indicates increased risk for falls     Berg Balance Test   Sit to Stand Able to stand without using hands and stabilize independently   Standing Unsupported Able to stand safely 2 minutes   Sitting with Back Unsupported but Feet Supported on Floor or Stool Able to sit safely and securely 2 minutes   Stand to  Sit Sits safely with minimal use of hands   Transfers Able to transfer safely, definite need of hands   Standing Unsupported with Eyes Closed Able to stand 10 seconds safely   Standing Ubsupported with Feet Together Able to place feet together independently and stand 1 minute safely   From Standing, Reach Forward with Outstretched Arm Can reach forward >12 cm safely (5")   From Standing Position, Pick up Object from Floor Able to pick up shoe, needs supervision   From Standing Position, Turn to Look Behind Over each Shoulder Looks behind one side only/other side shows less weight shift   Turn 360 Degrees Able to turn 360 degrees safely but slowly   Standing Unsupported, Alternately Place Feet on  Step/Stool Able to complete >2 steps/needs minimal assist   Standing Unsupported, One Foot in Front Able to plae foot ahead of the other independently and hold 30 seconds   Standing on One Leg Tries to lift leg/unable to hold 3 seconds but remains standing independently   Total Score 43   Berg comment: <45 indicates increased risk  for falls in 12 months     Timed Up and Go Test   TUG Normal TUG   Normal TUG (seconds) 26.1  >14 sec=high falls risk     Treatment:  Patient's goals and outcome measures were re-assessed. She requires CGA and min VCs for how to perform outcome measures such as Berg, 5x STS, TUG, and 10 meter walk test. She has 1 LOB and is able to regain with HHA and minAShe is progression towards her goals, but would continue to benefit from therapy.  Warm up on NuStep L1x3 minutes; unbilled Educated patient on falls safety with using RW at home, continuing to walk closely to RW, and moving clutter or objects out of the way in highly trafficked area. Instructed to perform walking in a safe area 3xday for 3 min.  BLE leg press, 110#, 2x10, min VCs to control eccentric movement.                         PT Education - 11/05/15 1101    Education provided Yes   Education Details falls safety, changing home environment if needed, using RW, POC   Person(s) Educated Patient   Methods Explanation   Comprehension Verbalized understanding             PT Long Term Goals - 11/05/15 1104      PT LONG TERM GOAL #1   Title Patient will be independent in home exercise program to improve strength/mobility and balance for better functional independence and decrease fall risk   Time 4   Period Weeks   Status Partially Met     PT LONG TERM GOAL #2   Title Patient will decrease five times sit to stand test by at least 5 seconds indicating an increased LE strength and improved balance   Baseline 08/02/15: 31.98 seconds   Time 6   Period Weeks   Status Achieved      PT LONG TERM GOAL #3   Title Patient will decrease normal TUG test by at least 5 seconds indicating an increased LE strength and improved balance   Baseline 08/02/15: 32.89 seconds   Time 6   Period Weeks   Status Achieved     PT LONG TERM GOAL #4   Title Patient will increase 10 meter walk test by at least 0.13 m/s in order to improve gait  speed for better community ambulation and to reduce fall risk   Baseline 08/02/15: 0.54 m/s   Time 4   Period Weeks   Status Partially Met     PT LONG TERM GOAL #5   Title Patient will improve Berg balance score to 44/56 in order to decrease risk for falls.    Time 4   Period Weeks               Plan - 11/05/15 1102    Clinical Impression Statement Pt's goals and outcome measures were re-assessed today. She demonstrated improvements in her 5x sit to stand and TUG test. She has improved her 10 meter walk test slightly but has not met her goal yet. Her Berg score has not changed quantitatively but her performance quality has seemed to improve with increased steadiness. She continues to have fear of fall and has had 2 falls recently early in the am. The patient would continue to benefit from therapy in order to improve safety with mobility and balance to decrease risk for falls.    Rehab Potential Fair   Clinical Impairments Affecting Rehab Potential Positive: motivation; Negative: chronicity, age, and central signs   PT Frequency 2x / week   PT Duration 4 weeks   PT Treatment/Interventions ADLs/Self Care Home Management;Aquatic Therapy;Canalith Repostioning;Gait training;Stair training;Functional mobility training;Therapeutic activities;Therapeutic exercise;Balance training;Neuromuscular re-education;Patient/family education;Manual techniques;Vestibular   PT Next Visit Plan Continued reinforcement and review of HEP; introduce LE strengthening exercises, single leg stance exercises   PT Home Exercise Plan HEP maintained, see patient  instructions   Consulted and Agree with Plan of Care Patient      Patient will benefit from skilled therapeutic intervention in order to improve the following deficits and impairments:  Abnormal gait, Decreased balance, Dizziness, Decreased strength, Decreased mobility  Visit Diagnosis: Difficulty in walking, not elsewhere classified - Plan: PT plan of care cert/re-cert  Muscle weakness (generalized) - Plan: PT plan of care cert/re-cert  Dizziness and giddiness - Plan: PT plan of care cert/re-cert     Problem List Patient Active Problem List   Diagnosis Date Noted  . Carcinoma of left breast ([place]) 05/26/2014  . Adult BMI 30+ 03/20/2014  . Morbid obesity ([place]) 03/20/2014  . Arthritis of knee, degenerative 10/24/2013  . Focal lymphocytic colitis 10/11/2013  . Lymphocytic colitis 10/11/2013  . Impingement syndrome of shoulder 09/05/2013  . Impingement syndrome of left shoulder 09/05/2013  . Combined fat and carbohydrate induced hyperlipemia 07/08/2013  . Benign hypertension 07/08/2013  . Diabetes mellitus, type 2 ([place]) 07/08/2013  . Type 2 diabetes mellitus ([place]) 07/08/2013  . Addison anemia 07/08/2013  . Bulge of lumbar disc without myelopathy 05/22/2013   Tilman Neat, SPT This entire session was performed under direct supervision and direction of a licensed therapist/therapist assistant . I have personally read, edited and approve of the note as written.  Trotter,Margaret PT, DPT 11/05/2015, 11:37 AM  [place] MAIN [facility] SERVICES [address] [place], [place], [address] Phone: [phone]   Fax:  [phone]  Name: BRISTYN KULESZA MRN: [id number] Date of Birth: 1932-11-15

## 2015-11-07 ENCOUNTER — Ambulatory Visit: Payer: Medicare Other | Attending: Unknown Physician Specialty | Admitting: Physical Therapy

## 2015-11-07 ENCOUNTER — Encounter: Payer: Self-pay | Admitting: Physical Therapy

## 2015-11-07 DIAGNOSIS — R42 Dizziness and giddiness: Secondary | ICD-10-CM | POA: Insufficient documentation

## 2015-11-07 DIAGNOSIS — M6281 Muscle weakness (generalized): Secondary | ICD-10-CM | POA: Insufficient documentation

## 2015-11-07 DIAGNOSIS — R262 Difficulty in walking, not elsewhere classified: Secondary | ICD-10-CM | POA: Diagnosis not present

## 2015-11-07 NOTE — Therapy (Signed)
[place] MAIN [facility] SERVICES [address] [place], [place], [address] Phone: [phone]   Fax:  [phone]  Physical Therapy Treatment  Patient Details  Name: Janet Elliott MRN: [id number] Date of Birth: 09-28-1932 Referring Provider: Beverly Gust  Encounter Date: 11/07/2015      PT End of Session - 11/07/15 1139    Visit Number 20   Number of Visits 37   Date for PT Re-Evaluation 12-17-2015   Authorization Type g codes 10/10   PT Start Time 1018   PT Stop Time 1058   PT Time Calculation (min) 40 min   Equipment Utilized During Treatment Gait belt   Activity Tolerance Patient tolerated treatment well   Behavior During Therapy WFL for tasks assessed/performed      Past Medical History:  Diagnosis Date  . Anxiety   . Arthritis    osteoarthritis  . Breast cancer ([place]) 2002   Left  chemo/radistion  . Carcinoma of left breast ([place])   . Lymphedema   . Sleep apnea     Past Surgical History:  Procedure Laterality Date  . BREAST BIOPSY Right 1980  . EYE SURGERY    . MASTECTOMY Left 2002    There were no vitals filed for this visit.      Subjective Assessment - 11/07/15 1137    Subjective Pt denies falls and states she has been trying to use her RW more at home. She states she "twisted" her knee funny when stepping out of the car.    Pertinent History Pt reports that this past year she got hearing aids and while she was with the audiologist she reported she was having some dizziness. Audiologist recommended she see the ENT whom she saw 07/02/15. She reports that the ENT told her that her dizziness may be related to her inner ear function. No VNG testing reported however ENT note states that Dix-Hallpike test was negative. She was referred for vestibular therapy from ENT for dizziness. ENT note describes symptoms as pt feeling "off balance." Pt states that her primary concern is her balance currently. She is fearful of falling and  would like to stop her "wobbling." She has a history of some dizziness a few weeks ago and attributes it to quick changes in position however has not recently had any issues. She does endorse a few bouts of vertigo since 10/2012 but not currently. Pt had spinal injections yesterday and denies any back pain currently. Pt has a tremor and follows with neurology. 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 He believes that the etiology is cerebellar tremors but essential tremors cannot be rulted out.     Currently in Pain? Yes   Pain Score 2    Pain Location Knee   Pain Orientation Right   Pain Descriptors / Indicators Squeezing     Treatment: Heel/toe raises, 2x10,  0-2 HHA, CGA-minA for stability, encouraged to decrease UE support and improve toe raises to improve ankle DF during ambulation Marches against red tband, 2x10, no HHA, min VCs to increase hip/knee flexion and stability Instructed to ambulate in // bars no UE support, x4, minA for stability, min VCs to increase heel strike Instructed to walk on heels to promote heel to toe walking, x4 in // bars, minA for stability, min VCs to regain balance if losing balance and then begin again Forward heel taps on dot, 2x10, no HHA, minA for safety, min VCs to  Lateral step taps onto airex, x10  each UE, CGA-minA for stability, 0-1 HHA, min VCs to shift weight onto foot on airex pad to activate glut med muscle Forward stepping over 3 resistance bands on ground, x6 in // bars, 0-1 HHA, minA for safety, min VCs to get closer to object before stepping, although patient states she understands she does not perform the activity well.  Lateral walking, yellow tband resistance, x4 in // bars, CGA, min VCs to increase step length and maintain feet in forward position Static standing, feet together, x10 vertical head movements, CGA for safety, no HHA                           PT Education - 11/07/15 1139    Education  provided Yes   Education Details LE strengthening at home, goal for therapy   Person(s) Educated Patient   Methods Explanation   Comprehension Verbalized understanding             PT Long Term Goals - 11/07/15 1143      PT LONG TERM GOAL #1   Title Patient will be independent in home exercise program to improve strength/mobility and balance for better functional independence and decrease fall risk   Time 4   Period Weeks   Status Partially Met     PT LONG TERM GOAL #2   Title Patient will decrease five times sit to stand test by at least 5 seconds indicating an increased LE strength and improved balance   Baseline 08/02/15: 31.98 seconds   Time 6   Period Weeks   Status Achieved     PT LONG TERM GOAL #3   Title Patient will decrease normal TUG test by at least 5 seconds indicating an increased LE strength and improved balance   Baseline 08/02/15: 32.89 seconds   Time 6   Period Weeks   Status Achieved     PT LONG TERM GOAL #4   Title Patient will increase 10 meter walk test by at least 0.13 m/s in order to improve gait speed for better community ambulation and to reduce fall risk   Baseline 08/02/15: 0.54 m/s   Time 4   Period Weeks   Status Partially Met     PT LONG TERM GOAL #5   Title Patient will improve Berg balance score to 44/56 in order to decrease risk for falls.    Time 4   Period Weeks               Plan - 11/07/15 1140    Clinical Impression Statement Although patient reports mild knee pain on RLE, she was able to participate in this therapy session with mild increase in pain and 1 seated rest break. She requires minA during activities with no UE support to regain balance especially when performing dynamic activites. She requires min-mod VCs to improve exercise performance. She has weak hip musculature especially on RLE, but she has difficulty maintaining appropriate positions during exercises. She is more actively involved in therapy today. The  patient would continue to benefit from skilled PT in order to address safety with moiblity, balance, and decrease her risk for falls.    Rehab Potential Fair   Clinical Impairments Affecting Rehab Potential Positive: motivation; Negative: chronicity, age, and central signs   PT Frequency 2x / week   PT Duration 4 weeks   PT Treatment/Interventions ADLs/Self Care Home Management;Aquatic Therapy;Canalith Repostioning;Gait training;Stair training;Functional mobility training;Therapeutic activities;Therapeutic exercise;Balance training;Neuromuscular re-education;Patient/family education;Manual techniques;Vestibular  PT Next Visit Plan Continued reinforcement and review of HEP; introduce LE strengthening exercises, single leg stance exercises   PT Home Exercise Plan HEP maintained, see patient instructions   Consulted and Agree with Plan of Care Patient      Patient will benefit from skilled therapeutic intervention in order to improve the following deficits and impairments:  Abnormal gait, Decreased balance, Dizziness, Decreased strength, Decreased mobility  Visit Diagnosis: Difficulty in walking, not elsewhere classified  Muscle weakness (generalized)  Dizziness and giddiness       G-Codes - 2015-12-03 1319    Functional Assessment Tool Used clinical judgement,  5TSTS, TUG   Functional Limitation Mobility: Walking and moving around   Mobility: Walking and Moving Around Current Status [phone]) At least 60 percent but less than 80 percent impaired, limited or restricted   Mobility: Walking and Moving Around Goal Status [phone]) At least 40 percent but less than 60 percent impaired, limited or restricted      Problem List Patient Active Problem List   Diagnosis Date Noted  . Carcinoma of left breast ([place]) 05/26/2014  . Adult BMI 30+ 03/20/2014  . Morbid obesity ([place]) 03/20/2014  . Arthritis of knee, degenerative 10/24/2013  . Focal lymphocytic colitis 10/11/2013  . Lymphocytic colitis  10/11/2013  . Impingement syndrome of shoulder 09/05/2013  . Impingement syndrome of left shoulder 09/05/2013  . Combined fat and carbohydrate induced hyperlipemia 07/08/2013  . Benign hypertension 07/08/2013  . Diabetes mellitus, type 2 ([place]) 07/08/2013  . Type 2 diabetes mellitus ([place]) 07/08/2013  . Addison anemia 07/08/2013  . Bulge of lumbar disc without myelopathy 05/22/2013   Tilman Neat, SPT This entire session was performed under direct supervision and direction of a licensed therapist/therapist assistant . I have personally read, edited and approve of the note as written.  Trotter,Margaret  PT, DPT 12-03-15, 1:19 PM  [place] MAIN [facility] SERVICES [address] [place], [place], [address] Phone: [phone]   Fax:  [phone]  Name: Janet Elliott MRN: [id number] Date of Birth: 1932/03/09

## 2015-11-12 ENCOUNTER — Encounter: Payer: Self-pay | Admitting: Physical Therapy

## 2015-11-12 ENCOUNTER — Ambulatory Visit: Payer: Medicare Other | Admitting: Physical Therapy

## 2015-11-12 DIAGNOSIS — R262 Difficulty in walking, not elsewhere classified: Secondary | ICD-10-CM

## 2015-11-12 DIAGNOSIS — R42 Dizziness and giddiness: Secondary | ICD-10-CM

## 2015-11-12 DIAGNOSIS — M6281 Muscle weakness (generalized): Secondary | ICD-10-CM

## 2015-11-12 NOTE — Therapy (Signed)
[place] MAIN [facility] SERVICES [address] [place], [place], [address] Phone: [phone]   Fax:  [phone]  Physical Therapy Treatment  Patient Details  Name: Janet Elliott MRN: [id number] Date of Birth: [birth year] Referring Provider: Beverly Gust  Encounter Date: 11/12/2015      PT End of Session - 11/12/15 1126    Visit Number 21   Number of Visits 37   Date for PT Re-Evaluation Dec 20, 2015   Authorization Type g codes 1/10   PT Start Time 1028   PT Stop Time 1124   PT Time Calculation (min) 56 min   Equipment Utilized During Treatment Gait belt   Activity Tolerance Patient tolerated treatment well   Behavior During Therapy WFL for tasks assessed/performed      Past Medical History:  Diagnosis Date  . Anxiety   . Arthritis    osteoarthritis  . Breast cancer ([place]) 2002   Left  chemo/radistion  . Carcinoma of left breast ([place])   . Lymphedema   . Sleep apnea     Past Surgical History:  Procedure Laterality Date  . BREAST BIOPSY Right 1980  . EYE SURGERY    . MASTECTOMY Left 2002    There were no vitals filed for this visit.      Subjective Assessment - 11/12/15 1026    Subjective Pt denies falls. States her day started off not as good because she woke up with back pain. She states that she will be getting her cortisone shot in her back this Friday.    Pertinent History Pt reports that this past year she got hearing aids and while she was with the audiologist she reported she was having some dizziness. Audiologist recommended she see the ENT whom she saw 07/02/15. She reports that the ENT told her that her dizziness may be related to her inner ear function. No VNG testing reported however ENT note states that Dix-Hallpike test was negative. She was referred for vestibular therapy from ENT for dizziness. ENT note describes symptoms as pt feeling "off balance." Pt states that her primary concern is her balance currently. She  is fearful of falling and would like to stop her "wobbling." She has a history of some dizziness a few weeks ago and attributes it to quick changes in position however has not recently had any issues. She does endorse a few bouts of vertigo since 10/2012 but not currently. Pt had spinal injections yesterday and denies any back pain currently. Pt has a tremor and follows with neurology. 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 He believes that the etiology is cerebellar tremors but essential tremors cannot be rulted out.     Currently in Pain? Yes   Pain Score 2    Pain Location Knee   Pain Orientation Right   Pain Descriptors / Indicators Aching     Treatment: Warm up on NuStep, L4x3 minutes; unbilled  Neuro Re-ed: 4 way box stepping,  x3 counterclockwise and clockwise direction, x6 with big lateral stepping only, min VCs to increase step to address glut med strength, CGA-minA for stability Heel/toe raises, 2x10,  0-1 HHA, CGA-minA for stability, encouraged to improve range of PF Instructed to ambulate with RW, CGA for safety, min VCs to perform with increased heel strike to decrease dragging of B feet and improve LE advancement.  Forward heel taps on dot, 2x10, no HHA, minA for safety, min VCs to decrease UE stress Forward stepping over objects x2, 1 HHA,  x3, 0 HHA x2, min VCs to decrease UE support, patient had LOB requiring modA x1, overall CGA-minA for stability Forward weight shifting with mirror feedback to maintain level hips, x10 each LE, mod VCs for set up and continuation of appropriate exercise, min tactile cues to maintain hips in level position  TherEx: Marches against red tband, x15, 2 HHA, min VCs to increase hip/knee flexion and stability Marches with no resistance, x15, 0 HHA, CGA-minA for stability, min VCs to increase range of motion and regain balance before continuing exercise Lateral weight shifts, 2x10, CGA for safety, min VCs to improve weight  shift and not just shift upper body in order to improve glut med activation Lateral walking with red tband resistance, CGA for safety, min VCs to increase step length BLE leg press, x10 at 110# and x10 at 120#, min VCs to perform eccentric movement slowly and maintain knees in appropriate position Standing hip extension, 2x10 each LE, red tband, min VCs to contract glut muscle during movement Sit to stand, x5 with 2.5# weighted bar moving into push/pulls once standing; min VCs to increase forward lean; increased in B knee pain so d/c                             PT Education - 11/12/15 1125    Education provided Yes   Education Details POC, continuing with HEP   Person(s) Educated Patient   Methods Explanation   Comprehension Verbalized understanding             PT Long Term Goals - 11/07/15 1143      PT LONG TERM GOAL #1   Title Patient will be independent in home exercise program to improve strength/mobility and balance for better functional independence and decrease fall risk   Time 4   Period Weeks   Status Partially Met     PT LONG TERM GOAL #2   Title Patient will decrease five times sit to stand test by at least 5 seconds indicating an increased LE strength and improved balance   Baseline 08/02/15: 31.98 seconds   Time 6   Period Weeks   Status Achieved     PT LONG TERM GOAL #3   Title Patient will decrease normal TUG test by at least 5 seconds indicating an increased LE strength and improved balance   Baseline 08/02/15: 32.89 seconds   Time 6   Period Weeks   Status Achieved     PT LONG TERM GOAL #4   Title Patient will increase 10 meter walk test by at least 0.13 m/s in order to improve gait speed for better community ambulation and to reduce fall risk   Baseline 08/02/15: 0.54 m/s   Time 4   Period Weeks   Status Partially Met     PT LONG TERM GOAL #5   Title Patient will improve Berg balance score to 44/56 in order to decrease risk for  falls.    Time 4   Period Weeks               Plan - 11/12/15 1126    Clinical Impression Statement The patient is able to participate in therapy with limited pain in her B knees and low back. She continues to demonstrate difficulty with hip musculature strength allowing for improved stability and requires mulitple verbal and tactile cues in order to realize where her body was in space. She requires minA for stability and  modAx1 with LOB during step overs. The patient would continue to benefit from skilled PT in order to address BLE strength, mobility with safety, and decrease her risk for falls.   Rehab Potential Fair   Clinical Impairments Affecting Rehab Potential Positive: motivation; Negative: chronicity, age, and central signs   PT Frequency 2x / week   PT Duration 4 weeks   PT Treatment/Interventions ADLs/Self Care Home Management;Aquatic Therapy;Canalith Repostioning;Gait training;Stair training;Functional mobility training;Therapeutic activities;Therapeutic exercise;Balance training;Neuromuscular re-education;Patient/family education;Manual techniques;Vestibular   PT Next Visit Plan Continued reinforcement and review of HEP; introduce LE strengthening exercises, single leg stance exercises   PT Home Exercise Plan HEP maintained, see patient instructions   Consulted and Agree with Plan of Care Patient      Patient will benefit from skilled therapeutic intervention in order to improve the following deficits and impairments:  Abnormal gait, Decreased balance, Dizziness, Decreased strength, Decreased mobility  Visit Diagnosis: Difficulty in walking, not elsewhere classified  Muscle weakness (generalized)  Dizziness and giddiness     Problem List Patient Active Problem List   Diagnosis Date Noted  . Carcinoma of left breast ([place]) 05/26/2014  . Adult BMI 30+ 03/20/2014  . Morbid obesity ([place]) 03/20/2014  . Arthritis of knee, degenerative 10/24/2013  . Focal lymphocytic  colitis 10/11/2013  . Lymphocytic colitis 10/11/2013  . Impingement syndrome of shoulder 09/05/2013  . Impingement syndrome of left shoulder 09/05/2013  . Combined fat and carbohydrate induced hyperlipemia 07/08/2013  . Benign hypertension 07/08/2013  . Diabetes mellitus, type 2 ([place]) 07/08/2013  . Type 2 diabetes mellitus ([place]) 07/08/2013  . Addison anemia 07/08/2013  . Bulge of lumbar disc without myelopathy 05/22/2013   Tilman Neat, SPT This entire session was performed under direct supervision and direction of a licensed therapist/therapist assistant . I have personally read, edited and approve of the note as written.  Trotter,Margaret  PT, DPT 11/13/2015, 8:49 AM  [place] MAIN [facility] SERVICES [address] [place], [place], [address] Phone: [phone]   Fax:  [phone]  Name: Janet Elliott MRN: [id number] Date of Birth: 04/01/1932

## 2015-11-14 ENCOUNTER — Ambulatory Visit: Payer: Medicare Other | Admitting: Physical Therapy

## 2015-11-14 ENCOUNTER — Encounter: Payer: Self-pay | Admitting: Physical Therapy

## 2015-11-14 DIAGNOSIS — R42 Dizziness and giddiness: Secondary | ICD-10-CM

## 2015-11-14 DIAGNOSIS — M6281 Muscle weakness (generalized): Secondary | ICD-10-CM

## 2015-11-14 DIAGNOSIS — R262 Difficulty in walking, not elsewhere classified: Secondary | ICD-10-CM | POA: Diagnosis not present

## 2015-11-14 NOTE — Therapy (Signed)
[place] [facility] MAIN [facility] SERVICES [address] [place], [place], [address] Phone: [phone]   Fax:  [phone]  Physical Therapy Treatment  Patient Details  Name: Janet Elliott MRN: [id number] Date of Birth: 1932/10/11 Referring Provider: Linus Salmons  Encounter Date: 11/14/2015      PT End of Session - 11/14/15 1118    Visit Number 22   Number of Visits 37   Date for PT Re-Evaluation 01/01/2016   Authorization Type g codes 03/15/22   PT Start Time 1031   PT Stop Time 1115   PT Time Calculation (min) 44 min   Equipment Utilized During Treatment Gait belt   Activity Tolerance Patient tolerated treatment well   Behavior During Therapy WFL for tasks assessed/performed      Past Medical History:  Diagnosis Date  . Anxiety   . Arthritis    osteoarthritis  . Breast cancer (HCC) 2002   Left  chemo/radistion  . Carcinoma of left breast (HCC)   . Lymphedema   . Sleep apnea     Past Surgical History:  Procedure Laterality Date  . BREAST BIOPSY Right 1980  . EYE SURGERY    . MASTECTOMY Left 2002    There were no vitals filed for this visit.      Subjective Assessment - 11/14/15 1020    Subjective Patient denies falls. She states she is feeling well, and she is getting a shot in her back for pain later today. Her blood sugars have been more controlled.    Pertinent History Pt reports that this past year she got hearing aids and while she was with the audiologist she reported she was having some dizziness. Audiologist recommended she see the ENT whom she saw 07/02/15. She reports that the ENT told her that her dizziness may be related to her inner ear function. No VNG testing reported however ENT note states that Dix-Hallpike test was negative. She was referred for vestibular therapy from ENT for dizziness. ENT note describes symptoms as pt feeling "off balance." Pt states that her primary concern is her balance currently. She is fearful of  falling and would like to stop her "wobbling." She has a history of some dizziness a few weeks ago and attributes it to quick changes in position however has not recently had any issues. She does endorse a few bouts of vertigo since 10/2012 but not currently. Pt had spinal injections yesterday and denies any back pain currently. Pt has a tremor and follows with neurology. 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 He believes that the etiology is cerebellar tremors but essential tremors cannot be rulted out.     Currently in Pain? No/denies      Treatment:  Warm up on NuStep, L2x3 minutes; unbilled Instructed to ambulate 4x10 meters with increased gait speed, CGA-minA for safety, with RW, min-mod VCs to increase hip/knee flexion and perform heel to toe to decrease catching of toes on ground. Step taps on 4" step, 2x10, min VCs to decrease UE support, minA for stability with modA x1 to regain balance Step ups on 4" step, 2x10, 1 HHA, min VCs to alternate LEs and continue with only 1 HHA due to patient only having 1 railing at home Static stance on half bolster, 2x15 seconds, attempted forward/backwards weight shifts x3, patient notes difficult for her knees and has "popping" noises so d/c  Tilt board lateral and ant/post taps x10 each direction and static holds x30 seconds each direction, CGA-minA  for stability, min VCs for body positioning in order to balance the board Lateral step ups on airex pad with weight shift towards that LE, x10 each LE, min VCs for initial set up and to perform mild knee bend in order to activate quad and glut med musculature Lateral walking with red tband, 2 HHA, min VCs to control trailing legs movement towards leading leg to activate glut med Step over objects x5, in // bars, 1 HHA, min VCs to move closer to objects before performing step over and to maintain feet in a forward position                             PT  Education - 11/14/15 1118    Education provided Yes   Education Details continuing with HEP, 1 more session   Person(s) Educated Patient   Methods Explanation   Comprehension Verbalized understanding             PT Long Term Goals - 11/07/15 1143      PT LONG TERM GOAL #1   Title Patient will be independent in home exercise program to improve strength/mobility and balance for better functional independence and decrease fall risk   Time 4   Period Weeks   Status Partially Met     PT LONG TERM GOAL #2   Title Patient will decrease five times sit to stand test by at least 5 seconds indicating an increased LE strength and improved balance   Baseline 08/02/15: 31.98 seconds   Time 6   Period Weeks   Status Achieved     PT LONG TERM GOAL #3   Title Patient will decrease normal TUG test by at least 5 seconds indicating an increased LE strength and improved balance   Baseline 08/02/15: 32.89 seconds   Time 6   Period Weeks   Status Achieved     PT LONG TERM GOAL #4   Title Patient will increase 10 meter walk test by at least 0.13 m/s in order to improve gait speed for better community ambulation and to reduce fall risk   Baseline 08/02/15: 0.54 m/s   Time 4   Period Weeks   Status Partially Met     PT LONG TERM GOAL #5   Title Patient will improve Berg balance score to 44/56 in order to decrease risk for falls.    Time 4   Period Weeks               Plan - 11/14/15 1119    Clinical Impression Statement The pt requires minA during balance activities to maintain stability and min VCs to decrease UE support throughout and to improve exercise technique. She is more focused during exercise today. During balance activities her knees "pop", but patient denies pain stating that they are weak. During instructed ambulation with min VCs to increase hip/knee flexion and land with toe, the patient has difficulty performing but improves with consistent VCs. The patient will benefit  from 1 more session to address safety with walker and increase heel to toe walking pattern to decrease risk for falls.    Rehab Potential Fair   Clinical Impairments Affecting Rehab Potential Positive: motivation; Negative: chronicity, age, and central signs   PT Frequency 2x / week   PT Duration 4 weeks   PT Treatment/Interventions ADLs/Self Care Home Management;Aquatic Therapy;Canalith Repostioning;Gait training;Stair training;Functional mobility training;Therapeutic activities;Therapeutic exercise;Balance training;Neuromuscular re-education;Patient/family education;Manual techniques;Vestibular   PT Next  Visit Plan Continued reinforcement and review of HEP; introduce LE strengthening exercises, single leg stance exercises   PT Home Exercise Plan HEP maintained, see patient instructions   Consulted and Agree with Plan of Care Patient      Patient will benefit from skilled therapeutic intervention in order to improve the following deficits and impairments:  Abnormal gait, Decreased balance, Dizziness, Decreased strength, Decreased mobility  Visit Diagnosis: Difficulty in walking, not elsewhere classified  Muscle weakness (generalized)  Dizziness and giddiness     Problem List Patient Active Problem List   Diagnosis Date Noted  . Carcinoma of left breast ([place]) 05/26/2014  . Adult BMI 30+ 03/20/2014  . Morbid obesity ([place]) 03/20/2014  . Arthritis of knee, degenerative 10/24/2013  . Focal lymphocytic colitis 10/11/2013  . Lymphocytic colitis 10/11/2013  . Impingement syndrome of shoulder 09/05/2013  . Impingement syndrome of left shoulder 09/05/2013  . Combined fat and carbohydrate induced hyperlipemia 07/08/2013  . Benign hypertension 07/08/2013  . Diabetes mellitus, type 2 ([place]) 07/08/2013  . Type 2 diabetes mellitus ([place]) 07/08/2013  . Addison anemia 07/08/2013  . Bulge of lumbar disc without myelopathy 05/22/2013   Tilman Neat, SPT This entire session was performed under  direct supervision and direction of a licensed therapist/therapist assistant . I have personally read, edited and approve of the note as written.  Trotter,Margaret PT, DPT 11/14/2015, 3:19 PM  [place] MAIN [facility] SERVICES [address] [place], [place], [address] Phone: [phone]   Fax:  [phone]  Name: RYLIN SAEZ MRN: [id number] Date of Birth: [birth year]

## 2015-11-19 ENCOUNTER — Ambulatory Visit: Payer: Medicare Other | Admitting: Physical Therapy

## 2015-11-19 ENCOUNTER — Encounter: Payer: Self-pay | Admitting: Physical Therapy

## 2015-11-19 DIAGNOSIS — R262 Difficulty in walking, not elsewhere classified: Secondary | ICD-10-CM | POA: Diagnosis not present

## 2015-11-19 DIAGNOSIS — R42 Dizziness and giddiness: Secondary | ICD-10-CM

## 2015-11-19 DIAGNOSIS — M6281 Muscle weakness (generalized): Secondary | ICD-10-CM

## 2015-11-19 NOTE — Therapy (Signed)
[place] MAIN [facility] SERVICES [address] [place], [place], [address] Phone: [phone]   Fax:  [phone]  Physical Therapy Treatment/Discharge Summary  Patient Details  Name: Janet Elliott MRN: [id number] Date of Birth: [birth year] Referring Provider: Beverly Gust  Encounter Date: 11/19/2015      PT End of Session - 11/19/15 1121    Visit Number 23   Number of Visits 37   Date for PT Re-Evaluation 12-24-2015   Authorization Type g codes 04-05-2022   PT Start Time 1032   PT Stop Time 1114   PT Time Calculation (min) 42 min   Equipment Utilized During Treatment Gait belt   Activity Tolerance Patient tolerated treatment well   Behavior During Therapy WFL for tasks assessed/performed      Past Medical History:  Diagnosis Date  . Anxiety   . Arthritis    osteoarthritis  . Breast cancer ([place]) 2002   Left  chemo/radistion  . Carcinoma of left breast ([place])   . Lymphedema   . Sleep apnea     Past Surgical History:  Procedure Laterality Date  . BREAST BIOPSY Right 1980  . EYE SURGERY    . MASTECTOMY Left 2002    There were no vitals filed for this visit.      Subjective Assessment - 11/19/15 1037    Subjective Patient denies falls. She got her cortisone shot in her back on Thursday.  She states she thinks she irritated her R knee after that since she did something and her R knee has been bothering her since. She states she has been going to the store with sister so that she can walk.    Pertinent History Pt reports that this past year she got hearing aids and while she was with the audiologist she reported she was having some dizziness. Audiologist recommended she see the ENT whom she saw 07/02/15. She reports that the ENT told her that her dizziness may be related to her inner ear function. No VNG testing reported however ENT note states that Dix-Hallpike test was negative. She was referred for vestibular therapy from ENT for  dizziness. ENT note describes symptoms as pt feeling "off balance." Pt states that her primary concern is her balance currently. She is fearful of falling and would like to stop her "wobbling." She has a history of some dizziness a few weeks ago and attributes it to quick changes in position however has not recently had any issues. She does endorse a few bouts of vertigo since 10/2012 but not currently. Pt had spinal injections yesterday and denies any back pain currently. Pt has a tremor and follows with neurology. 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 He believes that the etiology is cerebellar tremors but essential tremors cannot be rulted out.     Currently in Pain? Yes   Pain Score 1    Pain Location Knee   Pain Orientation Right   Pain Descriptors / Indicators Sore            [facility] PT Assessment - 11/19/15 0001      Standardized Balance Assessment   10 Meter Walk 14.4s=0.57ms, RW, CGA, <1.0 m/s indicates increased risk for falls      Treatment:  Warm up on NuStep L3x3 min, BUE/LE, history taken throughout Reviewed HEP: Supine bridges, x10, min VCs to reset between each movement Sidelying clam shells, x10 each LE with yellow tband, min VCs for initial set up Supine hip ABD with  yellow tband, x5 Mod VCs on how to tie yellow tband around LEs safely and more easily but able to do indep with cueing  TherEx and Neuro Re-ed: Tapping, weight shifts, picking up object and forward reach assessed on the Berg balance score with no changes in status 10 meter walk test was re-assessed with improvements, RW and CGA 4 way counterclockwise and clockwise stepping, x4 each direction, CGA for safety, min VCs to increase step length, 0 HHA Forward tapping each LE leading, 0 HHA, CGA for safety, min VCs for initiation of activity, x10 each LE Step over objects x3 objects, CGA for safety, x6 passes in // bars, min VCs to decrease UE support and move closer to object  before stepping, 0-2 HHA, step over step with 2 HHA, step to with 0 HHA                         PT Education - 11/19/15 1120    Education provided Yes   Education Details refreshing on HEP at home, transitioning to [employer]) Educated Patient   Methods Explanation   Comprehension Verbalized understanding             PT Long Term Goals - 11/19/15 1124      PT LONG TERM GOAL #1   Title Patient will be independent in home exercise program to improve strength/mobility and balance for better functional independence and decrease fall risk   Time 4   Period Weeks   Status Achieved     PT LONG TERM GOAL #2   Title Patient will decrease five times sit to stand test by at least 5 seconds indicating an increased LE strength and improved balance   Baseline 08/02/15: 31.98 seconds   Time 6   Period Weeks   Status Achieved     PT LONG TERM GOAL #3   Title Patient will decrease normal TUG test by at least 5 seconds indicating an increased LE strength and improved balance   Baseline 08/02/15: 32.89 seconds   Time 6   Period Weeks   Status Achieved     PT LONG TERM GOAL #4   Title Patient will increase 10 meter walk test by at least 0.13 m/s in order to improve gait speed for better community ambulation and to reduce fall risk   Baseline 08/02/15: 0.54 m/s   Time 4   Period Weeks   Status Achieved     PT LONG TERM GOAL #5   Title Patient will improve Berg balance score to 44/56 in order to decrease risk for falls.    Time 4   Period Weeks   Status Partially Met               Plan - 11/19/15 1121    Clinical Impression Statement The pt was discharged today due to decreased progress and transition to [employer]. The patient showed mild improvement in her 10 meter walk test but no changes in the areas addressed in Berg balance score. She was able to meet her goals except for her Merrilee Jansky balance score which she was off by 1 point. She  requires CGA-minA during stability still just for safety. Re-instructed in clam shells and bridges for HEP with min VCs to improve form and mod VCs about how to tie the yellow tband easier. Patient will be discharged to [facility] so she can maintain her gains made in therapy.    Rehab  Potential Fair   Clinical Impairments Affecting Rehab Potential Positive: motivation; Negative: chronicity, age, and central signs   PT Frequency 2x / week   PT Duration 4 weeks   PT Treatment/Interventions ADLs/Self Care Home Management;Aquatic Therapy;Canalith Repostioning;Gait training;Stair training;Functional mobility training;Therapeutic activities;Therapeutic exercise;Balance training;Neuromuscular re-education;Patient/family education;Manual techniques;Vestibular   PT Next Visit Plan Continued reinforcement and review of HEP; introduce LE strengthening exercises, single leg stance exercises   PT Home Exercise Plan HEP maintained, see patient instructions   Consulted and Agree with Plan of Care Patient      Patient will benefit from skilled therapeutic intervention in order to improve the following deficits and impairments:  Abnormal gait, Decreased balance, Dizziness, Decreased strength, Decreased mobility  Visit Diagnosis: Difficulty in walking, not elsewhere classified  Muscle weakness (generalized)  Dizziness and giddiness     Problem List Patient Active Problem List   Diagnosis Date Noted  . Carcinoma of left breast ([place]) 05/26/2014  . Adult BMI 30+ 03/20/2014  . Morbid obesity ([place]) 03/20/2014  . Arthritis of knee, degenerative 10/24/2013  . Focal lymphocytic colitis 10/11/2013  . Lymphocytic colitis 10/11/2013  . Impingement syndrome of shoulder 09/05/2013  . Impingement syndrome of left shoulder 09/05/2013  . Combined fat and carbohydrate induced hyperlipemia 07/08/2013  . Benign hypertension 07/08/2013  . Diabetes mellitus, type 2 ([place]) 07/08/2013  . Type 2 diabetes mellitus ([place])  07/08/2013  . Addison anemia 07/08/2013  . Bulge of lumbar disc without myelopathy 05/22/2013   Tilman Neat, SPT This entire session was performed under direct supervision and direction of a licensed therapist/therapist assistant . I have personally read, edited and approve of the note as written.  Trotter,Margaret PT, DPT 11/19/2015, 2:59 PM  [place] MAIN [facility] SERVICES [address] [place], [place], [address] Phone: [phone]   Fax:  [phone]  Name: Janet Elliott MRN: [id number] Date of Birth: 1932/09/22

## 2015-11-21 ENCOUNTER — Ambulatory Visit: Payer: Medicare Other | Admitting: Physical Therapy

## 2016-02-18 ENCOUNTER — Ambulatory Visit: Payer: Medicare Other | Attending: Unknown Physician Specialty | Admitting: Occupational Therapy

## 2016-02-18 DIAGNOSIS — I972 Postmastectomy lymphedema syndrome: Secondary | ICD-10-CM | POA: Insufficient documentation

## 2016-02-18 NOTE — Patient Instructions (Signed)
Setup pt with rep to get remeasured for new compression daytime garment - with wider top band And  Did contact rep about pump - hers is getting repaired Order for PT to eval and tx shoulder pain - pt positive for impingement

## 2016-02-18 NOTE — Therapy (Signed)
[place] [facility] [address] [place], [address] Phone: [phone]   Fax:  [phone]  Occupational Therapy Evaluation  Patient Details  Name: Janet Elliott MRN: [id number] Date of Birth: Dec 11, 1932 Referring Provider: Ouida Sills  Encounter Date: 02/18/2016      OT End of Session - 02/18/16 1712    Visit Number 1   Number of Visits 4   Date for OT Re-Evaluation 03/17/16   OT Start Time 1201   OT Stop Time 1301   OT Time Calculation (min) 60 min   Activity Tolerance Patient tolerated treatment well   Behavior During Therapy [facility] for tasks assessed/performed      Past Medical History:  Diagnosis Date  . Anxiety   . Arthritis    osteoarthritis  . Breast cancer ([place]) 2002   Left  chemo/radistion  . Carcinoma of left breast ([place])   . Lymphedema   . Sleep apnea     Past Surgical History:  Procedure Laterality Date  . BREAST BIOPSY Right 1980  . EYE SURGERY    . MASTECTOMY Left 2002    There were no vitals filed for this visit.      Subjective Assessment - 02/18/16 1702    Subjective  My pump broked maybe 3 months ago , they repairing it , but in meantime I bought smaller one to use - my sleeves we usually ask them to order new one - but the top hurts my upper arm - pain can increase to 8/10 - and it slides down -    Patient Stated Goals I want the pain better in my upper arm and a new sleeve that feels better    Currently in Pain? Yes   Pain Score 4    Pain Location Arm   Pain Orientation Left   Pain Descriptors / Indicators Aching   Pain Type Acute pain   Pain Onset More than a month ago   Pain Frequency Intermittent           [facility] OT Assessment - 02/18/16 0001      Assessment   Diagnosis L UE lymphedema   Referring Provider Ouida Sills   Onset Date 11/06/15   Prior Therapy --  lymphedema tx in +/- 2009 and 2015      Precautions   Precaution Comments L UE lymphedema     Balance  Screen   Has the patient fallen in the past 6 months No     Home  Environment   Lives With Other (Comment)  sister     Prior Function   Vocation Retired   Leisure Travel , R hand dominant          LYMPHEDEMA/ONCOLOGY QUESTIONNAIRE - 02/18/16 1217      Right Upper Extremity Lymphedema   15 cm Proximal to Olecranon Process 40.5 cm   10 cm Proximal to Olecranon Process 36 cm   Olecranon Process 29.4 cm   15 cm Proximal to Ulnar Styloid Process 28 cm   10 cm Proximal to Ulnar Styloid Process 25.4 cm   Just Proximal to Ulnar Styloid Process 19 cm   Across Hand at [employer] 19.2 cm   At [place] of 2nd Digit 6 cm   At [facility] of Thumb 6.4 cm     Left Upper Extremity Lymphedema   15 cm Proximal to Olecranon Process 38.2 cm   10 cm Proximal to Olecranon Process 37 cm   Olecranon Process  30.4 cm   15 cm Proximal to Ulnar Styloid Process 31 cm   10 cm Proximal to Ulnar Styloid Process 29.5 cm   Just Proximal to Ulnar Styloid Process 21.1 cm   Across Hand at [employer] 19 cm   At [place] of 2nd Digit 6 cm   At [facility] of Thumb 6.5 cm                       OT Education - 02/18/16 1709    Education provided Yes   Education Details findings of eval , plan for new daytime compression sleeve, PT order for shoulder pain , pump to be repaired    Person(s) Educated Patient;Other (comment)  sister   Methods Explanation;Demonstration;Tactile cues;Verbal cues   Comprehension Verbal cues required;Returned demonstration;Verbalized understanding             OT Long Term Goals - 02/18/16 1721      OT LONG TERM GOAL #1   Title Pt will be independent in wearing new compression garment with decrease discomfort at upper arm    Baseline daytime sleeve causing pain at upper arm and sliding down   Time 4   Period Weeks   Status New     OT LONG TERM GOAL #2   Title Pt will be setup with homeprogram to keep L UE lymphedema under control   Baseline pump getting repaired,  sleeve causing pain and sliding - L shoulder pain with ROM    Time 4   Status New               Plan - 02/18/16 1713    Clinical Impression Statement Pt present with stage 2  L UE lymphedema -pt was seen 2 x in the past around 2009 and 2015 - pt was setup with pump to use at home , and Jobst Elvarex Soft - pt was unable to use night time garments - pt report increase pain  in upper arm , where top band of daytime compression is , and garmets slides - pt do show increase pain with shoulder flexion , and weakness - pain with impingement test - consulted with PT that seen her in past for L shoulder issues - recommend PT order to eval and tx  - will contact Dr Ouida Sills , her pump is being repaired , and she has smaller one she is using, Rep will remeasure her for new garment - she did decrease in circumference  in L UE compare to April 2015    Rehab Potential Good   OT Frequency 1x / week   OT Duration 4 weeks   OT Treatment/Interventions Manual lymph drainage;Patient/family education;Self-care/ADL training   Plan PT order for L shoulder pain , setup Rep to measure her for Job[employer] elvarex soft with wider band , Rep to setup if pump is repaired    OT Home Exercise Plan see pt instruction   Consulted and Agree with Plan of Care Patient      Patient will benefit from skilled therapeutic intervention in order to improve the following deficits and impairments:  Increased edema, Pain, Decreased knowledge of use of DME  Visit Diagnosis: Postmastectomy lymphedema syndrome - Plan: Ot plan of care cert/re-cert      G-Codes - [id number]    Functional Assessment Tool Used circumference of L UE, pain , ROM , clinical judgement   Functional Limitation Self care   Self Care Current Status [id number]) At least 20  percent but less than 40 percent impaired, limited or restricted   Self Care Goal Status [id number]) At least 1 percent but less than 20 percent impaired, limited or restricted      Problem  List Patient Active Problem List   Diagnosis Date Noted  . Carcinoma of left breast ([place]) 05/26/2014  . Adult BMI 30+ 03/20/2014  . Morbid obesity ([place]) 03/20/2014  . Arthritis of knee, degenerative 10/24/2013  . Focal lymphocytic colitis 10/11/2013  . Lymphocytic colitis 10/11/2013  . Impingement syndrome of shoulder 09/05/2013  . Impingement syndrome of left shoulder 09/05/2013  . Combined fat and carbohydrate induced hyperlipemia 07/08/2013  . Benign hypertension 07/08/2013  . Diabetes mellitus, type 2 ([place]) 07/08/2013  . Type 2 diabetes mellitus ([place]) 07/08/2013  . Addison anemia 07/08/2013  . Bulge of lumbar disc without myelopathy 05/22/2013    Rosalyn Gess OTR/L,CLT 02/18/2016, 5:32 PM  [place] [facility] [address] [place], [address] Phone: [phone]   Fax:  [phone]  Name: HARRY SCHERER MRN: [id number] Date of Birth: May 04, 1932

## 2016-03-16 ENCOUNTER — Ambulatory Visit: Payer: Medicare Other | Admitting: Physical Therapy

## 2016-03-18 ENCOUNTER — Ambulatory Visit: Payer: Medicare Other | Admitting: Physical Therapy

## 2016-03-23 ENCOUNTER — Encounter: Payer: Self-pay | Admitting: Physical Therapy

## 2016-03-23 ENCOUNTER — Ambulatory Visit: Payer: Medicare Other | Attending: Unknown Physician Specialty | Admitting: Physical Therapy

## 2016-03-23 DIAGNOSIS — M6281 Muscle weakness (generalized): Secondary | ICD-10-CM | POA: Diagnosis present

## 2016-03-23 DIAGNOSIS — M25512 Pain in left shoulder: Secondary | ICD-10-CM | POA: Diagnosis present

## 2016-03-24 NOTE — Therapy (Signed)
[facility] [birth year][address][birth year][address] [place], [address] Phone: [phone]   Fax:  [phone]  Physical Therapy Evaluation  Patient Details  Name: Janet Elliott MRN: [id number] Date of Birth: 1932/10/19 Referring Provider: Frazier Richards MD  Encounter Date: 03/23/2016      PT End of Session - 03/23/16 1135    Visit Number 1   Number of Visits 4   Date for PT Re-Evaluation 04/20/16   Authorization Type 1   Authorization Time Period 10 (G codes)   PT Start Time 05/02/1037   PT Stop Time 1121   PT Time Calculation (min) 42 min   Activity Tolerance Patient tolerated treatment well   Behavior During Therapy [facility] for tasks assessed/performed      Past Medical History:  Diagnosis Date  . Anxiety   . Arthritis    osteoarthritis  . Breast cancer ([place]) May 02, 2000   Left  chemo/radistion  . Carcinoma of left breast ([place])   . Lymphedema   . Sleep apnea     Past Surgical History:  Procedure Laterality Date  . BREAST BIOPSY Right 1980  . EYE SURGERY    . MASTECTOMY Left 2000-05-02    There were no vitals filed for this visit.       Subjective Assessment - 03/23/16 1058    Subjective Patient reports she is having pain and difficulty with raising arm to shoulder level and behind back. She is complaining of sleeve of arm is bothering her and this is what seems to be limiting her funciton.    Pertinent History Patient reports history of left shoulder/arm pain that began 01/06/2016. She reports she is having problems with compression sleeve left UE and is working on resolving this with OT.    Limitations Other (comment)  raising arm above shoulder level, personal care activities, moving left UE   Diagnostic tests Brain MRI: Cerebral atrophy and chronic microvascular ischemia. No acute changes   Patient Stated Goals to have less pain in left arm and be able to perform daily tasks with less difficulty and pain in arm/shoulder   Currently in Pain? Yes   Pain Score 3    Pain Location Shoulder   Pain Orientation Left   Pain Descriptors / Indicators Aching   Pain Onset More than a month ago   Pain Frequency Intermittent   Aggravating Factors  using left UE for personal care, lifting, carrying   Pain Relieving Factors rest   Effect of Pain on Daily Activities limits normal function using left LE            [facility] PT Assessment - 03/23/16 1048      Assessment   Medical Diagnosis left shoulder, upper arm pain   Referring Provider Frazier Richards MD   Onset Date/Surgical Date 01/06/16   Hand Dominance Right   Next MD Visit unknown   Prior Therapy Yes, PT at [facility] for balance     Precautions   Precautions --   Precaution Comments LUE lymphedema     Restrictions   Weight Bearing Restrictions No     Balance Screen   Has the patient fallen in the past 6 months No   Has the patient had a decrease in activity level because of a fear of falling?  No   Is the patient reluctant to leave their home because of a fear of falling?  No  patient reports she is afraid to walk in bad weather  Home Environment   Living Environment Private residence   Living Arrangements Other relatives   Available Help at Discharge Family   Type of [employer] to enter   Entrance Stairs-Number of Steps [place] One level   [place] - 2 wheels     Prior Function   Level of Independence Independent with basic ADLs;Needs assistance with homemaking   Vocation Retired   Leisure Engineer, site   Overall Cognitive Status Within Functional Limits for tasks assessed       Objective: Posture: forward head, rounded shoulders Observation: Patient using rolling walker for support on arrival to clinic, compression sleeve in place left UE with lymphedema present AROM: bilateral shoulder forward flexion limited to 110 with pain in left UE, decreased IR  behind back left UE shoulder to rear pocket with pain left shoulder, functional ER with pain Strength: grossly assessed bilateral UE's major muscle groups WFL, decreased periscapular strength and control  Sensation: decreased sensation reported with tingling in right UE /hand, left UE without tingling reported Special tests:  Mild + empty can, Neers left shoulder for impingment Palpation: moderate tenderness and spasms palpable along left shoulder upper trapezius and posterior and anterior aspect  Outcome measures: QuickDash 36% (0 = no self perceived disability)           PT Education - 03/23/16 1130    Education provided Yes   Education Details discussed posture, weakness, POC, goals: HEP: scapular retraction, ROM exercises; ER sitting with correct posture and scapular control   Person(s) Educated Patient   Methods Explanation;Demonstration;Verbal cues;Handout   Comprehension Verbalized understanding;Returned demonstration;Verbal cues required             PT Long Term Goals - 03/23/16 1218      PT LONG TERM GOAL #1   Title Patient will demonstrate improved functional use left UE with decreased pain as indicated by QuickDash score of 20% or less by 04/20/2016   Baseline QuickDash score 36% self perceived impairment (0 = no self perceived disability)   Status New     PT LONG TERM GOAL #2   Title Patient will be indpendent with self management of pain, posture and exercise by 04/20/2016 to allow transition to home program    Baseline patient has limited knowedge of appropriate exercise, progression without assistance and cuing   Status New               Plan - 03/23/16 1212    Clinical Impression Statement Patient is an 83 year old right hand dominant female who presents with left UE shoulder and arm pain with decreased strength in shoulder girdle musculature and decreased posture awareness. She is also complaining of pain in upper arm due to compression sleeve  feeling too tight in upper arm band; she is working on resolving this with OT. She has QuickDash score of 36% indeicating moderate self perceived disabiltiy and will benefit from physical therapy intervention to address weakness, functional limitations in order to improve use of left UE.    Rehab Potential Fair   Clinical Impairments Affecting Rehab Potential (+)motivated, acute condition for left shoulder pain (-) chronic condition, age, lymphedema left UE   PT Frequency 1x / week   PT Duration 4 weeks   PT Treatment/Interventions Manual techniques;Patient/family education;Neuromuscular re-education;Therapeutic exercise   PT Next Visit Plan re assess, manual therapy soft tissue, therapeutic exercise for ROM,  strengthening   PT Home Exercise Plan scapular retraction, ROM exercises for shoulders, posture awareness, correction   Consulted and Agree with Plan of Care Patient      Patient will benefit from skilled therapeutic intervention in order to improve the following deficits and impairments:  Decreased strength, Pain, Impaired perceived functional ability, Increased muscle spasms, Decreased range of motion  Visit Diagnosis: Muscle weakness (generalized) - Plan: PT plan of care cert/re-cert  Left shoulder pain, unspecified chronicity - Plan: PT plan of care cert/re-cert      G-Codes - 85/88/50 1219/04/07    Functional Assessment Tool Used (Outpatient Only) quickdash, strength, ROM deficits, clinical judgment   Functional Limitation Carrying, moving and handling objects   Carrying, Moving and Handling Objects Current Status (Y7741) At least 20 percent but less than 40 percent impaired, limited or restricted   Carrying, Moving and Handling Objects Goal Status (O8786) At least 1 percent but less than 20 percent impaired, limited or restricted       Problem List Patient Active Problem List   Diagnosis Date Noted  . Carcinoma of left breast ([place]) 05/26/2014  . Adult BMI 30+ 03/20/2014  .  Morbid obesity ([place]) 03/20/2014  . Arthritis of knee, degenerative 10/24/2013  . Focal lymphocytic colitis 10/11/2013  . Lymphocytic colitis 10/11/2013  . Impingement syndrome of shoulder 09/05/2013  . Impingement syndrome of left shoulder 09/05/2013  . Combined fat and carbohydrate induced hyperlipemia 07/08/2013  . Benign hypertension 07/08/2013  . Diabetes mellitus, type 2 ([place]) 07/08/2013  . Type 2 diabetes mellitus ([place]) 07/08/2013  . Addison anemia 07/08/2013  . Bulge of lumbar disc without myelopathy 05/22/2013    Jomarie Longs PT 03/24/2016, 12:27 PM  [place] [facility] [address][birth year][address] [place], [address] Phone: [phone]   Fax:  [phone]  Name: KAZIYAH PARKISON MRN: [id number] Date of Birth: Oct 07, 1932

## 2016-04-01 ENCOUNTER — Encounter: Payer: Self-pay | Admitting: Physical Therapy

## 2016-04-01 ENCOUNTER — Ambulatory Visit: Payer: Medicare Other | Admitting: Physical Therapy

## 2016-04-01 DIAGNOSIS — M25512 Pain in left shoulder: Secondary | ICD-10-CM

## 2016-04-01 DIAGNOSIS — M6281 Muscle weakness (generalized): Secondary | ICD-10-CM

## 2016-04-01 NOTE — Therapy (Signed)
[place] [facility] [birth year][address][birth year][address] [place], [address] Phone: [phone]   Fax:  [phone]  Physical Therapy Treatment  Patient Details  Name: Janet Elliott MRN: [id number] Date of Birth: June 11, 1932 Referring Provider: Frazier Richards MD  Encounter Date: 04/01/2016      PT End of Session - 04/01/16 1130    Visit Number 2   Number of Visits 4   Date for PT Re-Evaluation 04/20/16   Authorization Type 2   Authorization Time Period 10 (G codes)   PT Start Time 04-12-1119   PT Stop Time 04/12/50   PT Time Calculation (min) 31 min   Activity Tolerance Patient tolerated treatment well   Behavior During Therapy [facility] for tasks assessed/performed      Past Medical History:  Diagnosis Date  . Anxiety   . Arthritis    osteoarthritis  . Breast cancer ([place]) Apr 11, 2000   Left  chemo/radistion  . Carcinoma of left breast ([place])   . Lymphedema   . Sleep apnea     Past Surgical History:  Procedure Laterality Date  . BREAST BIOPSY Right 1980  . EYE SURGERY    . MASTECTOMY Left 2000/04/11    There were no vitals filed for this visit.      Subjective Assessment - 04/01/16 1126    Subjective Patient reports she is waiting on someone to call her to measure her left arm for new sleeve. She is fatigued today due to a lot of activity yesterday. She reports she is improving with less pain and able to do more with left UE with less difficulty.   Pertinent History Patient reports history of left shoulder/arm pain that began 01/06/2016. She reports she is having problems with compression sleeve left UE and is working on resolving this with OT.    Limitations Other (comment)  raising arm above shoulder level, personal care activities, moving left UE   Diagnostic tests Brain MRI: Cerebral atrophy and chronic microvascular ischemia. No acute changes   Patient Stated Goals to have less pain in left arm and be able to perform daily tasks with less  difficulty and pain in arm/shoulder   Currently in Pain? No/denies      Objective: Special tests: mildly + Neer impingement and empty can testing on left UE Palpation: increased tenderness anterior aspect left shoulder, + spasms left upper trapezius AROM: left shoulder decreased ER/abduction to reach behind on left, left shoulder forward elevation to 115 degrees  Treatment: Manual therapy:  With patient seated with left UE supported: STM performed to left shoulder girdle, upper trapezius, anterior aspect left shoulder x 10 min.: goal; pain, spasms Therapeutic exercise:  Patient performed exercises with VC, tactile cues and demonstration of therapist Sitting:  Shoulder shrugs with controlled motion and cuing to perform through full ROM and to decrease muscle tension in upper trapezius x10 Scapular retraction with red resistive band x 15 reps ER bilateral UE's with red resisistive band x 10 reps  Patient response to treatment: patient demonstrated improved technique with exercises with minimal VC for correct alignment. Patient with decreased spasms by 30% following STM. Improved motor control with repetition and cuing.        PT Education - 04/01/16 1129    Education provided Yes   Education Details Reassessed exercises and added shoulder shrugs, scapular retraction, ER of shoulders with red resistive tubing   Person(s) Educated Patient   Methods Explanation;Demonstration;Verbal cues   Comprehension Verbalized understanding;Returned demonstration;Verbal  cues required             PT Long Term Goals - 03/23/16 1218      PT LONG TERM GOAL #1   Title Patient will demonstrate improved functional use left UE with decresaed pain as indicated by QuickDash score of 20% or less by 04/20/2016   Baseline QuickDash score 36% self perceived impairment (0 = no self perceived disability)   Status New     PT LONG TERM GOAL #2   Title Patient will be indpendent with self management of  pain, posture and exercise by 04/20/2016 to allow transition to home program    Baseline patient has limited knowedge of appropriate exercise, progression without assistance and cuing   Status New               Plan - 04/01/16 1131    Clinical Impression Statement Patient with improved motion with decreased soreness in left shoulder, advance strengthening for scapular retraction and ER, added shrugging for shoulder with improved motor control and decreased pain with reaching to opposite shoulder. She should continue to improve and achieve goals with continued physical therapy intervention.    Rehab Potential Fair   Clinical Impairments Affecting Rehab Potential (+)motivated, acute condition for left shoulder pain (-) chronic condition, age, lymphedema left UE   PT Frequency 1x / week   PT Duration 4 weeks   PT Treatment/Interventions Manual techniques;Patient/family education;Neuromuscular re-education;Therapeutic exercise   PT Next Visit Plan re assess, manual therapy soft tissue, therapeutic exercise for ROM, strengthening   PT Home Exercise Plan scapular retraction, ROM exercises for shoulders, posture awareness, correction      Patient will benefit from skilled therapeutic intervention in order to improve the following deficits and impairments:  Decreased strength, Pain, Impaired perceived functional ability, Increased muscle spasms, Decreased range of motion  Visit Diagnosis: Muscle weakness (generalized)  Left shoulder pain, unspecified chronicity     Problem List Patient Active Problem List   Diagnosis Date Noted  . Carcinoma of left breast ([place]) 05/26/2014  . Adult BMI 30+ 03/20/2014  . Morbid obesity ([place]) 03/20/2014  . Arthritis of knee, degenerative 10/24/2013  . Focal lymphocytic colitis 10/11/2013  . Lymphocytic colitis 10/11/2013  . Impingement syndrome of shoulder 09/05/2013  . Impingement syndrome of left shoulder 09/05/2013  . Combined fat and  carbohydrate induced hyperlipemia 07/08/2013  . Benign hypertension 07/08/2013  . Diabetes mellitus, type 2 ([place]) 07/08/2013  . Type 2 diabetes mellitus ([place]) 07/08/2013  . Addison anemia 07/08/2013  . Bulge of lumbar disc without myelopathy 05/22/2013    Jomarie Longs PT 04/02/2016, 8:13 AM  [place] [facility] [address] [place], [address] Phone: [phone]   Fax:  [phone]  Name: Janet Elliott MRN: [id number] Date of Birth: 03/23/1932

## 2016-04-06 ENCOUNTER — Encounter: Payer: Self-pay | Admitting: Physical Therapy

## 2016-04-06 ENCOUNTER — Ambulatory Visit: Payer: Medicare Other | Attending: Unknown Physician Specialty | Admitting: Physical Therapy

## 2016-04-06 DIAGNOSIS — M25512 Pain in left shoulder: Secondary | ICD-10-CM | POA: Diagnosis present

## 2016-04-06 DIAGNOSIS — M6281 Muscle weakness (generalized): Secondary | ICD-10-CM | POA: Diagnosis present

## 2016-04-07 NOTE — Therapy (Signed)
[facility] Apr [birth year], [address] [place], [address] Phone: [phone]   Fax:  [phone]  Physical Therapy Treatment  Patient Details  Name: Janet Elliott MRN: [id number] Date of Birth: [birth year] Referring Provider: Frazier Richards MD  Encounter Date: 04/06/2016      PT End of Session - 04/06/16 1200    Visit Number 3   Number of Visits 4   Date for PT Re-Evaluation 04/20/16   Authorization Type 3   Authorization Time Period 10 (G codes)   PT Start Time 04/18/1131   PT Stop Time April 18, 1203   PT Time Calculation (min) 32 min   Activity Tolerance Patient tolerated treatment well   Behavior During Therapy [facility] for tasks assessed/performed      Past Medical History:  Diagnosis Date  . Anxiety   . Arthritis    osteoarthritis  . Breast cancer ([place]) 2000-04-17   Left  chemo/radistion  . Carcinoma of left breast ([place])   . Lymphedema   . Sleep apnea     Past Surgical History:  Procedure Laterality Date  . BREAST BIOPSY Right 1980  . EYE SURGERY    . MASTECTOMY Left 04-17-00    There were no vitals filed for this visit.      Subjective Assessment - 04/06/16 1133    Subjective Patient reports both shoulders are hurting today may be due to cleaning out filing cabinet.    Pertinent History Patient reports history of left shoulder/arm pain that began 01/06/2016. She reports she is having problems with compression sleeve left UE and is working on resolving this with OT.    Limitations Other (comment)  raising arm above shoulder level, personal care activities, moving left UE   Diagnostic tests Brain MRI: Cerebral atrophy and chronic microvascular ischemia. No acute changes   Patient Stated Goals to have less pain in left arm and be able to perform daily tasks with less difficulty and pain in arm/shoulder   Currently in Pain? Yes   Pain Score 3    Pain Location Shoulder   Pain Orientation Left   Pain Descriptors / Indicators  Aching   Pain Type Acute pain   Pain Onset More than a month ago   Pain Frequency Intermittent      Objective: Palpation: increased tenderness anterior aspect left shoulder, + spasms left upper trapezius AROM: left shoulder decreased ER/abduction to reach behind on left, left shoulder forward elevation to 110 degrees  Treatment: Manual therapy:  With patient seated with left UE supported: STM performed to left shoulder girdle, upper trapezius, anterior aspect left shoulder x 12 min.: goal; pain, spasms Therapeutic exercise:  Patient performed exercises with VC, tactile cues and demonstration of therapist Sitting:  Shoulder shrugs with controlled motion and cuing to perform through full ROM and to decrease muscle tension in upper trapezius x10 Scapular retraction with red resistive band x 15 reps ER bilateral UE's with red resisistive band x 10 reps  Demonstrated use of SPC for patient as she would like to walk with least AD in the future; patient attempted to walk with cane x 3 steps with contact guard of therapist unable to walk without significant difficulty  Patient response to treatment: Patient with improved technique with exercises with repetition and minimal VC and demonstration. Decreased pain/tenderness to very mild following STM. Verbalized good understanding of home program. Unable to walk safely with [facility] therefore will continue with walker for now.  PT Education - 04/06/16 1137    Education provided Yes   Education Details Reinforced exercises with band, shoulder shrugs, retraction, ER of shoulders   Person(s) Educated Patient   Methods Explanation;Demonstration;Verbal cues   Comprehension Verbalized understanding;Returned demonstration;Verbal cues required             PT Long Term Goals - 03/23/16 1218      PT LONG TERM GOAL #1   Title Patient will demonstrate improved functional use left UE with decresaed pain as indicated by QuickDash score of  20% or less by 04/20/2016   Baseline QuickDash score 36% self perceived impairment (0 = no self perceived disability)   Status New     PT LONG TERM GOAL #2   Title Patient will be indpendent with self management of pain, posture and exercise by 04/20/2016 to allow transition to home program    Baseline patient has limited knowedge of appropriate exercise, progression without assistance and cuing   Status New               Plan - 04/06/16 1205    Clinical Impression Statement Patient with decreased soreness, spasms in left shoulder with improved ability to raise arm at end of session with less difficulty. Improved motor control and technique with exercises with minimal cuing indicating good carry over between session.    Rehab Potential Fair   Clinical Impairments Affecting Rehab Potential (+)motivated, acute condition for left shoulder pain (-) chronic condition, age, lymphedema left UE   PT Frequency 1x / week   PT Duration 4 weeks   PT Treatment/Interventions Manual techniques;Patient/family education;Neuromuscular re-education;Therapeutic exercise   PT Next Visit Plan re assess, manual therapy soft tissue, therapeutic exercise for ROM, strengthening   PT Home Exercise Plan scapular retraction, ROM exercises for shoulders, posture awareness, correction      Patient will benefit from skilled therapeutic intervention in order to improve the following deficits and impairments:  Decreased strength, Pain, Impaired perceived functional ability, Increased muscle spasms, Decreased range of motion  Visit Diagnosis: Muscle weakness (generalized)  Left shoulder pain, unspecified chronicity     Problem List Patient Active Problem List   Diagnosis Date Noted  . Carcinoma of left breast ([place]) 05/26/2014  . Adult BMI 30+ 03/20/2014  . Morbid obesity ([place]) 03/20/2014  . Arthritis of knee, degenerative 10/24/2013  . Focal lymphocytic colitis 10/11/2013  . Lymphocytic colitis 10/11/2013   . Impingement syndrome of shoulder 09/05/2013  . Impingement syndrome of left shoulder 09/05/2013  . Combined fat and carbohydrate induced hyperlipemia 07/08/2013  . Benign hypertension 07/08/2013  . Diabetes mellitus, type 2 ([place]) 07/08/2013  . Type 2 diabetes mellitus ([place]) 07/08/2013  . Addison anemia 07/08/2013  . Bulge of lumbar disc without myelopathy 05/22/2013    Jomarie Longs PT 04/07/2016, 2:09 PM  [place] [facility] [address] [place], [address] Phone: [phone]   Fax:  [phone]  Name: Janet Elliott MRN: [id number] Date of Birth: Feb 13, 1932

## 2016-04-09 ENCOUNTER — Encounter: Payer: Medicare Other | Admitting: Physical Therapy

## 2016-04-14 ENCOUNTER — Ambulatory Visit: Payer: Medicare Other | Admitting: Physical Therapy

## 2016-04-14 ENCOUNTER — Encounter: Payer: Self-pay | Admitting: Physical Therapy

## 2016-04-14 DIAGNOSIS — M6281 Muscle weakness (generalized): Secondary | ICD-10-CM | POA: Diagnosis not present

## 2016-04-14 DIAGNOSIS — M25512 Pain in left shoulder: Secondary | ICD-10-CM

## 2016-04-15 NOTE — Therapy (Signed)
[facility] [address], [place], [address] Phone: [phone]   Fax:  [phone]  Physical Therapy Treatment/Discharge Summary  Patient Details  Name: Janet Elliott MRN: [id number] Date of Birth: 03/20/1932 Referring Provider: Frazier Richards MD  Encounter Date: 04/14/2016   Patient began physical therapy on 03/23/2016 and has attended 4 sessions with goals achieved and patient able to perform exercises and self manage symptoms independently. Plan: discharge from physical therapy at this time.       PT End of Session - 04/14/16 1127/04/05    Visit Number 4   Number of Visits 4   Date for PT Re-Evaluation 04/20/16   Authorization Type 4   Authorization Time Period 10 (G codes)   PT Start Time April 05, 1115   PT Stop Time 1155   PT Time Calculation (min) 38 min   Activity Tolerance Patient tolerated treatment well   Behavior During Therapy WFL for tasks assessed/performed      Past Medical History:  Diagnosis Date  . Anxiety   . Arthritis    osteoarthritis  . Breast cancer ([place]) April 04, 2000   Left  chemo/radistion  . Carcinoma of left breast ([place])   . Lymphedema   . Sleep apnea     Past Surgical History:  Procedure Laterality Date  . BREAST BIOPSY Right 1980  . EYE SURGERY    . MASTECTOMY Left April 04, 2000    There were no vitals filed for this visit.      Subjective Assessment - 04/14/16 1118    Subjective Patient reports she feels she is ready for discharge from physical therapy to continue indendent self management. She is still working on getting her left UE sleeve re measured to help with left UE pain. Overall she reports she is at least 75% improved from when she began therapy treatment.    Pertinent History Patient reports history of left shoulder/arm pain that began 01/06/2016. She reports she is having problems with compression sleeve left UE and is working on resolving this with OT.    Limitations Other  (comment)  raising arm above shoulder level, personal care activities, moving left UE   Diagnostic tests Brain MRI: Cerebral atrophy and chronic microvascular ischemia. No acute changes   Patient Stated Goals to have less pain in left arm and be able to perform daily tasks with less difficulty and pain in arm/shoulder   Currently in Pain? No/denies      Objective: AROM: left UE limited to 110 degrees flexion with minimal pain in left UE/shoulder reported Strength; grossly assessed due to lymphedema present left UE; both UE's major muscle groups WFL Posture: improved with decreased rounded shoulders, head more midline and decreased forward head posture Palpation: + tenderness and spasms along left upper trapezius and anterior aspect of left shoulder, decreased from previous session Outcome measure: QuickDash: 25% self perceived impairment: initially was 36% (significant change achieved with therapy intervention)  Treatment:   Manual therapy:  With patient seated with left UE supported: STM performed to left shoulder girdle, upper trapezius, anterior aspect left shoulder x 8 min.: goal; decrease spasms, improve ROM Therapeutic exercise: Patient performed exercises with VC, tactile cues and demonstration of therapist; goals: independent with HEP Sitting:  Shoulder shrugs with controlled motion and cuing to perform through full ROM and to decrease muscle tension in upper trapezius x10 Scapular retraction with green resistive band x 15 reps ER bilateral UE's with green resisistive band x 10 reps palloff  press forward press with green resistive band x 10 reps  Patient response to treatment: Patient demonstrated good posture, alignment with exercises with minimal VC and demonstration. Spasms and tenderness decreased to mild/non palpable at end of treatment. Patient verbalized good understanding of energy conservation, modification of exercises to continue with independent self management at  home.         PT Education - 05-08-16 1006    Education provided Yes   Education Details Re assessed home program to continue with posture, exercises   Person(s) Educated Patient   Methods Explanation   Comprehension Verbalized understanding             PT Long Term Goals - 05-08-2016 1150      PT LONG TERM GOAL #1   Title Patient will demonstrate improved functional use left UE with decresaed pain as indicated by QuickDash score of 20% or less by 04/20/2016   Baseline QuickDash score 36% self perceived impairment (0 = no self perceived disability)   Status Achieved     PT LONG TERM GOAL #2   Title Patient will be indpendent with self management of pain, posture and exercise by 04/20/2016 to allow transition to home program    Baseline patient has limited knowedge of appropriate exercise, progression without assistance and cuing   Status Achieved               Plan - May 08, 2016 1129    Clinical Impression Statement Patient demonstrated good understanding of home exercises and is working on getting sleeve re measured to assist with pain in left UE. She has achieved goals for improved functional use left UE with minimal to no pain and independent with home program/self management of symptoms. Her current impairment level is <20% based on QuickDash, strength, ROM and clinical judgment. No further therapy is recommended at this time.    Rehab Potential Fair   Clinical Impairments Affecting Rehab Potential (+)motivated, acute condition for left shoulder pain (-) chronic condition, age, lymphedema left UE   PT Frequency 1x / week   PT Duration 4 weeks   PT Treatment/Interventions Manual techniques;Patient/family education;Neuromuscular re-education;Therapeutic exercise   PT Next Visit Plan Discharged    PT Home Exercise Plan scapular retraction, ROM exercises for shoulders, posture awareness, correction   Consulted and Agree with Plan of Care Patient      Patient will  benefit from skilled therapeutic intervention in order to improve the following deficits and impairments:  Decreased strength, Pain, Impaired perceived functional ability, Increased muscle spasms, Decreased range of motion  Visit Diagnosis: Muscle weakness (generalized)  Left shoulder pain, unspecified chronicity       G-Codes - 05/08/16 1155    Functional Assessment Tool Used (Outpatient Only) quickdash, strength, ROM deficits, clinical judgment   Functional Limitation Carrying, moving and handling objects   Carrying, Moving and Handling Objects Goal Status (U2025) At least 1 percent but less than 20 percent impaired, limited or restricted   Carrying, Moving and Handling Objects Discharge Status [id number]) At least 1 percent but less than 20 percent impaired, limited or restricted      Problem List Patient Active Problem List   Diagnosis Date Noted  . Carcinoma of left breast ([place]) 05/26/2014  . Adult BMI 30+ 03/20/2014  . Morbid obesity ([place]) 03/20/2014  . Arthritis of knee, degenerative 10/24/2013  . Focal lymphocytic colitis 10/11/2013  . Lymphocytic colitis 10/11/2013  . Impingement syndrome of shoulder 09/05/2013  . Impingement syndrome of left shoulder 09/05/2013  .  Combined fat and carbohydrate induced hyperlipemia 07/08/2013  . Benign hypertension 07/08/2013  . Diabetes mellitus, type 2 ([place]) 07/08/2013  . Type 2 diabetes mellitus ([place]) 07/08/2013  . Addison anemia 07/08/2013  . Bulge of lumbar disc without myelopathy 05/22/2013    Jomarie Longs PT 04/15/2016, 10:11 AM  [facility] [address], [place], [address] Phone: [phone]   Fax:  [phone]  Name: TRYNITY SKOUSEN MRN: [id number] Date of Birth: 11-07-1932

## 2016-04-16 ENCOUNTER — Encounter: Payer: Medicare Other | Admitting: Physical Therapy

## 2016-05-11 ENCOUNTER — Other Ambulatory Visit: Payer: Self-pay | Admitting: Oncology

## 2016-05-11 ENCOUNTER — Ambulatory Visit
Admission: RE | Admit: 2016-05-11 | Discharge: 2016-05-11 | Disposition: A | Discharge status: Home / Self Care | Payer: Medicare Other | Source: Ambulatory Visit | Attending: Oncology | Admitting: Oncology

## 2016-05-11 DIAGNOSIS — Z1231 Encounter for screening mammogram for malignant neoplasm of breast: Secondary | ICD-10-CM | POA: Diagnosis present

## 2016-05-11 DIAGNOSIS — C50912 Malignant neoplasm of unspecified site of left female breast: Secondary | ICD-10-CM

## 2016-05-28 ENCOUNTER — Other Ambulatory Visit: Payer: Self-pay | Admitting: *Deleted

## 2016-05-28 ENCOUNTER — Inpatient Hospital Stay: Payer: Medicare Other | Attending: Oncology | Admitting: Oncology

## 2016-05-28 ENCOUNTER — Inpatient Hospital Stay: Payer: Medicare Other

## 2016-05-28 VITALS — BP 151/75 | HR 77 | Temp 98.1°F | Wt 202.0 lb

## 2016-05-28 DIAGNOSIS — G8929 Other chronic pain: Secondary | ICD-10-CM | POA: Diagnosis not present

## 2016-05-28 DIAGNOSIS — M1990 Unspecified osteoarthritis, unspecified site: Secondary | ICD-10-CM | POA: Diagnosis not present

## 2016-05-28 DIAGNOSIS — M129 Arthropathy, unspecified: Secondary | ICD-10-CM

## 2016-05-28 DIAGNOSIS — Z853 Personal history of malignant neoplasm of breast: Secondary | ICD-10-CM | POA: Insufficient documentation

## 2016-05-28 DIAGNOSIS — Z9012 Acquired absence of left breast and nipple: Secondary | ICD-10-CM | POA: Insufficient documentation

## 2016-05-28 DIAGNOSIS — G4730 Sleep apnea, unspecified: Secondary | ICD-10-CM | POA: Diagnosis not present

## 2016-05-28 DIAGNOSIS — Z7982 Long term (current) use of aspirin: Secondary | ICD-10-CM | POA: Diagnosis not present

## 2016-05-28 DIAGNOSIS — C50912 Malignant neoplasm of unspecified site of left female breast: Secondary | ICD-10-CM

## 2016-05-28 DIAGNOSIS — F419 Anxiety disorder, unspecified: Secondary | ICD-10-CM | POA: Diagnosis not present

## 2016-05-28 DIAGNOSIS — Z7984 Long term (current) use of oral hypoglycemic drugs: Secondary | ICD-10-CM | POA: Insufficient documentation

## 2016-05-28 DIAGNOSIS — Z9221 Personal history of antineoplastic chemotherapy: Secondary | ICD-10-CM | POA: Diagnosis not present

## 2016-05-28 DIAGNOSIS — I890 Lymphedema, not elsewhere classified: Secondary | ICD-10-CM | POA: Diagnosis not present

## 2016-05-28 DIAGNOSIS — Z170 Estrogen receptor positive status [ER+]: Secondary | ICD-10-CM

## 2016-05-28 DIAGNOSIS — M810 Age-related osteoporosis without current pathological fracture: Secondary | ICD-10-CM

## 2016-05-28 DIAGNOSIS — Z923 Personal history of irradiation: Secondary | ICD-10-CM | POA: Diagnosis not present

## 2016-05-28 NOTE — Progress Notes (Unsigned)
Met b

## 2016-05-28 NOTE — Progress Notes (Signed)
Patient denies pain or discomfort at this time, vitals documented.  Patient ambulates with a walker, brought to exam room 4, accompanied by her daughter.

## 2016-05-29 ENCOUNTER — Encounter: Payer: Self-pay | Admitting: Oncology

## 2016-05-29 NOTE — Progress Notes (Signed)
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  2.  Estrogen receptor positive more than 95%.  3.  Progesterone receptor positive more than 95%.  4.  HER-2/neu 1+. FISH study negative.  5.  Started on chemotherapy with Epirubicin and Taxotere in June of 2002 as neoadjuvant treatment.  6.  Surgery with mastectomy in September of 2002.  Residual carcinoma involving skin and nipple.  Seven out of fourteen lymph nodes are positive.  7.  Started on radiation therapy to the chest wall in November of 2002.  8.  Finished radiation therapy on December 31, 2000.  Received 5,040 cGy in 28 fractions.  9.Femara has been discontinued in December of 2012 9.  Arimidex 1 tablet daily completed Sept. 2007.  10. Extended adjuvant hormonal therapy with Letrozole initiated 10/07. 11. Antihormone therapy discontinued  12lymphedema of left upper extremity.  Recurrent cellulitis      Carcinoma of left breast ([place])   05/25/2000 Initial Diagnosis    Carcinoma of left breast        Interval history- she continues to wear sleeve in her left arm for lymphedema. She has has been recommended pumping manouver once a day by physical therapy which has been helping. No episodes of cellulitis in the last 2 years. Has chronic joint pain  ECOG PS- 2 Pain scale- 0   Review of systems- Review of Systems  Constitutional: Negative for chills, fever, malaise/fatigue and weight loss.  HENT: Negative for congestion, ear discharge and nosebleeds.     Eyes: Negative for blurred vision.  Respiratory: Negative for cough, hemoptysis, sputum production, shortness of breath and wheezing.   Cardiovascular: Negative for chest pain, palpitations, orthopnea and claudication.  Gastrointestinal: Negative for abdominal pain, blood in stool, constipation, diarrhea, heartburn, melena, nausea and vomiting.  Genitourinary: Negative for dysuria, flank pain, frequency, hematuria and urgency.  Musculoskeletal: Negative for back pain, joint pain and myalgias.  Skin: Negative for rash.  Neurological: Negative for dizziness, tingling, focal weakness, seizures, weakness and headaches.  Endo/Heme/Allergies: Does not bruise/bleed easily.  Psychiatric/Behavioral: Negative for depression and suicidal ideas. The patient does not have insomnia.      Current treatment- observation  Allergies  Allergen Reactions  . Codeine     Other reaction(s): Unknown  . Statins Other (See Comments)     Past Medical History:  Diagnosis Date  . Anxiety   . Arthritis    osteoarthritis  . Breast cancer ([place]) 2002   Left  chemo/radistion  . Carcinoma of left breast ([place])   . Lymphedema   . Sleep apnea      Past Surgical History:  Procedure Laterality Date  . BREAST BIOPSY Right 1980  . EYE SURGERY    . MASTECTOMY Left 2002    Social History   Social History  . Marital status: Single    Spouse name: N/A  . Number of children: N/A  . Years of education: N/A   Occupational History  . Not on file.   Social History Main Topics  . Smoking status:  Never Smoker  . Smokeless tobacco: Never Used  . Alcohol use No  . Drug use: Unknown  . Sexual activity: Not on file   Other Topics Concern  . Not on file   Social History Narrative  . No narrative on file    Family History  Problem Relation Age of Onset  . Breast cancer Maternal Aunt   . Breast cancer Cousin   . Breast cancer Cousin      Current Outpatient Prescriptions:  .  amoxicillin-clavulanate  (AUGMENTIN) 875-125 MG tablet, 875 tablets., Disp: , Rfl:  .  aspirin EC 81 MG tablet, Take 81 mg by mouth., Disp: , Rfl:  .  co-enzyme Q-10 30 MG capsule, Take 100 mg by mouth 2 (two) times daily. , Disp: , Rfl:  .  cyanocobalamin (V-R VITAMIN B-12) 500 MCG tablet, Take 500 mcg by mouth., Disp: , Rfl:  .  diclofenac sodium (VOLTAREN) 1 % GEL, Apply 2 g topically 4 (four) times daily. Reported on 05/29/2015, Disp: , Rfl:  .  diclofenac sodium (VOLTAREN) 1 % GEL, Apply topically., Disp: , Rfl:  .  esomeprazole (NEXIUM) 20 MG capsule, Take 20 mg by mouth daily. Reported on 05/29/2015, Disp: , Rfl:  .  fexofenadine-pseudoephedrine (ALLEGRA-D 24) 180-240 MG per 24 hr tablet, Take 1 tablet by mouth daily. Reported on 05/29/2015, Disp: , Rfl:  .  glimepiride (AMARYL) 4 MG tablet, Take 4 mg by mouth daily with breakfast. Reported on 05/29/2015, Disp: , Rfl:  .  glucose blood (ONE TOUCH ULTRA TEST) test strip, CHECK BLOOD SUGAR TWICE A DAY, Disp: , Rfl:  .  losartan (COZAAR) 100 MG tablet, Take 100 mg by mouth daily. Reported on 05/29/2015, Disp: , Rfl:  .  PARoxetine (PAXIL-CR) 25 MG 24 hr tablet, Take 25 mg by mouth daily. Reported on 05/29/2015, Disp: , Rfl:  .  pregabalin (LYRICA) 50 MG capsule, Take 50 mg by mouth 2 (two) times daily. Reported on 05/29/2015, Disp: , Rfl:  .  saxagliptin HCl (ONGLYZA) 2.5 MG TABS tablet, Take 2.5 mg by mouth daily. Reported on 05/29/2015, Disp: , Rfl:  .  vitamin B-12 (CYANOCOBALAMIN) 250 MCG tablet, Take 250 mcg by mouth daily. Reported on 05/29/2015, Disp: , Rfl:  .  Vitamin D, Ergocalciferol, (DRISDOL) 50000 UNITS CAPS capsule, Take 50,000 Units by mouth every 7 (seven) days. Reported on 05/29/2015, Disp: , Rfl:  .  glimepiride (AMARYL) 4 MG tablet, TAKE ONE TABLET BY MOUTH DAILY WITH BREAKFAST, Disp: , Rfl:  .  Lancets Misc. (UNISTIK 2 NORMAL) MISC, USE AS DIRECTED, Disp: , Rfl:  .  ONGLYZA 5 MG TABS tablet, 5 mg., Disp: , Rfl:  .  Probiotic Product ([employer]ON HEALTH  PO), Take by mouth., Disp: , Rfl:   Physical exam:  Vitals:   05/28/16 1332  BP: (!) 151/75  Pulse: 77  Temp: 98.1 F (36.7 C)  TempSrc: Tympanic  Weight: 202 lb (91.6 kg)   Physical Exam  Constitutional: She is oriented to person, place, and time.  Elderly frail lady ambulating with a wheelchair  HENT:  Head: Normocephalic and atraumatic.  Eyes: EOM are normal. Pupils are equal, round, and reactive to light.  Neck: Normal range of motion.  Cardiovascular: Normal rate, regular rhythm and normal heart sounds.   Pulmonary/Chest: Effort normal and breath sounds normal.  Abdominal: Soft. Bowel sounds are normal.  Musculoskeletal:  Sleeve in place for left arm lymphedema  Neurological: She is alert and oriented to person, place, and time.  Skin: Skin is warm and dry.    Breast exam is performed in seated  position. Patient is status post left mastectomy with reconstruction. The implant edges are intact and there is no evidence of any chest wall recurrence. No evidence of bilateral axillary adenopathy. No evidence of right breast masses  CMP Latest Ref Rng & Units 05/29/2015  Glucose 65 - 99 mg/dL 219(H)  BUN 6 - 20 mg/dL 21(H)  Creatinine 0.44 - 1.00 mg/dL 0.82  Sodium 135 - 145 mmol/L 133(L)  Potassium 3.5 - 5.1 mmol/L 4.2  Chloride 101 - 111 mmol/L 103  CO2 22 - 32 mmol/L 24  Calcium 8.9 - 10.3 mg/dL 8.8(L)  Total Protein 6.5 - 8.1 g/dL 7.1  Total Bilirubin 0.3 - 1.2 mg/dL 0.6  Alkaline Phos 38 - 126 U/L 70  AST 15 - 41 U/L 21  ALT 14 - 54 U/L 15   CBC Latest Ref Rng & Units 05/29/2015  WBC 3.6 - 11.0 K/uL 6.0  Hemoglobin 12.0 - 16.0 g/dL 12.7  Hematocrit 35.0 - 47.0 % 38.2  Platelets 150 - 440 K/uL 188    No images are attached to the encounter.  Mm Screening Breast Tomo Uni R  Result Date: 05/11/2016 CLINICAL DATA:  Screening. EXAM: 2D DIGITAL SCREENING UNILATERAL RIGHT MAMMOGRAM WITH CAD AND ADJUNCT TOMO COMPARISON:  Previous exam(s). ACR Breast Density Category c:  The breast tissue is heterogeneously dense, which may obscure small masses. FINDINGS: The patient has had a left mastectomy. There are no findings suspicious for malignancy. Images were processed with CAD. IMPRESSION: No mammographic evidence of malignancy. A result letter of this screening mammogram will be mailed directly to the patient. RECOMMENDATION: Screening mammogram in one year.  (Code:SM-R-15M) BI-RADS CATEGORY  1: Negative. Electronically Signed   By: Pamelia Hoit M.D.   On: 05/11/2016 13:17     Assessment and plan- Patient is a 83 y.o. female with a h/o ER positive stage III breast cancer in 2002 who has completed hormone treatment in 2007. Currently on observation.  Clinically patient is doing well and there is no evidence of recurrence on today's exam. Mammogram from 05/11/2016 did not show any evidence of malignancy in the right breast. I will see her back in one year and get a mammogram prior   Visit Diagnosis 1. Carcinoma of left breast in female, estrogen receptor positive, unspecified site of breast ([place])      Dr. Randa Evens, MD, MPH [facility] at [facility] Pager- [phone] 05/29/2016 8:17 AM

## 2017-02-04 ENCOUNTER — Other Ambulatory Visit: Payer: Self-pay

## 2017-02-04 ENCOUNTER — Inpatient Hospital Stay
Admission: EM | Admit: 2017-02-04 | Discharge: 2017-02-06 | DRG: 247 | Disposition: A | Discharge status: Home / Self Care | Payer: Medicare Other | Attending: Internal Medicine | Admitting: Internal Medicine

## 2017-02-04 ENCOUNTER — Encounter: Payer: Self-pay | Admitting: Emergency Medicine

## 2017-02-04 ENCOUNTER — Emergency Department: Payer: Medicare Other

## 2017-02-04 DIAGNOSIS — R269 Unspecified abnormalities of gait and mobility: Secondary | ICD-10-CM | POA: Diagnosis present

## 2017-02-04 DIAGNOSIS — F419 Anxiety disorder, unspecified: Secondary | ICD-10-CM | POA: Diagnosis present

## 2017-02-04 DIAGNOSIS — I214 Non-ST elevation (NSTEMI) myocardial infarction: Secondary | ICD-10-CM | POA: Diagnosis present

## 2017-02-04 DIAGNOSIS — Z9221 Personal history of antineoplastic chemotherapy: Secondary | ICD-10-CM | POA: Diagnosis not present

## 2017-02-04 DIAGNOSIS — Z7984 Long term (current) use of oral hypoglycemic drugs: Secondary | ICD-10-CM

## 2017-02-04 DIAGNOSIS — Z9012 Acquired absence of left breast and nipple: Secondary | ICD-10-CM | POA: Diagnosis not present

## 2017-02-04 DIAGNOSIS — K219 Gastro-esophageal reflux disease without esophagitis: Secondary | ICD-10-CM | POA: Diagnosis present

## 2017-02-04 DIAGNOSIS — G4730 Sleep apnea, unspecified: Secondary | ICD-10-CM | POA: Diagnosis present

## 2017-02-04 DIAGNOSIS — Z8249 Family history of ischemic heart disease and other diseases of the circulatory system: Secondary | ICD-10-CM

## 2017-02-04 DIAGNOSIS — I890 Lymphedema, not elsewhere classified: Secondary | ICD-10-CM | POA: Diagnosis present

## 2017-02-04 DIAGNOSIS — E669 Obesity, unspecified: Secondary | ICD-10-CM | POA: Diagnosis present

## 2017-02-04 DIAGNOSIS — Z803 Family history of malignant neoplasm of breast: Secondary | ICD-10-CM

## 2017-02-04 DIAGNOSIS — N319 Neuromuscular dysfunction of bladder, unspecified: Secondary | ICD-10-CM | POA: Diagnosis present

## 2017-02-04 DIAGNOSIS — I119 Hypertensive heart disease without heart failure: Secondary | ICD-10-CM | POA: Diagnosis present

## 2017-02-04 DIAGNOSIS — E119 Type 2 diabetes mellitus without complications: Secondary | ICD-10-CM | POA: Diagnosis present

## 2017-02-04 DIAGNOSIS — Z853 Personal history of malignant neoplasm of breast: Secondary | ICD-10-CM | POA: Diagnosis not present

## 2017-02-04 DIAGNOSIS — Z6838 Body mass index (BMI) 38.0-38.9, adult: Secondary | ICD-10-CM

## 2017-02-04 DIAGNOSIS — I517 Cardiomegaly: Secondary | ICD-10-CM | POA: Diagnosis present

## 2017-02-04 DIAGNOSIS — Z7982 Long term (current) use of aspirin: Secondary | ICD-10-CM

## 2017-02-04 DIAGNOSIS — Z923 Personal history of irradiation: Secondary | ICD-10-CM | POA: Diagnosis not present

## 2017-02-04 LAB — BASIC METABOLIC PANEL WITH GFR
Anion gap: 12 (ref 5–15)
BUN: 15 mg/dL (ref 6–20)
CO2: 21 mmol/L — ABNORMAL LOW (ref 22–32)
Calcium: 9.1 mg/dL (ref 8.9–10.3)
Chloride: 101 mmol/L (ref 101–111)
Creatinine, Ser: 0.99 mg/dL (ref 0.44–1.00)
GFR calc Af Amer: 60 mL/min — ABNORMAL LOW (ref >60)
GFR calc non Af Amer: 52 mL/min — ABNORMAL LOW (ref >60)
Glucose, Bld: 204 mg/dL — ABNORMAL HIGH (ref 65–99)
Potassium: 4.2 mmol/L (ref 3.5–5.1)
Sodium: 134 mmol/L — ABNORMAL LOW (ref 135–145)

## 2017-02-04 LAB — CBC
HCT: 41.4 % (ref 35.0–47.0)
Hemoglobin: 13.6 g/dL (ref 12.0–16.0)
MCH: 31.4 pg (ref 26.0–34.0)
MCHC: 32.9 g/dL (ref 32.0–36.0)
MCV: 95.3 fL (ref 80.0–100.0)
Platelets: 203 K/uL (ref 150–440)
RBC: 4.35 MIL/uL (ref 3.80–5.20)
RDW: 13.2 % (ref 11.5–14.5)
WBC: 9.1 K/uL (ref 3.6–11.0)

## 2017-02-04 LAB — GLUCOSE, CAPILLARY
GLUCOSE-CAPILLARY: 197 mg/dL — AB (ref 65–99)
Glucose-Capillary: 136 mg/dL — ABNORMAL HIGH (ref 65–99)
Glucose-Capillary: 145 mg/dL — ABNORMAL HIGH (ref 65–99)

## 2017-02-04 LAB — HEMOGLOBIN A1C
Hgb A1c MFr Bld: 8 % — ABNORMAL HIGH (ref 4.8–5.6)
MEAN PLASMA GLUCOSE: 182.9 mg/dL

## 2017-02-04 LAB — MAGNESIUM: Magnesium: 1.9 mg/dL (ref 1.7–2.4)

## 2017-02-04 LAB — TROPONIN I
TROPONIN I: 1.68 ng/mL — AB (ref <0.03)
Troponin I: 0.79 ng/mL (ref <0.03)
Troponin I: 1.3 ng/mL (ref <0.03)

## 2017-02-04 LAB — HEPARIN LEVEL (UNFRACTIONATED): Heparin Unfractionated: 0.29 IU/mL — ABNORMAL LOW (ref 0.30–0.70)

## 2017-02-04 LAB — APTT: aPTT: 26 s (ref 24–36)

## 2017-02-04 LAB — PROTIME-INR
INR: 0.92
PROTHROMBIN TIME: 12.3 s (ref 11.4–15.2)

## 2017-02-04 MED ORDER — SODIUM CHLORIDE 0.9 % WEIGHT BASED INFUSION
3.0000 mL/kg/h | INTRAVENOUS | Status: DC
  Administered 2017-02-05: 3 mL/kg/h via INTRAVENOUS

## 2017-02-04 MED ORDER — PREGABALIN 50 MG PO CAPS
50.0000 mg | ORAL_CAPSULE | Freq: Every evening | ORAL | Status: DC
  Filled 2017-02-04: qty 1

## 2017-02-04 MED ORDER — SODIUM CHLORIDE 0.9 % IV SOLN: 250.0000 mL | INTRAVENOUS | Status: DC | PRN

## 2017-02-04 MED ORDER — ONDANSETRON HCL 4 MG/2ML IJ SOLN: 4.0000 mg | Freq: Four times a day (QID) | INTRAMUSCULAR | Status: DC | PRN

## 2017-02-04 MED ORDER — ASPIRIN EC 81 MG PO TBEC: 81.0000 mg | DELAYED_RELEASE_TABLET | ORAL | Status: DC

## 2017-02-04 MED ORDER — INSULIN ASPART 100 UNIT/ML ~~LOC~~ SOLN
0.0000 [IU] | Freq: Every day | SUBCUTANEOUS | Status: DC
Start: 2017-02-04 — End: 2017-02-06

## 2017-02-04 MED ORDER — RISAQUAD PO CAPS
1.0000 | ORAL_CAPSULE | ORAL | Status: DC
  Administered 2017-02-06: 1 via ORAL
  Filled 2017-02-04: qty 1

## 2017-02-04 MED ORDER — ACETAMINOPHEN 325 MG PO TABS: 650.0000 mg | ORAL_TABLET | Freq: Four times a day (QID) | ORAL | Status: DC | PRN

## 2017-02-04 MED ORDER — SENNOSIDES-DOCUSATE SODIUM 8.6-50 MG PO TABS: 1.0000 | ORAL_TABLET | Freq: Every evening | ORAL | Status: DC | PRN

## 2017-02-04 MED ORDER — HEPARIN BOLUS VIA INFUSION
3900.0000 [IU] | Freq: Once | INTRAVENOUS | Status: AC
  Administered 2017-02-04: 3900 [IU] via INTRAVENOUS
  Filled 2017-02-04: qty 3900

## 2017-02-04 MED ORDER — PAROXETINE HCL 10 MG PO TABS
10.0000 mg | ORAL_TABLET | Freq: Every day | ORAL | Status: DC
  Administered 2017-02-06: 10 mg via ORAL
  Filled 2017-02-04 (×3): qty 1

## 2017-02-04 MED ORDER — NITROGLYCERIN 0.4 MG SL SUBL
SUBLINGUAL_TABLET | SUBLINGUAL | Status: AC
  Administered 2017-02-04: 0.4 mg via SUBLINGUAL
  Filled 2017-02-04: qty 3

## 2017-02-04 MED ORDER — ALBUTEROL SULFATE (2.5 MG/3ML) 0.083% IN NEBU: 2.5000 mg | INHALATION_SOLUTION | RESPIRATORY_TRACT | Status: DC | PRN

## 2017-02-04 MED ORDER — PREGABALIN 50 MG PO CAPS
50.0000 mg | ORAL_CAPSULE | Freq: Every evening | ORAL | Status: DC
  Administered 2017-02-04 – 2017-02-05 (×2): 50 mg via ORAL
  Filled 2017-02-04 (×2): qty 1

## 2017-02-04 MED ORDER — SODIUM CHLORIDE 0.9 % WEIGHT BASED INFUSION: 1.0000 mL/kg/h | INTRAVENOUS | Status: DC

## 2017-02-04 MED ORDER — HEPARIN BOLUS VIA INFUSION
1000.0000 [IU] | Freq: Once | INTRAVENOUS | Status: AC
  Administered 2017-02-04: 1000 [IU] via INTRAVENOUS
  Filled 2017-02-04: qty 1000

## 2017-02-04 MED ORDER — SODIUM CHLORIDE 0.9% FLUSH
3.0000 mL | Freq: Two times a day (BID) | INTRAVENOUS | Status: DC
  Administered 2017-02-04: 3 mL via INTRAVENOUS

## 2017-02-04 MED ORDER — HEPARIN (PORCINE) IN NACL 100-0.45 UNIT/ML-% IJ SOLN
900.0000 [IU]/h | INTRAMUSCULAR | Status: DC
  Administered 2017-02-04: 800 [IU]/h via INTRAVENOUS
  Filled 2017-02-04: qty 250

## 2017-02-04 MED ORDER — LOSARTAN POTASSIUM 50 MG PO TABS
100.0000 mg | ORAL_TABLET | Freq: Every day | ORAL | Status: DC
  Administered 2017-02-06: 100 mg via ORAL
  Filled 2017-02-04: qty 2

## 2017-02-04 MED ORDER — ONDANSETRON HCL 4 MG PO TABS: 4.0000 mg | ORAL_TABLET | Freq: Four times a day (QID) | ORAL | Status: DC | PRN

## 2017-02-04 MED ORDER — NITROGLYCERIN 0.4 MG SL SUBL
0.4000 mg | SUBLINGUAL_TABLET | SUBLINGUAL | Status: DC | PRN
  Administered 2017-02-04: 0.4 mg via SUBLINGUAL

## 2017-02-04 MED ORDER — ASPIRIN 81 MG PO CHEW
243.0000 mg | CHEWABLE_TABLET | Freq: Once | ORAL | Status: AC
  Administered 2017-02-04: 243 mg via ORAL

## 2017-02-04 MED ORDER — VITAMIN D (ERGOCALCIFEROL) 1.25 MG (50000 UNIT) PO CAPS: 50000.0000 [IU] | ORAL_CAPSULE | ORAL | Status: DC

## 2017-02-04 MED ORDER — ASPIRIN 81 MG PO CHEW: 81.0000 mg | CHEWABLE_TABLET | ORAL | Status: DC

## 2017-02-04 MED ORDER — PANTOPRAZOLE SODIUM 40 MG PO TBEC
40.0000 mg | DELAYED_RELEASE_TABLET | Freq: Every day | ORAL | Status: DC
  Administered 2017-02-04 – 2017-02-06 (×2): 40 mg via ORAL
  Filled 2017-02-04 (×2): qty 1

## 2017-02-04 MED ORDER — HYDRALAZINE HCL 20 MG/ML IJ SOLN: 10.0000 mg | Freq: Four times a day (QID) | INTRAMUSCULAR | Status: DC | PRN

## 2017-02-04 MED ORDER — INSULIN ASPART 100 UNIT/ML ~~LOC~~ SOLN
0.0000 [IU] | Freq: Three times a day (TID) | SUBCUTANEOUS | Status: DC
  Administered 2017-02-04 – 2017-02-05 (×2): 1 [IU] via SUBCUTANEOUS
  Administered 2017-02-06: 2 [IU] via SUBCUTANEOUS
  Administered 2017-02-06: 5 [IU] via SUBCUTANEOUS
  Filled 2017-02-04 (×4): qty 1

## 2017-02-04 MED ORDER — ASPIRIN 81 MG PO CHEW
CHEWABLE_TABLET | ORAL | Status: AC
  Filled 2017-02-04: qty 3

## 2017-02-04 MED ORDER — PRAVASTATIN SODIUM 20 MG PO TABS: 10.0000 mg | ORAL_TABLET | ORAL | Status: DC

## 2017-02-04 MED ORDER — SODIUM CHLORIDE 0.9% FLUSH: 3.0000 mL | INTRAVENOUS | Status: DC | PRN

## 2017-02-04 MED ORDER — BISACODYL 5 MG PO TBEC
5.0000 mg | DELAYED_RELEASE_TABLET | Freq: Every day | ORAL | Status: DC | PRN
Start: 2017-02-04 — End: 2017-02-06

## 2017-02-04 MED ORDER — LORATADINE 10 MG PO TABS
10.0000 mg | ORAL_TABLET | Freq: Every day | ORAL | Status: DC
  Administered 2017-02-06: 10 mg via ORAL
  Filled 2017-02-04 (×2): qty 1

## 2017-02-04 MED ORDER — OXYBUTYNIN CHLORIDE ER 5 MG PO TB24
5.0000 mg | ORAL_TABLET | Freq: Every day | ORAL | Status: DC
  Administered 2017-02-04 – 2017-02-05 (×2): 5 mg via ORAL
  Filled 2017-02-04 (×4): qty 1

## 2017-02-04 MED ORDER — ACETAMINOPHEN 650 MG RE SUPP: 650.0000 mg | Freq: Four times a day (QID) | RECTAL | Status: DC | PRN

## 2017-02-04 MED ORDER — METOPROLOL TARTRATE 25 MG PO TABS
25.0000 mg | ORAL_TABLET | Freq: Two times a day (BID) | ORAL | Status: DC
  Administered 2017-02-04 – 2017-02-06 (×4): 25 mg via ORAL
  Filled 2017-02-04 (×4): qty 1

## 2017-02-04 MED ORDER — HEPARIN BOLUS VIA INFUSION
1000.0000 [IU] | Freq: Once | INTRAVENOUS | Status: DC
  Filled 2017-02-04: qty 1000

## 2017-02-04 NOTE — Consult Note (Signed)
[facility] Cardiology  CARDIOLOGY CONSULT NOTE  Patient ID: Janet Elliott MRN: [id number] DOB/AGE: 08-14-1932 84 y.o.  Admit date: 02/04/2017 Referring Physician Bridgett Larsson Primary Physician [facility] Primary Cardiologist None per pt Reason for Consultation Chest pain, elevated troponin  HPI: 84 year old female referred for evaluation of chest pain and elevated troponin.  Patient has a history of obesity, type 2 diabetes, essential hypertension, breast cancer, and lymphedema of the left arm.  The patient reports a 4-day history of intermittent chest pain, described as a nagging sensation that would come and go without intervention, occurring with and without exertion.  The patient reports that she has had similar chest pain in the past and attributed it to increased lymphedema of the left arm.  Last night, however, the patient states that the chest pain did not go away and progressively worsened.  She did not have associated shortness of breath, diaphoresis, or nausea.  She did have some discomfort across her upper back.  The patient is very inactive, uses a walker for gait instability.  The patient's sister brought her to [facility] ER for further evaluation this morning.  Initial troponin was elevated to 0.79.  ECG revealed sinus rhythm without acute ST or T wave abnormalities.  Chest x-ray revealed cardiomegaly with normal pulmonary vascularity without effusion.  The patient received aspirin and nitroglycerin with improvement in her pain, and was started on heparin drip.  Currently, the patient reports that she has vague residual chest tightness with upper back discomfort but states it is much improved from what it was.  She is sitting upright in bed eating lunch.  Review of systems complete and found to be negative unless listed above     Past Medical History:  Diagnosis Date  . Anxiety   . Arthritis    osteoarthritis  . Breast cancer ([place]) 2002   Left  chemo/radistion  . Carcinoma of left breast ([place])   .  Lymphedema   . Sleep apnea     Past Surgical History:  Procedure Laterality Date  . BREAST BIOPSY Right 1980  . EYE SURGERY    . MASTECTOMY Left 2002    Medications Prior to Admission  Medication Sig Dispense Refill Last Dose  . aspirin EC 81 MG tablet Take 81 mg by mouth every other day.    02/04/2017 at AM  . cyanocobalamin (V-R VITAMIN B-12) 500 MCG tablet Take 500 mcg by mouth.   02/04/2017 at AM  . diclofenac sodium (VOLTAREN) 1 % GEL Apply 2 g topically 4 (four) times daily. Reported on 05/29/2015   PRN at PRN  . esomeprazole (NEXIUM) 20 MG capsule Take 20 mg by mouth daily.    02/04/2017 at AM  . fexofenadine (ALLEGRA) 180 MG tablet Take 180 mg by mouth daily.   02/04/2017 at AM  . glimepiride (AMARYL) 4 MG tablet Take 4 mg by mouth daily with breakfast.    02/04/2017 at AM  . glucose blood (ONE TOUCH ULTRA TEST) test strip CHECK BLOOD SUGAR TWICE A DAY   UTD at UTD  . Lancets Misc. (UNISTIK 2 NORMAL) MISC USE AS DIRECTED   UTD at UTD  . losartan (COZAAR) 100 MG tablet Take 100 mg by mouth daily. Reported on 05/29/2015   02/04/2017 at AM  . Methylcellulose, Laxative, (CITRUCEL) 500 MG TABS Take 1 tablet by mouth daily at 6 (six) AM.   02/04/2017 at AM  . ONGLYZA 5 MG TABS tablet Take 5 mg by mouth every morning.    02/04/2017 at  AM  . oxybutynin (DITROPAN-XL) 5 MG 24 hr tablet Take 5 mg by mouth at bedtime.   02/03/2017 at PM  . PARoxetine (PAXIL) 20 MG tablet Take 10 mg by mouth daily.   02/04/2017 at AM  . pravastatin (PRAVACHOL) 10 MG tablet Take 10 mg by mouth 3 (three) times a week.   02/03/2017 at PM  . pregabalin (LYRICA) 50 MG capsule Take 50 mg by mouth every evening.    02/03/2017 at PM  . Probiotic Product ([place]) Take 1 capsule by mouth every morning.    02/04/2017 at AM  . Vitamin D, Ergocalciferol, (DRISDOL) 50000 UNITS CAPS capsule Take 50,000 Units by mouth every 7 (seven) days.    01/31/2017 at AM   Social History   Socioeconomic History  . Marital status:  Single    Spouse name: Not on file  . Number of children: Not on file  . Years of education: Not on file  . Highest education level: Not on file  Social Needs  . Financial resource strain: Not on file  . Food insecurity - worry: Not on file  . Food insecurity - inability: Not on file  . Transportation needs - medical: Not on file  . Transportation needs - non-medical: Not on file  Occupational History  . Not on file  Tobacco Use  . Smoking status: Never Smoker  . Smokeless tobacco: Never Used  Substance and Sexual Activity  . Alcohol use: No  . Drug use: Not on file  . Sexual activity: Not on file  Other Topics Concern  . Not on file  Social History Narrative  . Not on file    Family History  Problem Relation Age of Onset  . Breast cancer Maternal Aunt   . Breast cancer Cousin   . Breast cancer Cousin       Review of systems complete and found to be negative unless listed above      PHYSICAL EXAM  General: Well developed, well nourished, in no acute distress, sitting upright in bed eating lunch HEENT:  Normocephalic and atramatic Neck:  No JVD.  Lungs: Normal effort of breathing on room air, no wheezing Heart: HRRR . Normal S1 and S2 without gallops or murmurs.  Abdomen: Bowel sounds are positive, abdomen soft  Msk: Strength and tone appears normal for age, sitting upright in bed, gait not assessed. Extremities: Left arm lymphedema, compression sleeve in place Neuro: Alert and oriented X 3. Psych:  Good affect, responds appropriately  Labs:   Lab Results  Component Value Date   WBC 9.1 02/04/2017   HGB 13.6 02/04/2017   HCT 41.4 02/04/2017   MCV 95.3 02/04/2017   PLT 203 02/04/2017    Recent Labs  Lab 02/04/17 1145  NA 134*  K 4.2  CL 101  CO2 21*  BUN 15  CREATININE 0.99  CALCIUM 9.1  GLUCOSE 204*   Lab Results  Component Value Date   TROPONINI 0.79 ([place]) 02/04/2017   No results found for: CHOL No results found for: HDL No results found  for: LDLCALC No results found for: TRIG No results found for: CHOLHDL No results found for: LDLDIRECT    Radiology: Dg Chest 2 View  Result Date: 02/04/2017 CLINICAL DATA:  Chest pain. EXAM: CHEST  2 VIEW COMPARISON:  10/11/2006. FINDINGS: Mediastinum hilar structures normal. Lungs are clear. No pleural effusion or pneumothorax. Cardiomegaly with normal pulmonary vascularity. IMPRESSION: No active cardiopulmonary disease. Electronically Signed   By: Marcello Moores  Register   On: 02/04/2017 11:12    EKG: Sinus rhythm, 76 bpm  ASSESSMENT AND PLAN:  1. Atypical, nonexertional chest pain in an 84 year old female with multiple cardiovascular risk factors including obesity, hypertension, and type 2 diabetes.  Initial troponin is 0.79. ECG without acute ST or T wave abnormalities.  Currently on heparin drip. 2. Hypertension 3. Type II diabetes  Recommendations: 1. Agree with overall therapy 2. Continue heparin drip 3. Pending next troponin, will proceed with cardiac catheterization or nuclear stress test in the morning. 4. 2D echocardiogram 5. NPO at midnight regardless  Signed: Clabe Seal, PA-C 02/04/2017, 3:21 PM

## 2017-02-04 NOTE — ED Triage Notes (Signed)
Patient presents to ED via POV from home with c/o CP "on and off for 4 days". Patient describes the pain as a pressure. Patient also reports shoulder blade pain. Denies N/V, dizziness, SOB.

## 2017-02-04 NOTE — Progress Notes (Signed)
Dr. Saralyn Pilar, MD on call for [facility] cardiology notified via text page re critical troponin lab value.

## 2017-02-04 NOTE — Progress Notes (Signed)
ANTICOAGULATION CONSULT NOTE - Initial Consult  Pharmacy Consult for heparin Indication: chest pain/ACS  Allergies  Allergen Reactions  . Codeine     Other reaction(s): Unknown  . Statins Other (See Comments)    Patient Measurements: Height: 4\' 11"  (149.9 cm) Weight: 200 lb (90.7 kg) IBW/kg (Calculated) : 43.2 Heparin Dosing Weight: 65 kg  Vital Signs: Temp: 97.8 F (36.6 C) (01/31 1053) Temp Source: Oral (01/31 1053) BP: 153/80 (01/31 1055) Pulse Rate: 87 (01/31 1053)  Labs: Recent Labs    02/04/17 1052  HGB 13.6  HCT 41.4  PLT 203  TROPONINI 0.79*    CrCl cannot be calculated (Patient's most recent lab result is older than the maximum 21 days allowed.).   Medical History: Past Medical History:  Diagnosis Date  . Anxiety   . Arthritis    osteoarthritis  . Breast cancer ([place]) 2002   Left  chemo/radistion  . Carcinoma of left breast ([place])   . Lymphedema   . Sleep apnea     Medications:  Infusions:  . heparin      Assessment: 84 yof to ED with CP on and off x 4 days. Initial troponin 0.79. ECG NSR, septal infarct of undetermined age, abnormal ECG but no change from previous (2013). Pharmacy consulted to dose heparin for ACS. No PTA OAC listed but med rec not completed. Will f/u.    Goal of Therapy:  Heparin level 0.3-0.7 units/ml Monitor platelets by anticoagulation protocol: Yes   Plan:  Give 3900 units bolus x 1 Start heparin infusion at 800 units/hr Check anti-Xa level in 8 hours and daily while on heparin Continue to monitor H&H and platelets  Laural Benes, Pharm.D., BCPS Clinical Pharmacist 02/04/2017,11:54 AM

## 2017-02-04 NOTE — H&P (Signed)
[facility] NAME: Janet Elliott    MR#:  [id number]  DATE OF BIRTH:  27-Nov-1932  DATE OF ADMISSION:  02/04/2017  PRIMARY CARE PHYSICIAN: Kirk Ruths, MD   REQUESTING/REFERRING PHYSICIAN: Lavonia Drafts, MD  CHIEF COMPLAINT:   Chief Complaint  Patient presents with  . Chest Pain   Chest pain on and off for several days. HISTORY OF PRESENT ILLNESS:  Janet Elliott  is a 84 y.o. female with a known history of as below's.  The patient presented to ED with chest pain on and off for the past few days, which she is in the central area of the chest, tight, intermittent, 5 out of 10 with a radiation to the left shoulder.  The patient denies any diaphoresis, nausea, vomiting or diarrhea.  Troponin level is elevated at 0.79.  Heparin drip is started.  PAST MEDICAL HISTORY:   Past Medical History:  Diagnosis Date  . Anxiety   . Arthritis    osteoarthritis  . Breast cancer ([place]) 2002   Left  chemo/radistion  . Carcinoma of left breast ([place])   . Lymphedema   . Sleep apnea     PAST SURGICAL HISTORY:   Past Surgical History:  Procedure Laterality Date  . BREAST BIOPSY Right 1980  . EYE SURGERY    . MASTECTOMY Left 2002    SOCIAL HISTORY:   Social History   Tobacco Use  . Smoking status: Never Smoker  . Smokeless tobacco: Never Used  Substance Use Topics  . Alcohol use: No    FAMILY HISTORY:   Family History  Problem Relation Age of Onset  . Breast cancer Maternal Aunt   . Breast cancer Cousin   . Breast cancer Cousin     DRUG ALLERGIES:   Allergies  Allergen Reactions  . Codeine     Other reaction(s): Unknown  . Statins Other (See Comments)    REVIEW OF SYSTEMS:   Review of Systems  Constitutional: Negative for chills, fever and malaise/fatigue.  HENT: Negative for sore throat.   Eyes: Negative for blurred vision and double vision.  Respiratory: Negative for cough, hemoptysis, shortness of breath,  wheezing and stridor.   Cardiovascular: Positive for chest pain. Negative for palpitations, orthopnea and leg swelling.  Gastrointestinal: Negative for abdominal pain, blood in stool, diarrhea, melena, nausea and vomiting.  Genitourinary: Negative for dysuria, flank pain and hematuria.  Musculoskeletal: Negative for back pain and joint pain.       Left arm chronic lymphedema.  Skin: Negative for rash.  Neurological: Negative for dizziness, sensory change, focal weakness, seizures, loss of consciousness, weakness and headaches.  Endo/Heme/Allergies: Negative for polydipsia.  Psychiatric/Behavioral: Negative for depression. The patient is not nervous/anxious.     MEDICATIONS AT HOME:   Prior to Admission medications   Medication Sig Start Date End Date Taking? Authorizing Provider  aspirin EC 81 MG tablet Take 81 mg by mouth every other day.    Yes [provider]  cyanocobalamin (V-R VITAMIN B-12) 500 MCG tablet Take 500 mcg by mouth.   Yes [provider]  diclofenac sodium (VOLTAREN) 1 % GEL Apply 2 g topically 4 (four) times daily. Reported on 05/29/2015   Yes [provider]  esomeprazole (NEXIUM) 20 MG capsule Take 20 mg by mouth daily.    Yes [provider]  fexofenadine (ALLEGRA) 180 MG tablet Take 180 mg by mouth daily.   Yes [provider]  glimepiride (  AMARYL) 4 MG tablet Take 4 mg by mouth daily with breakfast.    Yes [provider]  glucose blood (ONE TOUCH ULTRA TEST) test strip CHECK BLOOD SUGAR TWICE A DAY 04/22/16  Yes [provider]  Lancets Misc. (UNISTIK 2 NORMAL) MISC USE AS DIRECTED 01/17/16  Yes [provider]  losartan (COZAAR) 100 MG tablet Take 100 mg by mouth daily. Reported on 05/29/2015   Yes [provider]  Methylcellulose, Laxative, (CITRUCEL) 500 MG TABS Take 1 tablet by mouth daily at 6 (six) AM.   Yes [provider]  ONGLYZA 5 MG TABS tablet Take 5 mg by mouth every  morning.  05/25/16  Yes [provider]  oxybutynin (DITROPAN-XL) 5 MG 24 hr tablet Take 5 mg by mouth at bedtime.   Yes [provider]  PARoxetine (PAXIL) 20 MG tablet Take 10 mg by mouth daily.   Yes [provider]  pravastatin (PRAVACHOL) 10 MG tablet Take 10 mg by mouth 3 (three) times a week.   Yes [provider]  pregabalin (LYRICA) 50 MG capsule Take 50 mg by mouth every evening.    Yes [provider]  Probiotic Product ([place]) Take 1 capsule by mouth every morning.    Yes [provider]  Vitamin D, Ergocalciferol, (DRISDOL) 50000 UNITS CAPS capsule Take 50,000 Units by mouth every 7 (seven) days.    Yes [provider]      VITAL SIGNS:  Blood pressure 128/70, pulse 73, temperature 97.8 F (36.6 C), temperature source Oral, resp. rate 16, height 4\' 11"  (1.499 m), weight 200 lb (90.7 kg), SpO2 95 %.  PHYSICAL EXAMINATION:  Physical Exam  GENERAL:  84 y.o.-year-old patient lying in the bed with no acute distress.  Obesity. EYES: Pupils equal, round, reactive to light and accommodation. No scleral icterus. Extraocular muscles intact.  HEENT: Head atraumatic, normocephalic. Oropharynx and nasopharynx clear.  NECK:  Supple, no jugular venous distention. No thyroid enlargement, no tenderness.  LUNGS: Normal breath sounds bilaterally, no wheezing, rales,rhonchi or crepitation. No use of accessory muscles of respiration.  CARDIOVASCULAR: S1, S2 normal. No murmurs, rubs, or gallops.  ABDOMEN: Soft, nontender, nondistended. Bowel sounds present. No organomegaly or mass.  EXTREMITIES: No pedal edema, cyanosis, or clubbing. Left arm chronic lymphedema. NEUROLOGIC: Cranial nerves II through XII are intact. Muscle strength 5/5 in all extremities. Sensation intact. Gait not checked.  PSYCHIATRIC: The patient is alert and oriented x 3.  SKIN: No obvious rash, lesion, or ulcer.   LABORATORY PANEL:    CBC Recent Labs  Lab 02/04/17 1052  WBC 9.1  HGB 13.6  HCT 41.4  PLT 203   ------------------------------------------------------------------------------------------------------------------  Chemistries  Recent Labs  Lab 02/04/17 1145  NA 134*  K 4.2  CL 101  CO2 21*  GLUCOSE 204*  BUN 15  CREATININE 0.99  CALCIUM 9.1   ------------------------------------------------------------------------------------------------------------------  Cardiac Enzymes Recent Labs  Lab 02/04/17 1052  TROPONINI 0.79*   ------------------------------------------------------------------------------------------------------------------  RADIOLOGY:  Dg Chest 2 View  Result Date: 02/04/2017 CLINICAL DATA:  Chest pain. EXAM: CHEST  2 VIEW COMPARISON:  10/11/2006. FINDINGS: Mediastinum hilar structures normal. Lungs are clear. No pleural effusion or pneumothorax. Cardiomegaly with normal pulmonary vascularity. IMPRESSION: No active cardiopulmonary disease. Electronically Signed   By: Marcello Moores  Register   On: 02/04/2017 11:12      IMPRESSION AND PLAN:   Non-STEMI The patient will be admitted to telemetry floor. Start heparin drip, follow-up troponin level and cardiology  consult. Continue aspirin, Pravachol and nitroglycerin as needed.  Follow-up lipid panel. Start Lopressor and continue losartan.  Hypertension, continue home hypertension medication. Diabetes.  Hold p.o. diabetes medication, start sliding scale. Obesity.  All the records are reviewed and case discussed with ED provider. Management plans discussed with the patient, her sister and they are in agreement.  CODE STATUS: Full code  TOTAL TIME TAKING CARE OF THIS PATIENT: 56 minutes.    Demetrios Loll M.D on 02/04/2017 at 1:03 PM  Between 7am to 6pm - Pager - [phone]  After 6pm go to [email or web address] - [employer] [facility]  Office  [phone]  CC: Primary care physician;  Kirk Ruths, MD   Note: This dictation was prepared with Dragon dictation along with smaller phrase technology. Any transcriptional errors that result from this process are unin

## 2017-02-04 NOTE — Plan of Care (Signed)
Pt admitted this shift from ED . A&Ox4. VSS . RA . NSR on monitor. Family at bedside. OOB with assist. Heparin gtt infusing per order. No complaints thus far. Will continue to monitor and report to oncoming RN .  Progressing Education: Knowledge of General Education information will improve 02/04/2017 1505 - Progressing by Aleen Campi, RN Health Behavior/Discharge Planning: Ability to manage health-related needs will improve 02/04/2017 1505 - Progressing by Aleen Campi, RN Clinical Measurements: Ability to maintain clinical measurements within normal limits will improve 02/04/2017 1505 - Progressing by Aleen Campi, RN Will remain free from infection 02/04/2017 1505 - Progressing by Aleen Campi, RN Diagnostic test results will improve 02/04/2017 1505 - Progressing by Aleen Campi, RN Respiratory complications will improve 02/04/2017 1505 - Progressing by Aleen Campi, RN Cardiovascular complication will be avoided 02/04/2017 1505 - Progressing by Aleen Campi, RN Activity: Risk for activity intolerance will decrease 02/04/2017 1505 - Progressing by Aleen Campi, RN Nutrition: Adequate nutrition will be maintained 02/04/2017 1505 - Progressing by Aleen Campi, RN Coping: Level of anxiety will decrease 02/04/2017 1505 - Progressing by Aleen Campi, RN Elimination: Will not experience complications related to bowel motility 02/04/2017 1505 - Progressing by Aleen Campi, RN Will not experience complications related to urinary retention 02/04/2017 1505 - Progressing by Aleen Campi, RN Pain Managment: General experience of comfort will improve 02/04/2017 1505 - Progressing by Aleen Campi, RN Safety: Ability to remain free from injury will improve 02/04/2017 1505 - Progressing by Aleen Campi, RN Skin Integrity: Risk for impaired skin integrity will decrease 02/04/2017 1505 - Progressing by Aleen Campi, RN

## 2017-02-04 NOTE — ED Provider Notes (Signed)
[facility] Emergency Department Provider Note   ____________________________________________    I have reviewed the triage vital signs and the nursing notes.   HISTORY  Chief Complaint Chest Pain     HPI Janet Elliott is a 84 y.o. female who presents with complaints of chest pain.  Patient reports over the last 4 days she has had intermittent "heaviness "in the center of her chest.  This does not seem to be related to exertion.  Seems to be worse at night.  She does have a history of diabetes and hypertension.  Both parents had coronary artery disease.  She has a history of lymphedema in the left arm but reports her left arm has been in more pain than usual.  She reports currently pain is 4 out of 10.  No shortness of breath.  No pleurisy.  No calf pain or swelling   Past Medical History:  Diagnosis Date  . Anxiety   . Arthritis    osteoarthritis  . Breast cancer ([place]) 2002   Left  chemo/radistion  . Carcinoma of left breast ([place])   . Lymphedema   . Sleep apnea     Patient Active Problem List   Diagnosis Date Noted  . NSTEMI (non-ST elevated myocardial infarction) ([place]) 02/04/2017  . Health care maintenance 07/30/2015  . Carcinoma of left breast ([place]) 05/26/2014  . Adult BMI 30+ 03/20/2014  . Morbid obesity ([place]) 03/20/2014  . Severe obesity (BMI 35.0-35.9 with comorbidity) ([place]) 03/20/2014  . Osteoarthritis of both knees 10/24/2013  . Focal lymphocytic colitis 10/11/2013  . Lymphocytic colitis 10/11/2013  . Impingement syndrome of shoulder 09/05/2013  . Impingement syndrome of left shoulder 09/05/2013  . Combined hyperlipidemia 07/08/2013  . HTN (hypertension), benign 07/08/2013  . Diabetes mellitus, type 2 ([place]) 07/08/2013  . Type 2 diabetes mellitus with stage 3 chronic kidney disease ([place]) 07/08/2013  . PA (pernicious anemia) 07/08/2013  . Osteoporosis 07/08/2013  . Tremor 07/08/2013  . Lumbar disc herniation 05/22/2013  . Lumbar  paraspinal muscle spasm 05/22/2013  . Lumbar radiculitis 05/22/2013  . Lumbar stenosis with neurogenic claudication 05/22/2013    Past Surgical History:  Procedure Laterality Date  . BREAST BIOPSY Right 1980  . EYE SURGERY    . MASTECTOMY Left 2002    Prior to Admission medications   Medication Sig Start Date End Date Taking? Authorizing Provider  aspirin EC 81 MG tablet Take 81 mg by mouth every other day.    Yes [provider]  cyanocobalamin (V-R VITAMIN B-12) 500 MCG tablet Take 500 mcg by mouth.   Yes [provider]  diclofenac sodium (VOLTAREN) 1 % GEL Apply 2 g topically 4 (four) times daily. Reported on 05/29/2015   Yes [provider]  esomeprazole (NEXIUM) 20 MG capsule Take 20 mg by mouth daily.    Yes [provider]  fexofenadine (ALLEGRA) 180 MG tablet Take 180 mg by mouth daily.   Yes [provider]  glimepiride (AMARYL) 4 MG tablet Take 4 mg by mouth daily with breakfast.    Yes [provider]  glucose blood (ONE TOUCH ULTRA TEST) test strip CHECK BLOOD SUGAR TWICE A DAY 04/22/16  Yes [provider]  Lancets Misc. (UNISTIK 2 NORMAL) MISC USE AS DIRECTED 01/17/16  Yes [provider]  losartan (COZAAR) 100 MG tablet Take 100 mg by mouth daily. Reported on 05/29/2015   Yes [provider]  Methylcellulose, Laxative, (CITRUCEL) 500 MG TABS Take 1  tablet by mouth daily at 6 (six) AM.   Yes [provider]  ONGLYZA 5 MG TABS tablet Take 5 mg by mouth every morning.  05/25/16  Yes [provider]  oxybutynin (DITROPAN-XL) 5 MG 24 hr tablet Take 5 mg by mouth at bedtime.   Yes [provider]  PARoxetine (PAXIL) 20 MG tablet Take 10 mg by mouth daily.   Yes [provider]  pravastatin (PRAVACHOL) 10 MG tablet Take 10 mg by mouth 3 (three) times a week.   Yes [provider]  pregabalin (LYRICA) 50 MG capsule Take 50 mg by mouth every evening.    Yes  [provider]  Probiotic Product ([place]) Take 1 capsule by mouth every morning.    Yes [provider]  Vitamin D, Ergocalciferol, (DRISDOL) 50000 UNITS CAPS capsule Take 50,000 Units by mouth every 7 (seven) days.    Yes [provider]     Allergies Codeine and Statins  Family History  Problem Relation Age of Onset  . Breast cancer Maternal Aunt   . Breast cancer Cousin   . Breast cancer Cousin     Social History Social History   Tobacco Use  . Smoking status: Never Smoker  . Smokeless tobacco: Never Used  Substance Use Topics  . Alcohol use: No  . Drug use: Not on file    Review of Systems  Constitutional: Intermittent dizziness Eyes: No visual changes.  ENT: No sore throat. Cardiovascular: Chest pain as above Respiratory: Denies shortness of breath.  No pleurisy Gastrointestinal: No abdominal pain.  Genitourinary: Negative for dysuria. Musculoskeletal: Left arm lymphedema, chronic Skin: Negative for rash. Neurological: Negative for headaches   ____________________________________________   PHYSICAL EXAM:  VITAL SIGNS: ED Triage Vitals  Enc Vitals Group     BP 02/04/17 1053 (!) 153/80     Pulse Rate 02/04/17 1053 87     Resp 02/04/17 1053 13     Temp 02/04/17 1053 97.8 F (36.6 C)     Temp Source 02/04/17 1053 Oral     SpO2 02/04/17 1053 96 %     Weight 02/04/17 1034 90.7 kg (200 lb)     Height 02/04/17 1034 1.499 m (4\' 11" )     Head Circumference --      Peak Flow --      Pain Score 02/04/17 1033 7     Pain Loc --      Pain Edu? --      Excl. in [place]? --     Constitutional: Alert and oriented.  Eyes: Conjunctivae are normal.   Nose: No congestion/rhinnorhea. Mouth/Throat: Mucous membranes are moist.    Cardiovascular: Normal rate, regular rhythm. Grossly normal heart sounds.  Good peripheral circulation. Respiratory: Normal respiratory effort.  No retractions. Lungs CTAB. Gastrointestinal: Soft  and nontender. No distention.  No CVA tenderness. Genitourinary: deferred Musculoskeletal: No lower extremity tenderness nor edema.  Chronic lymphedema left arm, warm and well perfused extremities Neurologic:  Normal speech and language. No gross focal neurologic deficits are appreciated.  Skin:  Skin is warm, dry and intact. No rash noted. Psychiatric: Mood and affect are normal. Speech and behavior are normal.  ____________________________________________   LABS (all labs ordered are listed, but only abnormal results are displayed)  Labs Reviewed  TROPONIN I - Abnormal; Notable for the following components:      Result Value   Troponin I 0.79 (*)    All other components within normal limits  BASIC METABOLIC PANEL - Abnormal; Notable for the following components:   Sodium 134 (*)    CO2 21 (*)    Glucose, Bld 204 (*)    GFR calc non Af Amer 52 (*)    GFR calc Af Amer 60 (*)    All other components within normal limits  CBC  APTT  PROTIME-INR  HEPARIN LEVEL (UNFRACTIONATED)   ____________________________________________  EKG  ED ECG REPORT I, Lavonia Drafts, the attending physician, personally viewed and interpreted this ECG.  Date: 02/04/2017  Rhythm: normal sinus rhythm QRS Axis: normal Intervals: normal ST/T Wave abnormalities: normal Narrative Interpretation: no evidence of acute ischemia  ____________________________________________  RADIOLOGY  Chest x-ray unremarkable ____________________________________________   PROCEDURES  Procedure(s) performed: No  Procedures   Critical Care performed:yes  CRITICAL CARE Performed by: Lavonia Drafts   Total critical care time:30 minutes  Critical care time was exclusive of separately billable procedures and treating other patients.  Critical care was necessary to treat or prevent imminent or life-threatening deterioration.  Critical care was time spent personally by me on the following activities:  development of treatment plan with patient and/or surrogate as well as nursing, discussions with consultants, evaluation of patient's response to treatment, examination of patient, obtaining history from patient or surrogate, ordering and performing treatments and interventions, ordering and review of laboratory studies, ordering and review of radiographic studies, pulse oximetry and re-evaluation of patient's condition.  ____________________________________________   INITIAL IMPRESSION / ASSESSMENT AND PLAN / ED COURSE  Pertinent labs & imaging results that were available during my care of the patient were reviewed by me and considered in my medical decision making (see chart for details).  History of diabetes and high blood pressure presents with chest heaviness.  EKG is unremarkable however troponin is significantly elevated consistent with an nSTEMI.  No radiation of pain to the back, not consistent with dissection or PE.  Patient treated with aspirin and nitroglycerin with improvement in her pain.  Will start heparin drip and admit to the hospitalist service    ____________________________________________   FINAL CLINICAL IMPRESSION(S) / ED DIAGNOSES  Final diagnoses:  NSTEMI (non-ST elevated myocardial infarction) [facility])        Note:  This document was prepared using Dragon voice recognition software and may include unintentional dictation errors.    Lavonia Drafts, MD 02/04/17 1257

## 2017-02-04 NOTE — Progress Notes (Signed)
ANTICOAGULATION CONSULT NOTE - Initial Consult  Pharmacy Consult for heparin Indication: chest pain/ACS  Allergies  Allergen Reactions  . Codeine     Other reaction(s): Unknown  . Statins Other (See Comments)    Patient Measurements: Height: 4\' 11"  (149.9 cm) Weight: 200 lb (90.7 kg) IBW/kg (Calculated) : 43.2 Heparin Dosing Weight: 65 kg  Vital Signs: Temp: 97.9 F (36.6 C) (01/31 1910) Temp Source: Oral (01/31 1910) BP: 138/71 (01/31 1910) Pulse Rate: 71 (01/31 1910)  Labs: Recent Labs    02/04/17 1052 02/04/17 1145 02/04/17 1155 02/04/17 1421 02/04/17 2022  HGB 13.6  --   --   --   --   HCT 41.4  --   --   --   --   PLT 203  --   --   --   --   APTT  --   --  26  --   --   LABPROT  --   --  12.3  --   --   INR  --   --  0.92  --   --   HEPARINUNFRC  --   --   --   --  0.29*  CREATININE  --  0.99  --   --   --   TROPONINI 0.79*  --   --  1.30* 1.68*    Estimated Creatinine Clearance: 43 mL/min (by C-G formula based on SCr of 0.99 mg/dL).   Medical History: Past Medical History:  Diagnosis Date  . Anxiety   . Arthritis    osteoarthritis  . Breast cancer ([place]) 2002   Left  chemo/radistion  . Carcinoma of left breast ([place])   . Lymphedema   . Sleep apnea     Medications:  Infusions:  . sodium chloride    . sodium chloride    . [START ON 02/05/2017] sodium chloride     Followed by  . [START ON 02/05/2017] sodium chloride    . heparin 800 Units/hr (02/04/17 1253)    Assessment: 37 yof to ED with CP on and off x 4 days. Initial troponin 0.79. ECG NSR, septal infarct of undetermined age, abnormal ECG but no change from previous (2013). Pharmacy consulted to dose heparin for ACS. No PTA OAC listed but med rec not completed. Will f/u.    Goal of Therapy:  Heparin level 0.3-0.7 units/ml Monitor platelets by anticoagulation protocol: Yes   Plan:  Give 3900 units bolus x 1 Start heparin infusion at 800 units/hr Check anti-Xa level in 8 hours and daily  while on heparin Continue to monitor H&H and platelets   1/31 :  HL @ 20:30 = 0.29 Will order Heparin 1000 units IV X 1 bolus and increase drip rate to 900 units/hr.  Will recheck HL 8 hrs after rate change.   Foster Frericks D, Pharm.D. Clinical Pharmacist 02/04/2017,9:29 PM

## 2017-02-05 ENCOUNTER — Encounter: Payer: Self-pay | Admitting: Cardiology

## 2017-02-05 ENCOUNTER — Inpatient Hospital Stay
Admit: 2017-02-05 | Discharge: 2017-02-05 | Disposition: A | Discharge status: Home / Self Care | Payer: Medicare Other | Attending: Physician Assistant | Admitting: Physician Assistant

## 2017-02-05 ENCOUNTER — Encounter
Admission: EM | Disposition: A | Discharge status: Home / Self Care | Payer: Self-pay | Source: Home / Self Care | Attending: Internal Medicine

## 2017-02-05 HISTORY — PX: CORONARY STENT INTERVENTION: CATH118234

## 2017-02-05 HISTORY — PX: CORONARY ANGIOGRAPHY: CATH118303

## 2017-02-05 LAB — ECHOCARDIOGRAM COMPLETE
HEIGHTINCHES: 59 in
Weight: 3067.2 oz

## 2017-02-05 LAB — LIPID PANEL
CHOL/HDL RATIO: 3.6 ratio
CHOLESTEROL: 165 mg/dL (ref 0–200)
HDL: 46 mg/dL (ref >40)
LDL Cholesterol: 79 mg/dL (ref 0–99)
TRIGLYCERIDES: 199 mg/dL — AB (ref <150)
VLDL: 40 mg/dL (ref 0–40)

## 2017-02-05 LAB — CBC
HCT: 36.8 % (ref 35.0–47.0)
HEMOGLOBIN: 12.4 g/dL (ref 12.0–16.0)
MCH: 32 pg (ref 26.0–34.0)
MCHC: 33.6 g/dL (ref 32.0–36.0)
MCV: 95.3 fL (ref 80.0–100.0)
PLATELETS: 193 10*3/uL (ref 150–440)
RBC: 3.86 MIL/uL (ref 3.80–5.20)
RDW: 12.9 % (ref 11.5–14.5)
WBC: 7.9 10*3/uL (ref 3.6–11.0)

## 2017-02-05 LAB — BASIC METABOLIC PANEL
ANION GAP: 10 (ref 5–15)
BUN: 20 mg/dL (ref 6–20)
CALCIUM: 9.1 mg/dL (ref 8.9–10.3)
CO2: 23 mmol/L (ref 22–32)
Chloride: 101 mmol/L (ref 101–111)
Creatinine, Ser: 0.81 mg/dL (ref 0.44–1.00)
GFR calc Af Amer: >60 mL/min (ref >60)
Glucose, Bld: 201 mg/dL — ABNORMAL HIGH (ref 65–99)
Potassium: 4.2 mmol/L (ref 3.5–5.1)
Sodium: 134 mmol/L — ABNORMAL LOW (ref 135–145)

## 2017-02-05 LAB — GLUCOSE, CAPILLARY
GLUCOSE-CAPILLARY: 158 mg/dL — AB (ref 65–99)
Glucose-Capillary: 172 mg/dL — ABNORMAL HIGH (ref 65–99)
Glucose-Capillary: 127 mg/dL — ABNORMAL HIGH (ref 65–99)
Glucose-Capillary: 169 mg/dL — ABNORMAL HIGH (ref 65–99)

## 2017-02-05 LAB — POCT ACTIVATED CLOTTING TIME: ACTIVATED CLOTTING TIME: 340 s

## 2017-02-05 LAB — HEPARIN LEVEL (UNFRACTIONATED)
Heparin Unfractionated: 0.44 IU/mL (ref 0.30–0.70)
Heparin Unfractionated: <0.1 IU/mL — ABNORMAL LOW (ref 0.30–0.70)

## 2017-02-05 SURGERY — CORONARY ANGIOGRAPHY (CATH LAB)
Anesthesia: Moderate Sedation

## 2017-02-05 MED ORDER — FENTANYL CITRATE (PF) 100 MCG/2ML IJ SOLN
INTRAMUSCULAR | Status: AC
  Filled 2017-02-05: qty 2

## 2017-02-05 MED ORDER — SODIUM CHLORIDE 0.9% FLUSH
3.0000 mL | Freq: Two times a day (BID) | INTRAVENOUS | Status: DC
  Administered 2017-02-05: 3 mL via INTRAVENOUS

## 2017-02-05 MED ORDER — SODIUM CHLORIDE 0.9 % WEIGHT BASED INFUSION: 3.0000 mL/kg/h | INTRAVENOUS | Status: DC

## 2017-02-05 MED ORDER — MIDAZOLAM HCL 2 MG/2ML IJ SOLN
INTRAMUSCULAR | Status: AC
  Filled 2017-02-05: qty 2

## 2017-02-05 MED ORDER — IOPAMIDOL (ISOVUE-300) INJECTION 61%
INTRAVENOUS | Status: DC | PRN
  Administered 2017-02-05: 250 mL via INTRA_ARTERIAL

## 2017-02-05 MED ORDER — SODIUM CHLORIDE 0.9% FLUSH: 3.0000 mL | Freq: Two times a day (BID) | INTRAVENOUS | Status: DC

## 2017-02-05 MED ORDER — CLOPIDOGREL BISULFATE 75 MG PO TABS
ORAL_TABLET | ORAL | Status: AC
  Filled 2017-02-05: qty 8

## 2017-02-05 MED ORDER — SODIUM CHLORIDE 0.9 % IV SOLN: 250.0000 mL | INTRAVENOUS | Status: DC | PRN

## 2017-02-05 MED ORDER — CLOPIDOGREL BISULFATE 75 MG PO TABS
ORAL_TABLET | ORAL | Status: DC | PRN
  Administered 2017-02-05: 600 mg via ORAL

## 2017-02-05 MED ORDER — ASPIRIN 81 MG PO CHEW
CHEWABLE_TABLET | ORAL | Status: AC
  Filled 2017-02-05: qty 4

## 2017-02-05 MED ORDER — SODIUM CHLORIDE 0.9% FLUSH: 3.0000 mL | INTRAVENOUS | Status: DC | PRN

## 2017-02-05 MED ORDER — ALUM & MAG HYDROXIDE-SIMETH 200-200-20 MG/5ML PO SUSP
ORAL | Status: AC
  Filled 2017-02-05: qty 30

## 2017-02-05 MED ORDER — FENTANYL CITRATE (PF) 100 MCG/2ML IJ SOLN
INTRAMUSCULAR | Status: DC | PRN
  Administered 2017-02-05: 25 ug via INTRAVENOUS

## 2017-02-05 MED ORDER — NITROGLYCERIN 1 MG/10 ML FOR IR/CATH LAB
INTRA_ARTERIAL | Status: DC | PRN
  Administered 2017-02-05 (×2): 200 ug via INTRACORONARY

## 2017-02-05 MED ORDER — BIVALIRUDIN TRIFLUOROACETATE 250 MG IV SOLR
INTRAVENOUS | Status: AC
  Filled 2017-02-05: qty 250

## 2017-02-05 MED ORDER — ROSUVASTATIN CALCIUM 5 MG PO TABS
5.0000 mg | ORAL_TABLET | Freq: Every day | ORAL | Status: DC
  Administered 2017-02-05: 5 mg via ORAL
  Filled 2017-02-05: qty 1

## 2017-02-05 MED ORDER — ALUM & MAG HYDROXIDE-SIMETH 200-200-20 MG/5ML PO SUSP
30.0000 mL | Freq: Once | ORAL | Status: AC
  Administered 2017-02-05: 30 mL via ORAL

## 2017-02-05 MED ORDER — ASPIRIN 81 MG PO CHEW: 81.0000 mg | CHEWABLE_TABLET | ORAL | Status: DC

## 2017-02-05 MED ORDER — ASPIRIN 81 MG PO CHEW
CHEWABLE_TABLET | ORAL | Status: DC | PRN
Start: 2017-02-05 — End: 2017-02-05
  Administered 2017-02-05: 324 mg via ORAL

## 2017-02-05 MED ORDER — NITROGLYCERIN 5 MG/ML IV SOLN
INTRAVENOUS | Status: AC
  Filled 2017-02-05: qty 10

## 2017-02-05 MED ORDER — SODIUM CHLORIDE 0.9 % WEIGHT BASED INFUSION: 1.0000 mL/kg/h | INTRAVENOUS | Status: DC

## 2017-02-05 MED ORDER — HEPARIN (PORCINE) IN NACL 2-0.9 UNIT/ML-% IJ SOLN
INTRAMUSCULAR | Status: AC
  Filled 2017-02-05: qty 1000

## 2017-02-05 MED ORDER — SODIUM CHLORIDE 0.9 % WEIGHT BASED INFUSION: 1.0000 mL/kg/h | INTRAVENOUS | Status: AC

## 2017-02-05 MED ORDER — BIVALIRUDIN BOLUS VIA INFUSION - CUPID
INTRAVENOUS | Status: DC | PRN
  Administered 2017-02-05: 65.25 mg via INTRAVENOUS

## 2017-02-05 MED ORDER — SODIUM CHLORIDE 0.9 % IV SOLN
INTRAVENOUS | Status: AC | PRN
  Administered 2017-02-05: 1.75 mg/kg/h via INTRAVENOUS

## 2017-02-05 MED ORDER — MIDAZOLAM HCL 2 MG/2ML IJ SOLN
INTRAMUSCULAR | Status: DC | PRN
  Administered 2017-02-05: 1 mg via INTRAVENOUS

## 2017-02-05 SURGICAL SUPPLY — 18 items
BALLN TREK RX 2.25X15 (BALLOONS) ×4
BALLOON TREK RX 2.25X15 (BALLOONS) ×2 IMPLANT
CATH INFINITI 5FR ANG PIGTAIL (CATHETERS) ×4 IMPLANT
CATH INFINITI 5FR JL4 (CATHETERS) ×4 IMPLANT
CATH INFINITI JR4 5F (CATHETERS) ×8 IMPLANT
CATH VISTA GUIDE 6FR XB3.5 (CATHETERS) ×4 IMPLANT
DEVICE CLOSURE MYNXGRIP 6/7F (Vascular Products) ×4 IMPLANT
DEVICE INFLAT 30 PLUS (MISCELLANEOUS) ×4 IMPLANT
DEVICE SAFEGUARD 24CM (GAUZE/BANDAGES/DRESSINGS) ×4 IMPLANT
KIT MANI 3VAL PERCEP (MISCELLANEOUS) ×4 IMPLANT
NEEDLE PERC 18GX7CM (NEEDLE) ×4 IMPLANT
PACK CARDIAC CATH (CUSTOM PROCEDURE TRAY) ×4 IMPLANT
SHEATH AVANTI 5FR X 11CM (SHEATH) ×4 IMPLANT
SHEATH AVANTI 6FR X 11CM (SHEATH) ×4 IMPLANT
SHEATH BRITE TIP 6FR X 23 (SHEATH) ×4 IMPLANT
STENT SIERRA 2.25 X 15 MM (Permanent Stent) ×4 IMPLANT
WIRE ASAHI PROWATER 180CM (WIRE) ×4 IMPLANT
WIRE GUIDERIGHT .035X150 (WIRE) ×4 IMPLANT

## 2017-02-05 NOTE — Progress Notes (Signed)
Pt allowed to sit up. Groin pad deflated. Pt denies any discomfort. Left arm elevated on pillow for comfort.

## 2017-02-05 NOTE — Progress Notes (Signed)
84 year old female with PMHx of left breast cancer - s/p mastectomy and chemo therapy / radiation - presently in remission; HTN, anxiety, sleep apnea.  Patient also has ongoing issues with lymphedema of her left arm for which she wears a automatic pump/sleeve several times ag day to help with the edema.  Patient presented to ED with chest pain.    Patient with elevated troponins and was diagnosed with NSTEMI.  Patient underwent cardiac catheterization today.   Coronary Angiography with Coronary Stent Intervention:   Conclusion:    Prox Cx lesion is 99% stenosed.  Ost 1st Mrg lesion is 60% stenosed.  1st Mrg lesion is 70% stenosed.  Prox LAD lesion is 40% stenosed.  Prox RCA to Mid RCA lesion is 50% stenosed.  A drug-eluting stent was successfully placed using a STENT SIERRA 2.25 X 15 MM.  Post intervention, there is a 0% residual stenosis.   1.  One-vessel coronary artery disease with high-grade 99% stenosis mid left circumflex 2.  Mildly reduced left ventricular function with inferior wall hypokinesis 3.  Successful PCI, with DES mid left circumflex   Patient referred to Cardiac Rehab with dx of NSTEMI / S/P Coronary Stents.    Patient lying in bed resting.  Patient's sister at bedside.  Patient from home with sister.    ? "Heart Attack Bouncing Back" booklet given and reviewed with patient and daughter. Discussed the definition of CAD. Reviewed the location CAD and where stent was?placed. Explained to patient about the stent card. Explained the purpose of the stent card. Instructed patient to keep stent card in her wallet.   Discussed modifiable risk factors including controlling blood pressure, cholesterol, and blood sugar; following heart healthy diet; maintaining healthy weight; exercise; and smoking cessation, if applicable. ?Note: Patient is a NEVER smoker.   Discussed cardiac medications including rationale for taking, mechanisms of action, and side effects. Stressed the  importance of taking medications as prescribed.  Discussed emergency plan for heart attack symptoms. Patient verbalized understanding of need to call 911 and not to drive herself to ER if having cardiac symptoms / chest pain.   Heart healthy diet of low sodium, low fat, low cholesterol heart healthy/carb modified diet discussed. ?Information on diet provided.  Smoking Cessation - Patient is a NEVER smoker.? ?? Exercise - Patient does not currently exercise. She ambulates at home with the assistance of her walker.  Sister assists with putting her clothes, shoes, and socks on.  Benefits of exercise discussed. Explained to patient she had been referred to the Cardiac Rehab program here at [facility].  Patient is familiar with the programs in the Rehabilitation Department here at [facility], as patient participated in [place] [facility] and Exercise) program.  Sister is encouraging patient to participate as she thinks it will be good for her.  Patient and sister reported the physician had already mentioned Cardiac Rehab to them.  The Sister is patient's transportation and does not like to drive after dark.  Also, because they live 35 minutes from here it would be very difficult for the sister to get patient to the a.m. Classes.  Sister and patient are thinking a a good time for patient to participate in Cardiac Rehab would be when the time changes back to Daylight Savings Time in early March.  They both reported they will be away for their annual beach trip the end of February.  Cardiac Rehab brochure with department phone number provided to patient.?Information sheet along with CPT billing  codes also provided to patient.  [facility] patient and sister of this support group.  Brochure provided. Next meeting will be February 16, 2017 at 11:30 a.m. Patient's sister indicated that they would probably not be able to attend the meeting in February as it takes so  long to get ready and to drive in and then drive home.    Patient for probable discharge in the a.m.  Discussed patient with RNCM to assess patient for any in-home needs.    Patient and sister thanked me for the above information.  ? Roanna Epley, RN, BSN, [facility] Cardiovascular and Pulmonary Nurse Navigator

## 2017-02-05 NOTE — Progress Notes (Signed)
Accepted in transfer from cath lab. She is a/o and painfree. Rt groin soft with inflated pad on. Good ped pulses. Vass. Family at bedside.

## 2017-02-05 NOTE — Progress Notes (Signed)
*  PRELIMINARY RESULTS* Echocardiogram 2D Echocardiogram has been performed.  Sherrie Sport 02/05/2017, 12:07 PM

## 2017-02-05 NOTE — Progress Notes (Addendum)
[facility] NAME: Janet Elliott    MR#:  [id number]  DATE OF BIRTH:  [birth year]  SUBJECTIVE:  CHIEF COMPLAINT:   Chief Complaint  Patient presents with  . Chest Pain   - Came in with chest pain, much improved now. Troponins are elevated. Remains on heparin drip. -For cardiac catheterization today  REVIEW OF SYSTEMS:  Review of Systems  Constitutional: Negative for chills and fever.  HENT: Positive for hearing loss. Negative for congestion, ear discharge and nosebleeds.   Eyes: Negative for blurred vision.  Respiratory: Negative for cough, shortness of breath and wheezing.   Cardiovascular: Positive for chest pain. Negative for palpitations and leg swelling.  Gastrointestinal: Negative for abdominal pain, constipation, diarrhea, nausea and vomiting.  Genitourinary: Negative for dysuria.  Musculoskeletal: Positive for back pain and myalgias.  Neurological: Positive for weakness. Negative for dizziness, sensory change, speech change, focal weakness, seizures and headaches.  Psychiatric/Behavioral: Negative for depression.    DRUG ALLERGIES:   Allergies  Allergen Reactions  . Codeine     Dizzy and nauseaus  . Statins Other (See Comments)    VITALS:  Blood pressure (!) 156/79, pulse 80, temperature 97.9 F (36.6 C), temperature source Oral, resp. rate 15, height 4\' 11"  (1.499 m), weight 87 kg (191 lb 11.2 oz), SpO2 98 %.  PHYSICAL EXAMINATION:  Physical Exam  GENERAL:  84 y.o.-year-old patient lying in the bed with no acute distress.  EYES: Pupils equal, round, reactive to light and accommodation. No scleral icterus. Extraocular muscles intact.  HEENT: Head atraumatic, normocephalic. Oropharynx and nasopharynx clear.  NECK:  Supple, no jugular venous distention. No thyroid enlargement, no tenderness.  LUNGS: Normal breath sounds bilaterally, no wheezing, rales,rhonchi or crepitation. No use of accessory muscles of  respiration. Decreased bibasilar breath sounds  CARDIOVASCULAR: S1, S2 normal. No rubs, or gallops. 2/6 systolic murmur present ABDOMEN: Soft, nontender, nondistended. Bowel sounds present. No organomegaly or mass.  EXTREMITIES: No pedal edema, cyanosis, or clubbing. Left arm is swollen due to lymphedema NEUROLOGIC: Cranial nerves II through XII are intact. Muscle strength 5/5 in all extremities. Sensation intact. Gait not checked.  PSYCHIATRIC: The patient is alert and oriented x 3.  SKIN: No obvious rash, lesion, or ulcer.    LABORATORY PANEL:   CBC Recent Labs  Lab 02/05/17 0427  WBC 7.9  HGB 12.4  HCT 36.8  PLT 193   ------------------------------------------------------------------------------------------------------------------  Chemistries  Recent Labs  Lab 02/04/17 1421 02/05/17 0427  NA  --  134*  K  --  4.2  CL  --  101  CO2  --  23  GLUCOSE  --  201*  BUN  --  20  CREATININE  --  0.81  CALCIUM  --  9.1  MG 1.9  --    ------------------------------------------------------------------------------------------------------------------  Cardiac Enzymes Recent Labs  Lab 02/04/17 2022  TROPONINI 1.68*   ------------------------------------------------------------------------------------------------------------------  RADIOLOGY:  Dg Chest 2 View  Result Date: 02/04/2017 CLINICAL DATA:  Chest pain. EXAM: CHEST  2 VIEW COMPARISON:  10/11/2006. FINDINGS: Mediastinum hilar structures normal. Lungs are clear. No pleural effusion or pneumothorax. Cardiomegaly with normal pulmonary vascularity. IMPRESSION: No active cardiopulmonary disease. Electronically Signed   By: Marcello Moores  Register   On: 02/04/2017 11:12    EKG:   Orders placed or performed during the hospital encounter of 02/04/17  . EKG 12-Lead  . EKG 12-Lead  . ED EKG within 10 minutes  . ED EKG within 10  minutes  . EKG 12-Lead immediately post procedure  . EKG 12-Lead  . EKG 12-Lead  . EKG 12-Lead  immediately post procedure    ASSESSMENT AND PLAN:   84 year old female with past medical history significant for left breast cancer status post mastectomy, chemoradiation currently in remission, hypertension, anxiety and sleep apnea presents to the hospital secondary to chest pain.  1. NSTEMI - much improved chest pain now. -Troponins have been elevated. Remains on heparin drip -Appreciate cardiology consult. Going for cardiac catheterization today. -Started on aspirin, metoprolol. Change statin to high intensity statin. -Sublingual nitroglycerin when necessary - ECHO done and pending  2. Hypertension-on metoprolol and losartan  3. Neurogenic bladder-continue oxybutynin-  4. GERD-Protonix  5. History of breast cancer-status post left mastectomy, chemotherapy and radiation. Currently in remission. Also has left arm lymphedema and uses a lymphedema pump.  6. DVT prophylaxis-currently on heparin drip    All the records are reviewed and case discussed with Care Management/Social Workerr. Management plans discussed with the patient, family and they are in agreement.  CODE STATUS: Full code  TOTAL TIME TAKING CARE OF THIS PATIENT: 38 minutes.   POSSIBLE D/C IN 1-2 DAYS, DEPENDING ON CLINICAL CONDITION.   Beverley Allender M.D on 02/05/2017 at 11:53 AM  Between 7am to 6pm - Pager - [phone]  After 6pm go to [email or web address] - password [email or web address] [facility]  Office  [phone]  CC: Primary care physician; Kirk [place], MD

## 2017-02-05 NOTE — Care Management (Signed)
Patient admitted from home for nstemi. Cardiac cath with PCI.  lives with her sister who provides her with assist with adls.  Patient and her sister Janet Elliott feel that there is adequate support in the home.  Patient is ambulatory with a walker "and I am slow"  No issues getting to physician follow up appointments or paying for meds.  Reached out to attending for PT consult when patient is able to be out of bed.  Patient and sister would be agreeable to consider home health if it is felt it is needed.

## 2017-02-05 NOTE — Progress Notes (Signed)
ANTICOAGULATION CONSULT NOTE - Initial Consult  Pharmacy Consult for heparin Indication: chest pain/ACS  Allergies  Allergen Reactions  . Codeine     Other reaction(s): Unknown  . Statins Other (See Comments)    Patient Measurements: Height: 4\' 11"  (149.9 cm) Weight: 200 lb (90.7 kg) IBW/kg (Calculated) : 43.2 Heparin Dosing Weight: 65 kg  Vital Signs: Temp: 98.1 F (36.7 C) (02/01 0404) Temp Source: Oral (02/01 0404) BP: 134/71 (02/01 0407) Pulse Rate: 77 (02/01 0407)  Labs: Recent Labs    02/04/17 1052 02/04/17 1145 02/04/17 1155 02/04/17 1421 02/04/17 2022 02/05/17 0427  HGB 13.6  --   --   --   --  12.4  HCT 41.4  --   --   --   --  36.8  PLT 203  --   --   --   --  193  APTT  --   --  26  --   --   --   LABPROT  --   --  12.3  --   --   --   INR  --   --  0.92  --   --   --   HEPARINUNFRC  --   --   --   --  0.29* 0.44  CREATININE  --  0.99  --   --   --  0.81  TROPONINI 0.79*  --   --  1.30* 1.68*  --     Estimated Creatinine Clearance: 52.6 mL/min (by C-G formula based on SCr of 0.81 mg/dL).   Medical History: Past Medical History:  Diagnosis Date  . Anxiety   . Arthritis    osteoarthritis  . Breast cancer ([place]) 2002   Left  chemo/radistion  . Carcinoma of left breast ([place])   . Lymphedema   . Sleep apnea     Medications:  Infusions:  . sodium chloride    . sodium chloride    . sodium chloride     Followed by  . sodium chloride    . heparin 900 Units/hr (02/04/17 2150)    Assessment: 39 yof to ED with CP on and off x 4 days. Initial troponin 0.79. ECG NSR, septal infarct of undetermined age, abnormal ECG but no change from previous (2013). Pharmacy consulted to dose heparin for ACS. No PTA OAC listed but med rec not completed. Will f/u.    Goal of Therapy:  Heparin level 0.3-0.7 units/ml Monitor platelets by anticoagulation protocol: Yes   Plan:  Give 3900 units bolus x 1 Start heparin infusion at 800 units/hr Check anti-Xa level  in 8 hours and daily while on heparin Continue to monitor H&H and platelets   1/31 :  HL @ 20:30 = 0.29 Will order Heparin 1000 units IV X 1 bolus and increase drip rate to 900 units/hr.  Will recheck HL 8 hrs after rate change.   02/01 @ 0427 HL 0.44 therapeutic. Will continue current rate and will recheck HL @ [address] Pharm.D. Clinical Pharmacist 02/05/2017,5:33 AM

## 2017-02-06 LAB — BASIC METABOLIC PANEL
ANION GAP: 10 (ref 5–15)
BUN: 16 mg/dL (ref 6–20)
CHLORIDE: 100 mmol/L — AB (ref 101–111)
CO2: 24 mmol/L (ref 22–32)
Calcium: 8.8 mg/dL — ABNORMAL LOW (ref 8.9–10.3)
Creatinine, Ser: 0.69 mg/dL (ref 0.44–1.00)
GFR calc non Af Amer: >60 mL/min (ref >60)
Glucose, Bld: 174 mg/dL — ABNORMAL HIGH (ref 65–99)
Potassium: 4.4 mmol/L (ref 3.5–5.1)
Sodium: 134 mmol/L — ABNORMAL LOW (ref 135–145)

## 2017-02-06 LAB — GLUCOSE, CAPILLARY
GLUCOSE-CAPILLARY: 279 mg/dL — AB (ref 65–99)
Glucose-Capillary: 168 mg/dL — ABNORMAL HIGH (ref 65–99)

## 2017-02-06 MED ORDER — ASPIRIN EC 81 MG PO TBEC
81.0000 mg | DELAYED_RELEASE_TABLET | Freq: Every day | ORAL | Status: DC
  Administered 2017-02-06: 81 mg via ORAL
  Filled 2017-02-06: qty 1

## 2017-02-06 MED ORDER — CLOPIDOGREL BISULFATE 75 MG PO TABS
75.0000 mg | ORAL_TABLET | Freq: Every day | ORAL | Status: DC
  Administered 2017-02-06: 75 mg via ORAL
  Filled 2017-02-06: qty 1

## 2017-02-06 MED ORDER — METOPROLOL TARTRATE 25 MG PO TABS: 25.0000 mg | ORAL_TABLET | Freq: Two times a day (BID) | ORAL | 0 refills | Status: AC

## 2017-02-06 MED ORDER — CLOPIDOGREL BISULFATE 75 MG PO TABS: 75.0000 mg | ORAL_TABLET | Freq: Every day | ORAL | 0 refills | Status: AC

## 2017-02-06 NOTE — Progress Notes (Signed)
[facility] Cardiology  SUBJECTIVE: Patient laying in bed, denies chest pain or shortness of breath   Vitals:   02/05/17 1727 02/05/17 1924 02/06/17 0436 02/06/17 0752  BP: (!) 128/57 125/64 135/60 139/66  Pulse: 73 82 73 73  Resp:  16 18   Temp: (!) 97.4 F (36.3 C) 98.1 F (36.7 C) 98.2 F (36.8 C) 98.3 F (36.8 C)  TempSrc: Oral Oral Oral Oral  SpO2: 98% 94% 96% 96%  Weight:   86.9 kg (191 lb 9.6 oz)   Height:         Intake/Output Summary (Last 24 hours) at 02/06/2017 0855 Last data filed at 02/06/2017 0435 Gross per 24 hour  Intake 240 ml  Output 1850 ml  Net -1610 ml      PHYSICAL EXAM  General: Well developed, well nourished, in no acute distress HEENT:  Normocephalic and atramatic Neck:  No JVD.  Lungs: Clear bilaterally to auscultation and percussion. Heart: HRRR . Normal S1 and S2 without gallops or murmurs.  Abdomen: Bowel sounds are positive, abdomen soft and non-tender  Msk:  Back normal, normal gait. Normal strength and tone for age. Extremities: Bilateral upper extremity lymphedema Neuro: Alert and oriented X 3. Psych:  Good affect, responds appropriately   LABS: Basic Metabolic Panel: Recent Labs    02/04/17 1421 02/05/17 0427 02/06/17 0632  NA  --  134* 134*  K  --  4.2 4.4  CL  --  101 100*  CO2  --  23 24  GLUCOSE  --  201* 174*  BUN  --  20 16  CREATININE  --  0.81 0.69  CALCIUM  --  9.1 8.8*  MG 1.9  --   --    Liver Function Tests: No results for input(s): AST, ALT, ALKPHOS, BILITOT, PROT, ALBUMIN in the last 72 hours. No results for input(s): LIPASE, AMYLASE in the last 72 hours. CBC: Recent Labs    02/04/17 1052 02/05/17 0427  WBC 9.1 7.9  HGB 13.6 12.4  HCT 41.4 36.8  MCV 95.3 95.3  PLT 203 193   Cardiac Enzymes: Recent Labs    02/04/17 1052 02/04/17 1421 02/04/17 2022  TROPONINI 0.79* 1.30* 1.68*   BNP: Invalid input(s): POCBNP D-Dimer: No results for input(s): DDIMER in the last 72 hours. Hemoglobin A1C: Recent  Labs    02/04/17 1421  HGBA1C 8.0*   Fasting Lipid Panel: Recent Labs    02/05/17 0427  CHOL 165  HDL 46  LDLCALC 79  TRIG 199*  CHOLHDL 3.6   Thyroid Function Tests: No results for input(s): TSH, T4TOTAL, T3FREE, THYROIDAB in the last 72 hours.  Invalid input(s): FREET3 Anemia Panel: No results for input(s): VITAMINB12, FOLATE, FERRITIN, TIBC, IRON, RETICCTPCT in the last 72 hours.  Dg Chest 2 View  Result Date: 02/04/2017 CLINICAL DATA:  Chest pain. EXAM: CHEST  2 VIEW COMPARISON:  10/11/2006. FINDINGS: Mediastinum hilar structures normal. Lungs are clear. No pleural effusion or pneumothorax. Cardiomegaly with normal pulmonary vascularity. IMPRESSION: No active cardiopulmonary disease. Electronically Signed   By: Marcello Moores  Register   On: 02/04/2017 11:12     Echo normal left ventricular function, with LVEF 50-55%  TELEMETRY: Normal sinus rhythm:  ASSESSMENT AND PLAN:  Active Problems:   NSTEMI (non-ST elevated myocardial infarction) ([place])    1.  Non-STEMI, peak troponin 1.68 2.  Status post DES proximal left circumflex without recurrent chest pain  Recommendations  1.  Agree with current therapy 2.  Continue dual antiplatelet therapy uninterrupted  for 1 year 3.  May discharge home today 4.  Follow-up in 1 week   Janet Cowman, MD, PhD, [facility] 02/06/2017 8:55 AM

## 2017-02-06 NOTE — Discharge Summary (Signed)
[facility] [place] NAME: Janet Elliott    MR#:  [id number]  DATE OF BIRTH:  [birth year]  DATE OF ADMISSION:  02/04/2017 ADMITTING PHYSICIAN: Demetrios Loll, MD  DATE OF DISCHARGE: 02/06/2017  PRIMARY CARE PHYSICIAN: Kirk Ruths, MD    ADMISSION DIAGNOSIS:  NSTEMI (non-ST elevated myocardial infarction) ([place]) [I21.4]  DISCHARGE DIAGNOSIS:  Active Problems:   NSTEMI (non-ST elevated myocardial infarction) ([place])   SECONDARY DIAGNOSIS:   Past Medical History:  Diagnosis Date  . Anxiety   . Arthritis    osteoarthritis  . Breast cancer ([place]) 2002   Left  chemo/radistion  . Carcinoma of left breast ([place])   . Lymphedema   . Sleep apnea     HOSPITAL COURSE:   84 year old female with past medical history significant for left breast cancer status post mastectomy, chemoradiation currently in remission, hypertension, anxiety and sleep apnea presents to the hospital secondary to chest pain.  1. NSTEMI - much improved chest pain now. -Troponins have been elevated.  - Appreciate cardiology consult.  -Started on aspirin, metoprolol. Change statin to high intensity statin. -Sublingual nitroglycerin when necessary - ECHO done  - Left circumflex Stent placed - Cardio suggest ASA, plavix, Statin, Metoprolol on d/c.  2. Hypertension-on metoprolol and losartan  3. Neurogenic bladder-continue oxybutynin-  4. GERD-Protonix  5. History of breast cancer-status post left mastectomy, chemotherapy and radiation. Currently in remission. Also has left arm lymphedema and uses a lymphedema pump.  6. DVT prophylaxis- Ambulating now planned for discharge.    DISCHARGE CONDITIONS:   Stable.  CONSULTS OBTAINED:  Treatment Team:  Isaias Cowman, MD  DRUG ALLERGIES:   Allergies  Allergen Reactions  . Codeine     Dizzy and nauseaus  . Statins Other (See Comments)    DISCHARGE MEDICATIONS:   Allergies as of 02/06/2017       Reactions   Codeine    Dizzy and nauseaus   Statins Other (See Comments)      Medication List    TAKE these medications   aspirin EC 81 MG tablet Take 81 mg by mouth every other day.   CITRUCEL 500 MG Tabs Generic drug:  Methylcellulose (Laxative) Take 1 tablet by mouth daily at 6 (six) AM.   clopidogrel 75 MG tablet Commonly known as:  PLAVIX Take 1 tablet (75 mg total) by mouth daily.   diclofenac sodium 1 % Gel Commonly known as:  VOLTAREN Apply 2 g topically 4 (four) times daily. Reported on 05/29/2015   esomeprazole 20 MG capsule Commonly known as:  NEXIUM Take 20 mg by mouth daily.   fexofenadine 180 MG tablet Commonly known as:  ALLEGRA Take 180 mg by mouth daily.   glimepiride 4 MG tablet Commonly known as:  AMARYL Take 4 mg by mouth daily with breakfast.   losartan 100 MG tablet Commonly known as:  COZAAR Take 100 mg by mouth daily. Reported on 05/29/2015   metoprolol tartrate 25 MG tablet Commonly known as:  LOPRESSOR Take 1 tablet (25 mg total) by mouth 2 (two) times daily.   ONE TOUCH ULTRA TEST test strip Generic drug:  glucose blood CHECK BLOOD SUGAR TWICE A DAY   ONGLYZA 5 MG Tabs tablet Generic drug:  saxagliptin HCl Take 5 mg by mouth every morning.   oxybutynin 5 MG 24 hr tablet Commonly known as:  DITROPAN-XL Take 5 mg by mouth at bedtime.   PARoxetine 20 MG tablet Commonly known as:  PAXIL  Take 10 mg by mouth daily.   PHILLIPS COLON HEALTH PO Take 1 capsule by mouth every morning.   pravastatin 10 MG tablet Commonly known as:  PRAVACHOL Take 10 mg by mouth 3 (three) times a week.   pregabalin 50 MG capsule Commonly known as:  LYRICA Take 50 mg by mouth every evening.   UNISTIK 2 NORMAL Misc USE AS DIRECTED   V-R VITAMIN B-12 500 MCG tablet Generic drug:  cyanocobalamin Take 500 mcg by mouth.   Vitamin D (Ergocalciferol) 50000 units Caps capsule Commonly known as:  DRISDOL Take 50,000 Units by mouth every 7 (seven)  days.        DISCHARGE INSTRUCTIONS:    Follow with cardiology clinic in 1-2 weeks  If you experience worsening of your admission symptoms, develop shortness of breath, life threatening emergency, suicidal or homicidal thoughts you must seek medical attention immediately by calling 911 or calling your MD immediately  if symptoms less severe.  You Must read complete instructions/literature along with all the possible adverse reactions/side effects for all the Medicines you take and that have been prescribed to you. Take any new Medicines after you have completely understood and accept all the possible adverse reactions/side effects.   Please note  You were cared for by a hospitalist during your hospital stay. If you have any questions about your discharge medications or the care you received while you were in the hospital after you are discharged, you can call the unit and asked to speak with the hospitalist on call if the hospitalist that took care of you is not available. Once you are discharged, your primary care physician will handle any further medical issues. Please note that NO REFILLS for any discharge medications will be authorized once you are discharged, as it is imperative that you return to your primary care physician (or establish a relationship with a primary care physician if you do not have one) for your aftercare needs so that they can reassess your need for medications and monitor your lab values.    Today   CHIEF COMPLAINT:   Chief Complaint  Patient presents with  . Chest Pain    HISTORY OF PRESENT ILLNESS:  Janet Elliott  is a 84 y.o. female presented to ED with chest pain on and off for the past few days, which she is in the central area of the chest, tight, intermittent, 5 out of 10 with a radiation to the left shoulder.  The patient denies any diaphoresis, nausea, vomiting or diarrhea.  Troponin level is elevated at 0.79.  Heparin drip is started.   VITAL SIGNS:   Blood pressure 139/66, pulse 73, temperature 98.3 F (36.8 C), temperature source Oral, resp. rate 18, height 4\' 11"  (1.499 m), weight 86.9 kg (191 lb 9.6 oz), SpO2 96 %.  I/O:    Intake/Output Summary (Last 24 hours) at 02/06/2017 1252 Last data filed at 02/06/2017 1012 Gross per 24 hour  Intake 480 ml  Output 1850 ml  Net -1370 ml    PHYSICAL EXAMINATION:  GENERAL:  84 y.o.-year-old patient lying in the bed with no acute distress.  EYES: Pupils equal, round, reactive to light and accommodation. No scleral icterus. Extraocular muscles intact.  HEENT: Head atraumatic, normocephalic. Oropharynx and nasopharynx clear.  NECK:  Supple, no jugular venous distention. No thyroid enlargement, no tenderness.  LUNGS: Normal breath sounds bilaterally, no wheezing, rales,rhonchi or crepitation. No use of accessory muscles of respiration.  CARDIOVASCULAR: S1, S2 normal. No murmurs, rubs,  or gallops.  ABDOMEN: Soft, non-tender, non-distended. Bowel sounds present. No organomegaly or mass.  EXTREMITIES: No pedal edema, cyanosis, or clubbing.  NEUROLOGIC: Cranial nerves II through XII are intact. Muscle strength 5/5 in all extremities. Sensation intact. Gait not checked.  PSYCHIATRIC: The patient is alert and oriented x 3.  SKIN: No obvious rash, lesion, or ulcer.   DATA REVIEW:   CBC Recent Labs  Lab 02/05/17 0427  WBC 7.9  HGB 12.4  HCT 36.8  PLT 193    Chemistries  Recent Labs  Lab 02/04/17 1421  02/06/17 0632  NA  --    < > 134*  K  --    < > 4.4  CL  --    < > 100*  CO2  --    < > 24  GLUCOSE  --    < > 174*  BUN  --    < > 16  CREATININE  --    < > 0.69  CALCIUM  --    < > 8.8*  MG 1.9  --   --    < > = values in this interval not displayed.    Cardiac Enzymes Recent Labs  Lab 02/04/17 2022  TROPONINI 1.68*    Microbiology Results  No results found for this or any previous visit.  RADIOLOGY:  No results found.  EKG:   Orders placed or performed during the  hospital encounter of 02/04/17  . EKG 12-Lead  . EKG 12-Lead  . ED EKG within 10 minutes  . ED EKG within 10 minutes  . EKG 12-Lead immediately post procedure  . EKG 12-Lead  . EKG 12-Lead  . EKG 12-Lead immediately post procedure      Management plans discussed with the patient, family and they are in agreement.  CODE STATUS:     Code Status Orders  (From admission, onward)        Start     Ordered   02/04/17 1355  Full code  Continuous     02/04/17 1354    Code Status History    Date Active Date Inactive Code Status Order ID Comments User Context   This patient has a current code status but no historical code status.    Advance Directive Documentation     Most Recent Value  Type of Advance Directive  Living will, Healthcare Power of Attorney  Pre-existing out of facility DNR order (yellow form or pink MOST form)  No data  "MOST" Form in Place?  No data      TOTAL TIME TAKING CARE OF THIS PATIENT: 35 minutes.    Vaughan Basta M.D on 02/06/2017 at 12:52 PM  Between 7am to 6pm - Pager - [phone]  After 6pm go to [email or web address] - password [email or web address] [facility]  Office  [phone]  CC: Primary care physician; Kirk Ruths, MD   Note: This dictation was prepared with Dragon dictation along with smaller phrase technology. Any transcriptional errors that result from this process are unintentional.

## 2017-04-20 ENCOUNTER — Ambulatory Visit: Payer: Medicare Other | Attending: Physical Medicine and Rehabilitation

## 2017-04-20 ENCOUNTER — Other Ambulatory Visit: Payer: Self-pay

## 2017-04-20 DIAGNOSIS — G8929 Other chronic pain: Secondary | ICD-10-CM | POA: Insufficient documentation

## 2017-04-20 DIAGNOSIS — M545 Low back pain: Secondary | ICD-10-CM | POA: Diagnosis present

## 2017-04-20 DIAGNOSIS — M6281 Muscle weakness (generalized): Secondary | ICD-10-CM | POA: Diagnosis present

## 2017-04-20 NOTE — Therapy (Signed)
[place] MAIN [facility] SERVICES [address] [place], [place], [address] Phone: [phone]   Fax:  [phone]  Physical Therapy Evaluation  Patient Details  Name: Janet Elliott MRN: [id number] Date of Birth: March 27, 1932 Referring Provider: Sharlet Salina, MD   Encounter Date: 04/20/2017  PT End of Session - 04/20/17 1616    Visit Number  1    Number of Visits  8    Date for PT Re-Evaluation  05/18/17    Authorization Type  1/10    PT Start Time  1430    PT Stop Time  1528    PT Time Calculation (min)  58 min    Equipment Utilized During Treatment  Gait belt    Activity Tolerance  Patient limited by pain;Treatment limited secondary to medical complications (Comment)    Behavior During Therapy  [facility] for tasks assessed/performed difficulty to orient        Past Medical History:  Diagnosis Date  . Anxiety   . Arthritis    osteoarthritis  . Breast cancer ([place]) 2002   Left  chemo/radistion  . Carcinoma of left breast ([place])   . Lymphedema   . Sleep apnea     Past Surgical History:  Procedure Laterality Date  . BREAST BIOPSY Right 1980  . CORONARY ANGIOGRAPHY Left 02/05/2017   Procedure: CORONARY ANGIOGRAPHY;  Surgeon: Isaias Cowman, MD;  Location: [place] [facility];  Service: Cardiovascular;  Laterality: Left;  . CORONARY STENT INTERVENTION N/A 02/05/2017   Procedure: CORONARY STENT INTERVENTION;  Surgeon: Isaias Cowman, MD;  Location: [place] [facility];  Service: Cardiovascular;  Laterality: N/A;  . EYE SURGERY    . MASTECTOMY Left 2002    There were no vitals filed for this visit.      Subjective Assessment - 04/20/17 1611    Subjective  Patient is a pleasant 84 year old female who presents with low back pain.     Pertinent History  84 year old female with past medical history significant for left breast cancer status post mastectomy, chemoradiation currently in remission, hypertension, anxiety and sleep apnea presents  with HNP (herniated nucleus pulposus) lumbar, lumbar stenosis with neurogenic claudication, and lumbar radiculitis.  Patient also has left arm lymphedema and uses a lymphedema pump and had NSTEMI on 02/04/17 resulting in left coronary angiography. Pt. has been followed by her doctor for chronic low-back pain for years with radiation into the bilateral buttock, lateral thigh and lateral aspect of the lower leg. Her symptoms have been more consistent with an L5 radiculitis. MRI dated 11/10/12 demonstrates at L5-S1 moderate degenerative disc disease and moderate bilateral foraminal stenosis. At L4-5 there is a central and left paracentral disc herniation with impingement of the left L4 nerve root in the lateral recess. There is severe central stenosis. Her pain reaches moderate to severe intensity prior to injections.    Limitations  Lifting;Sitting;Standing;Walking;House hold activities    How long can you sit comfortably?  5 minutes    How long can you stand comfortably?  unable to stand independently    How long can you walk comfortably?  unable to stand independently    Diagnostic tests  MRI: MRI dated 11/10/12 demonstrates at L5-S1 moderate degenerative disc disease and moderate bilateral foraminal stenosis. At L4-5 there is a central and left paracentral disc herniation with impingement of the left L4 nerve root in the lateral recess. There is severe central stenosis.    Patient Stated Goals  less pain, increased mobility  Currently in Pain?  No/denies      PAIN: 0/10 current Worst pain: 6/10      SPECIAL TESTS: Bilateral knee to chest painful LE rotation decrease pain SLR+on RLE Posterior pelvic tilt reduce pain TrA activation limited   FUNCTIONAL MOBILITY: Mod-Max A STS from [facility]    Balance Static Sitting Balance  Normal Able to maintain balance against maximal resistance   Good Able to maintain balance against moderate resistance   Good-/Fair+ Accepts minimal resistance   Fair Able  to sit unsupported without balance loss and without UE support x  Poor+ Able to maintain with Minimal assistance from individual or chair   Poor Unable to maintain balance-requires mod/max support from individual or chair     Dynamic Sitting Balance  Normal Able to sit unsupported and weight shift across midline maximally   Good Able to sit unsupported and weight shift across midline moderately   Good-/Fair+ Able to sit unsupported and weight shift across midline minimally   Fair Minimal weight shifting ipsilateral/front, difficulty crossing midline   Fair- Reach to ipsilateral side and unable to weight shift   Poor + Able to sit unsupported with min A and reach to ipsilateral side, unable to weight shift x  Poor Able to sit unsupported with mod A and reach ipsilateral/front-can't cross midline     OUTCOME MEASURES: TEST Outcome Interpretation  MODI 66% Extreme disability   LEFS 10/80 Extreme perceived disability                          [facility] PT Assessment - 04/20/17 0001      Assessment   Medical Diagnosis  low back pain with BLE pain    Referring Provider  Chasnis, MD    Onset Date/Surgical Date  -- patient reports multiple years    Hand Dominance  Right    Next MD Visit  -- soon for another injection.     Prior Therapy  PT multiple times for balance and shoulder       Precautions   Precaution Comments  LUE lymphedema don't exert L arm too much.       Restrictions   Weight Bearing Restrictions  No      Balance Screen   Has the patient fallen in the past 6 months  Yes    How many times?  1    Has the patient had a decrease in activity level because of a fear of falling?   Yes    Is the patient reluctant to leave their home because of a fear of falling?   Yes      [place]  Private residence    Living Arrangements  Other relatives    Available Help at Discharge  Family    Type of [place] - 2 wheels;Grab bars - toilet;Grab bars - tub/shower;Toilet riser;Shower seat      Prior Function   Level of Independence  Independent with basic ADLs;Needs assistance with homemaking    Vocation  Retired    Leisure  travel, reading papers/magazines      Cognition   Overall Cognitive Status  History of cognitive impairments - at baseline difficulty with short term memory,     Attention  -- challenged focus on task at hand, divergent with discussion  Memory  Impaired    Memory Impairment  Decreased short term memory    Behaviors  Perseveration      Observation/Other Assessments   Observations  lymphedema LUE,     Skin Integrity  bruising of L shin and knee     Other Surveys   Other Surveys    Modified [place]  66% extreme disability     Lower Extremity Functional Scale   10/80      Sensation   Light Touch  Appears Intact LE       Coordination   Gross Motor Movements are Fluid and Coordinated  No    Heel Shin Test  patient unable to perform, unable to lift legs repetitively       Posture/Postural Control   Posture/Postural Control  Postural limitations    Postural Limitations  Flexed trunk      ROM / Strength   AROM / PROM / Strength  AROM;Strength      AROM   Overall AROM   Deficits;Other (comment)    Overall AROM Comments  unable to assess lumbar ROM in standing due to need for assistance for standing.     AROM Assessment Site  Lumbar    Lumbar Flexion  painful    Lumbar Extension  nonpainful    Lumbar - Right Side Bend  limited    Lumbar - Left Side Bend  limited    Lumbar - Right Rotation  limited    Lumbar - Left Rotation  limited       Strength   Overall Strength  Deficits    Strength Assessment Site  Hip;Knee;Lumbar    Right/Left Hip  Right;Left    Right Hip Flexion  2+/5    Right Hip Extension  2+/5    Right Hip ABduction  2+/5    Right Hip ADduction  2+/5    Left Hip Flexion  2+/5    Left Hip Extension   2+/5    Left Hip ABduction  2+/5    Left Hip ADduction  2+/5    Right/Left Knee  Right;Left    Right Knee Flexion  3-/5    Right Knee Extension  3-/5    Left Knee Flexion  3-/5    Left Knee Extension  3-/5    Lumbar Flexion  2+/5    Lumbar Extension  2+/5      Flexibility   Soft Tissue Assessment /Muscle Length  yes    Hamstrings  limited bilaterally, pain in low back upon PROM past 38 degrees    Quadriceps  tight illiopsoas     Quadratus Lumborum  tight R>L      Palpation   Spinal mobility  tight paraspinal musculature bilaterally with pain in R       Special Tests   Other special tests  Bilateral Knee to chest painful: LE rotation decreased pain, SLR +, posterior pelvic tilt reduce pain      Bed Mobility   Bed Mobility  Supine to Sit;Sit to Supine    Supine to Sit  3: Mod assist;HOB flat    Supine to Sit Details (indicate cue type and reason)  required Mod A for UE and LE     Sit to Supine  3: Mod assist    Sit to Supine - Details (indicate cue type and reason)  Require Mod A for UE and LE       Transfers   Transfers  Sit to Stand;Stand to Sit;Stand Pivot Transfers;Squat Pivot  Transfers    Sit to Stand  2: Max assist    Sit to Stand Details (indicate cue type and reason)  require Mod-Max A depending upon height of surface.     Stand to Sit  3: Mod assist;With upper extremity assist    Stand to Sit Details (indicate cue type and reason)  Verbal cues for technique;Verbal cues for precautions/safety;Verbal cues for sequencing;Manual facilitation for weight shifting    Stand Pivot Transfers  3: Mod assist    Stand Pivot Transfer Details (indicate cue type and reason)  require guidance of walker, assist in maintaining upright position       Ambulation/Gait   Ambulation/Gait  -- 4 side step shuffles with Min A for upright posture              Objective measurements completed on examination: See above findings.              PT Education - 04/20/17 1613     Education provided  Yes    Education Details  POC, transfers, core stability     Person(s) Educated  Patient    Methods  Explanation;Demonstration;Verbal cues    Comprehension  Verbalized understanding;Returned demonstration       PT Short Term Goals - 04/20/17 1706      PT SHORT TERM GOAL #1   Title  Patient will be independent in home exercise program to improve strength/mobility for better functional independence with ADLs.    Time  2    Period  Weeks    Status  New    Target Date  05/04/17      PT SHORT TERM GOAL #2   Title  Patient will report a worst pain of 5/10 on VAS in low back to improve tolerance with ADLs and reduced symptoms with activities.     Baseline  7/10 sharp pain    Time  2    Period  Weeks    Status  New    Target Date  05/04/17        PT Long Term Goals - 04/20/17 1708      PT LONG TERM GOAL #1   Title  Patient will report a worst pain of 3/10 on VAS in low back to improve tolerance with ADLs and reduced symptoms with activities.     Baseline  7/10 pain    Time  4    Period  Weeks    Status  New    Target Date  05/18/17      PT LONG TERM GOAL #2   Title  Patient will be independant with self management of pain, posture and exercise to allow transition to home program     Baseline  patient has limited knowedge of appropriate exercise, progression without assistance and cuing    Time  4    Period  Weeks    Status  New    Target Date  05/18/17      PT LONG TERM GOAL #3   Title   Patient will reduce modified Oswestry score to <20 as to demonstrate minimal disability with ADLs including improved sleeping tolerance, walking/sitting tolerance etc for better mobility with ADLs.     Baseline  4/16: 66%    Time  4    Period  Weeks    Status  New    Target Date  05/18/17      PT LONG TERM GOAL #4   Title  Patient will increase lumbar extension  strength to at least 4/5 as to improve gross strength for sitting/standing tolerance with better erect  posture for increased tolerance with ADLs.     Baseline  2+/5     Time  4    Period  Weeks    Status  New    Target Date  05/18/17             Plan - 04/20/17 1617    Clinical Impression Statement  Patient is a pleasant 84 year old female who presents with low back pain. Pain has been present for 5+ years with patient having recent heart attack resulting in increased back pain. Patient challenged with definition/localization of pain. Requires Mod A for transfers due to LE weakness combined with core weakness and poor postural strength.  R L5 musculature/spinal process more tender with radiating symptoms. Patient challenged with task orientation often diverting to alternative stories from history. Patient would benefit from strengthening of core musculature and improving tissue extensibility of lumbar paraspinals for reduced pain and improved mobility.    History and Personal Factors relevant to plan of care:  This patient presents with 3, personal factors/ comorbidities, and  4  body elements including body structures and functions, activity limitations and or participation restrictions. Patient's condition is  evolving.    Clinical Presentation  Evolving    Clinical Presentation due to:  progressive weakness and limited mobility, recovering from NSTEMI, varying pain scale     Clinical Decision Making  Moderate    Rehab Potential  Fair    Clinical Impairments Affecting Rehab Potential  (+)motivated, family support (-) chronic condition, age, lymphedema left UE    PT Frequency  2x / week    PT Duration  4 weeks    PT Treatment/Interventions  Manual techniques;Patient/family education;Neuromuscular re-education;Therapeutic exercise;ADLs/Self Care Home Management;Cryotherapy;Electrical Stimulation;Iontophoresis 4mg /ml Dexamethasone;Moist Heat;Traction;Ultrasound;Functional mobility training;Therapeutic activities;Balance training;Passive range of motion;Energy conservation    PT Next Visit  Plan  HEP, core stability, stretch lumbar paraspinals     Consulted and Agree with Plan of Care  Patient       Patient will benefit from skilled therapeutic intervention in order to improve the following deficits and impairments:  Decreased strength, Pain, Impaired perceived functional ability, Increased muscle spasms, Decreased range of motion, Abnormal gait, Decreased activity tolerance, Decreased balance, Decreased mobility, Difficulty walking, Hypomobility, Postural dysfunction, Improper body mechanics  Visit Diagnosis: Chronic bilateral low back pain, with sciatica presence unspecified  Muscle weakness (generalized)     Problem List Patient Active Problem List   Diagnosis Date Noted  . NSTEMI (non-ST elevated myocardial infarction) ([place]) 02/04/2017  . Health care maintenance 07/30/2015  . Carcinoma of left breast ([place]) 05/26/2014  . Adult BMI 30+ 03/20/2014  . Morbid obesity ([place]) 03/20/2014  . Severe obesity (BMI 35.0-35.9 with comorbidity) ([place]) 03/20/2014  . Osteoarthritis of both knees 10/24/2013  . Focal lymphocytic colitis 10/11/2013  . Lymphocytic colitis 10/11/2013  . Impingement syndrome of shoulder 09/05/2013  . Impingement syndrome of left shoulder 09/05/2013  . Combined hyperlipidemia 07/08/2013  . HTN (hypertension), benign 07/08/2013  . Diabetes mellitus, type 2 ([place]) 07/08/2013  . Type 2 diabetes mellitus with stage 3 chronic kidney disease ([place]) 07/08/2013  . PA (pernicious anemia) 07/08/2013  . Osteoporosis 07/08/2013  . Tremor 07/08/2013  . Lumbar disc herniation 05/22/2013  . Lumbar paraspinal muscle spasm 05/22/2013  . Lumbar radiculitis 05/22/2013  . Lumbar stenosis with neurogenic claudication 05/22/2013   Janna Arch, PT, DPT   04/20/2017, 5:11 PM  [place] MAIN [facility] SERVICES [address] [place], [place], [address] Phone: [phone]   Fax:  [phone]  Name: Janet Elliott MRN: [id number] Date  of Birth: 12-07-1932

## 2017-04-27 ENCOUNTER — Ambulatory Visit: Payer: Medicare Other

## 2017-04-27 DIAGNOSIS — M545 Low back pain: Secondary | ICD-10-CM | POA: Diagnosis not present

## 2017-04-27 DIAGNOSIS — M6281 Muscle weakness (generalized): Secondary | ICD-10-CM

## 2017-04-27 DIAGNOSIS — G8929 Other chronic pain: Secondary | ICD-10-CM

## 2017-04-27 NOTE — Therapy (Signed)
[place] MAIN [facility] SERVICES [address] [place], [place], [address] Phone: [phone]   Fax:  [phone]  Physical Therapy Treatment  Patient Details  Name: Janet Elliott MRN: [id number] Date of Birth: January 23, 1932 Referring Provider: Sharlet Salina, MD   Encounter Date: 04/27/2017  PT End of Session - 04/27/17 1403    Visit Number  2    Number of Visits  8    Date for PT Re-Evaluation  05/18/17    Authorization Type  2/10    PT Start Time  1331    PT Stop Time  1420    PT Time Calculation (min)  49 min    Equipment Utilized During Treatment  Gait belt    Activity Tolerance  Patient limited by pain;Treatment limited secondary to medical complications (Comment)    Behavior During Therapy  [facility] for tasks assessed/performed difficulty to orient        Past Medical History:  Diagnosis Date  . Anxiety   . Arthritis    osteoarthritis  . Breast cancer ([place]) 2002   Left  chemo/radistion  . Carcinoma of left breast ([place])   . Lymphedema   . Sleep apnea     Past Surgical History:  Procedure Laterality Date  . BREAST BIOPSY Right 1980  . CORONARY ANGIOGRAPHY Left 02/05/2017   Procedure: CORONARY ANGIOGRAPHY;  Surgeon: Isaias Cowman, MD;  Location: [place] [facility];  Service: Cardiovascular;  Laterality: Left;  . CORONARY STENT INTERVENTION N/A 02/05/2017   Procedure: CORONARY STENT INTERVENTION;  Surgeon: Isaias Cowman, MD;  Location: [place] [facility];  Service: Cardiovascular;  Laterality: N/A;  . EYE SURGERY    . MASTECTOMY Left 2002    There were no vitals filed for this visit.  Subjective Assessment - 04/27/17 1338    Subjective  Patient reports having occasional back pain but is pain free today. Has not been too active since evaluation.     Pertinent History  84 year old female with past medical history significant for left breast cancer status post mastectomy, chemoradiation currently in remission, hypertension,  anxiety and sleep apnea presents with HNP (herniated nucleus pulposus) lumbar, lumbar stenosis with neurogenic claudication, and lumbar radiculitis.  Patient also has left arm lymphedema and uses a lymphedema pump and had NSTEMI on 02/04/17 resulting in left coronary angiography. Pt. has been followed by her doctor for chronic low-back pain for years with radiation into the bilateral buttock, lateral thigh and lateral aspect of the lower leg. Her symptoms have been more consistent with an L5 radiculitis. MRI dated 11/10/12 demonstrates at L5-S1 moderate degenerative disc disease and moderate bilateral foraminal stenosis. At L4-5 there is a central and left paracentral disc herniation with impingement of the left L4 nerve root in the lateral recess. There is severe central stenosis. Her pain reaches moderate to severe intensity prior to injections.    Limitations  Lifting;Sitting;Standing;Walking;House hold activities    How long can you sit comfortably?  5 minutes    How long can you stand comfortably?  unable to stand independently    How long can you walk comfortably?  unable to stand independently    Diagnostic tests  MRI: MRI dated 11/10/12 demonstrates at L5-S1 moderate degenerative disc disease and moderate bilateral foraminal stenosis. At L4-5 there is a central and left paracentral disc herniation with impingement of the left L4 nerve root in the lateral recess. There is severe central stenosis.    Patient Stated Goals  less pain, increased mobility  Currently in Pain?  No/denies         Access Code: [email or web address]  URL: [email or web address]  Date: 04/27/2017  Prepared by: Janna Arch   Exercises  . Supine Transversus Abdominis Bracing - Hands on Stomach - 10 reps - 2 sets - 5 hold - 1x daily - 7x weekly  . Hooklying Clamshell with Resistance - 10 reps - 2 sets - 5 hold - 1x daily - 7x weekly  . Hooklying Small March - 10 reps - 2 sets - 5 hold - 1x daily - 7x weekly  Hip  Adduction Isometric with Ball - 10 reSupine ps - 2 sets - 5 hold - 1x daily - 7x weekly    LE rotation for lumbar tissue extensibility 2 minutes  Heel slides 10x each side  SPT x2 with Mod A from [place] and Min A from plinth                   PT Education - 04/27/17 1403    Education provided  Yes    Education Details  HEP, core stability, exercise technique     Person(s) Educated  Patient    Methods  Explanation;Demonstration;Verbal cues    Comprehension  Verbalized understanding;Returned demonstration       PT Short Term Goals - 04/20/17 1706      PT SHORT TERM GOAL #1   Title  Patient will be independent in home exercise program to improve strength/mobility for better functional independence with ADLs.    Time  2    Period  Weeks    Status  New    Target Date  05/04/17      PT SHORT TERM GOAL #2   Title  Patient will report a worst pain of 5/10 on VAS in low back to improve tolerance with ADLs and reduced symptoms with activities.     Baseline  7/10 sharp pain    Time  2    Period  Weeks    Status  New    Target Date  05/04/17        PT Long Term Goals - 04/20/17 1708      PT LONG TERM GOAL #1   Title  Patient will report a worst pain of 3/10 on VAS in low back to improve tolerance with ADLs and reduced symptoms with activities.     Baseline  7/10 pain    Time  4    Period  Weeks    Status  New    Target Date  05/18/17      PT LONG TERM GOAL #2   Title  Patient will be independant with self management of pain, posture and exercise to allow transition to home program     Baseline  patient has limited knowedge of appropriate exercise, progression without assistance and cuing    Time  4    Period  Weeks    Status  New    Target Date  05/18/17      PT LONG TERM GOAL #3   Title   Patient will reduce modified Oswestry score to <20 as to demonstrate minimal disability with ADLs including improved sleeping tolerance, walking/sitting tolerance etc for  better mobility with ADLs.     Baseline  4/16: 66%    Time  4    Period  Weeks    Status  New    Target Date  05/18/17      PT LONG TERM GOAL #4  Title  Patient will increase lumbar extension strength to at least 4/5 as to improve gross strength for sitting/standing tolerance with better erect posture for increased tolerance with ADLs.     Baseline  2+/5     Time  4    Period  Weeks    Status  New    Target Date  05/18/17            Plan - 04/27/17 1427    Clinical Impression Statement  Patient educated on and demonstrated understanding of HEP program. LE heel slides challenging to patient due to demand upon LE strength and require modified lengthening. TrA contractions performed with tactile cueing. Patient will continue to benefit from skilled physical therapy to improve pain and mobility.     Rehab Potential  Fair    Clinical Impairments Affecting Rehab Potential  (+)motivated, family support (-) chronic condition, age, lymphedema left UE    PT Frequency  2x / week    PT Duration  4 weeks    PT Treatment/Interventions  Manual techniques;Patient/family education;Neuromuscular re-education;Therapeutic exercise;ADLs/Self Care Home Management;Cryotherapy;Electrical Stimulation;Iontophoresis 4mg /ml Dexamethasone;Moist Heat;Traction;Ultrasound;Functional mobility training;Therapeutic activities;Balance training;Passive range of motion;Energy conservation    PT Next Visit Plan  HEP, core stability, stretch lumbar paraspinals     Consulted and Agree with Plan of Care  Patient       Patient will benefit from skilled therapeutic intervention in order to improve the following deficits and impairments:  Decreased strength, Pain, Impaired perceived functional ability, Increased muscle spasms, Decreased range of motion, Abnormal gait, Decreased activity tolerance, Decreased balance, Decreased mobility, Difficulty walking, Hypomobility, Postural dysfunction, Improper body mechanics  Visit  Diagnosis: Chronic bilateral low back pain, with sciatica presence unspecified  Muscle weakness (generalized)     Problem List Patient Active Problem List   Diagnosis Date Noted  . NSTEMI (non-ST elevated myocardial infarction) ([place]) 02/04/2017  . Health care maintenance 07/30/2015  . Carcinoma of left breast ([place]) 05/26/2014  . Adult BMI 30+ 03/20/2014  . Morbid obesity ([place]) 03/20/2014  . Severe obesity (BMI 35.0-35.9 with comorbidity) ([place]) 03/20/2014  . Osteoarthritis of both knees 10/24/2013  . Focal lymphocytic colitis 10/11/2013  . Lymphocytic colitis 10/11/2013  . Impingement syndrome of shoulder 09/05/2013  . Impingement syndrome of left shoulder 09/05/2013  . Combined hyperlipidemia 07/08/2013  . HTN (hypertension), benign 07/08/2013  . Diabetes mellitus, type 2 ([place]) 07/08/2013  . Type 2 diabetes mellitus with stage 3 chronic kidney disease ([place]) 07/08/2013  . PA (pernicious anemia) 07/08/2013  . Osteoporosis 07/08/2013  . Tremor 07/08/2013  . Lumbar disc herniation 05/22/2013  . Lumbar paraspinal muscle spasm 05/22/2013  . Lumbar radiculitis 05/22/2013  . Lumbar stenosis with neurogenic claudication 05/22/2013   Janna Arch, PT, DPT   04/27/2017, 2:29 PM  [place] MAIN [facility] SERVICES [address] [place], [place], [address] Phone: [phone]   Fax:  [phone]  Name: Janet Elliott MRN: [id number] Date of Birth: Jan 02, 1933

## 2017-04-27 NOTE — Patient Instructions (Signed)
Access Code: [email or web address]  URL: [email or web address]  Date: 04/27/2017  Prepared by: Janna Arch   Exercises  . Supine Transversus Abdominis Bracing - Hands on Stomach - 10 reps - 2 sets - 5 hold - 1x daily - 7x weekly  . Hooklying Clamshell with Resistance - 10 reps - 2 sets - 5 hold - 1x daily - 7x weekly  . Hooklying Small March - 10 reps - 2 sets - 5 hold - 1x daily - 7x weekly  . Supine Hip Adduction Isometric with Ball - 10 reps - 2 sets - 5 hold - 1x daily - 7x weekly

## 2017-04-29 ENCOUNTER — Ambulatory Visit: Payer: Medicare Other

## 2017-04-29 DIAGNOSIS — M545 Low back pain: Principal | ICD-10-CM

## 2017-04-29 DIAGNOSIS — G8929 Other chronic pain: Secondary | ICD-10-CM

## 2017-04-29 DIAGNOSIS — M6281 Muscle weakness (generalized): Secondary | ICD-10-CM

## 2017-04-29 NOTE — Therapy (Signed)
[place] MAIN [facility] SERVICES [address] [place], [place], [address] Phone: [phone]   Fax:  [phone]  Physical Therapy Treatment  Patient Details  Name: Janet Elliott MRN: [id number] Date of Birth: 06-09-1932 Referring Provider: Sharlet Salina, MD   Encounter Date: 04/29/2017  PT End of Session - 04/29/17 1514    Visit Number  3    Number of Visits  8    Date for PT Re-Evaluation  05/18/17    Authorization Type  3/10    PT Start Time  3419    PT Stop Time  1430    PT Time Calculation (min)  45 min    Equipment Utilized During Treatment  Gait belt    Activity Tolerance  Patient limited by pain;Treatment limited secondary to medical complications (Comment)    Behavior During Therapy  [facility] for tasks assessed/performed difficulty to orient        Past Medical History:  Diagnosis Date  . Anxiety   . Arthritis    osteoarthritis  . Breast cancer ([place]) 2002   Left  chemo/radistion  . Carcinoma of left breast ([place])   . Lymphedema   . Sleep apnea     Past Surgical History:  Procedure Laterality Date  . BREAST BIOPSY Right 1980  . CORONARY ANGIOGRAPHY Left 02/05/2017   Procedure: CORONARY ANGIOGRAPHY;  Surgeon: Isaias Cowman, MD;  Location: [facility];  Service: Cardiovascular;  Laterality: Left;  . CORONARY STENT INTERVENTION N/A 02/05/2017   Procedure: CORONARY STENT INTERVENTION;  Surgeon: Isaias Cowman, MD;  Location: [place] [facility];  Service: Cardiovascular;  Laterality: N/A;  . EYE SURGERY    . MASTECTOMY Left 2002    There were no vitals filed for this visit.  Subjective Assessment - 04/29/17 1352    Subjective  [name] reports doing her new HEP. Having some knee pain.     Pertinent History  84 year old female with past medical history significant for left breast cancer status post mastectomy, chemoradiation currently in remission, hypertension, anxiety and sleep apnea presents with HNP (herniated  nucleus pulposus) lumbar, lumbar stenosis with neurogenic claudication, and lumbar radiculitis.  Patient also has left arm lymphedema and uses a lymphedema pump and had NSTEMI on 02/04/17 resulting in left coronary angiography. Pt. has been followed by her doctor for chronic low-back pain for years with radiation into the bilateral buttock, lateral thigh and lateral aspect of the lower leg. Her symptoms have been more consistent with an L5 radiculitis. MRI dated 11/10/12 demonstrates at L5-S1 moderate degenerative disc disease and moderate bilateral foraminal stenosis. At L4-5 there is a central and left paracentral disc herniation with impingement of the left L4 nerve root in the lateral recess. There is severe central stenosis. Her pain reaches moderate to severe intensity prior to injections.    Limitations  Lifting;Sitting;Standing;Walking;House hold activities    How long can you sit comfortably?  5 minutes    How long can you stand comfortably?  unable to stand independently    How long can you walk comfortably?  unable to stand independently    Diagnostic tests  MRI: MRI dated 11/10/12 demonstrates at L5-S1 moderate degenerative disc disease and moderate bilateral foraminal stenosis. At L4-5 there is a central and left paracentral disc herniation with impingement of the left L4 nerve root in the lateral recess. There is severe central stenosis.    Patient Stated Goals  less pain, increased mobility    Currently in Pain?  Yes  Pain Score  2     Pain Location  Knee    Pain Orientation  Right;Left    Pain Descriptors / Indicators  Aching    Pain Type  Acute pain         Exercises   Supine Transversus Abdominis Bracing - Hands on Stomach - 10 reps - 2 sets - 5 hold - 1x daily - 7x weekly   Hooklying Clamshell with Resistance - 10 reps - 2 sets - 5 hold - 1x daily - 7x weekly   Hooklying Small March 20x GTB    Hip Adduction Isometric with Ball - 10 re   LE rotation for lumbar tissue  extensibility 2 minutes  SLR 8x each LE  5x STS from raised table with hands on thighs CGA.   Supine hamstring stretch 2x60 bilaterl LE  Supine Popliteal angle stretch 2x60 bilat LE     Heel slides 10x each side    Pt. response to medical necessity:  Patient will continue to benefit from skilled physical therapy to improve pain and mobility.                     PT Education - 04/29/17 1514    Education provided  Yes    Education Details  exercise technique, core stability     Person(s) Educated  Patient    Methods  Explanation;Demonstration;Verbal cues    Comprehension  Verbalized understanding;Returned demonstration       PT Short Term Goals - 04/20/17 1706      PT SHORT TERM GOAL #1   Title  Patient will be independent in home exercise program to improve strength/mobility for better functional independence with ADLs.    Time  2    Period  Weeks    Status  New    Target Date  05/04/17      PT SHORT TERM GOAL #2   Title  Patient will report a worst pain of 5/10 on VAS in low back to improve tolerance with ADLs and reduced symptoms with activities.     Baseline  7/10 sharp pain    Time  2    Period  Weeks    Status  New    Target Date  05/04/17        PT Long Term Goals - 04/20/17 1708      PT LONG TERM GOAL #1   Title  Patient will report a worst pain of 3/10 on VAS in low back to improve tolerance with ADLs and reduced symptoms with activities.     Baseline  7/10 pain    Time  4    Period  Weeks    Status  New    Target Date  05/18/17      PT LONG TERM GOAL #2   Title  Patient will be independant with self management of pain, posture and exercise to allow transition to home program     Baseline  patient has limited knowedge of appropriate exercise, progression without assistance and cuing    Time  4    Period  Weeks    Status  New    Target Date  05/18/17      PT LONG TERM GOAL #3   Title   Patient will reduce modified Oswestry score  to <20 as to demonstrate minimal disability with ADLs including improved sleeping tolerance, walking/sitting tolerance etc for better mobility with ADLs.     Baseline  4/16: 66%  Time  4    Period  Weeks    Status  New    Target Date  05/18/17      PT LONG TERM GOAL #4   Title  Patient will increase lumbar extension strength to at least 4/5 as to improve gross strength for sitting/standing tolerance with better erect posture for increased tolerance with ADLs.     Baseline  2+/5     Time  4    Period  Weeks    Status  New    Target Date  05/18/17            Plan - 04/29/17 1515    Clinical Impression Statement   Patient demonstrates understanding of HEP with minimal corrections needed. Patient challenged with sit to stand transfer with multiple repetitions due to fatigue. Weak LE combined with weak core musculature results in poor postural control and back pain. Patient will continue to benefit from skilled physical therapy to improve pain and mobility.     Rehab Potential  Fair    Clinical Impairments Affecting Rehab Potential  (+)motivated, family support (-) chronic condition, age, lymphedema left UE    PT Frequency  2x / week    PT Duration  4 weeks    PT Treatment/Interventions  Manual techniques;Patient/family education;Neuromuscular re-education;Therapeutic exercise;ADLs/Self Care Home Management;Cryotherapy;Electrical Stimulation;Iontophoresis 4mg /ml Dexamethasone;Moist Heat;Traction;Ultrasound;Functional mobility training;Therapeutic activities;Balance training;Passive range of motion;Energy conservation    PT Next Visit Plan  HEP, core stability, stretch lumbar paraspinals     Consulted and Agree with Plan of Care  Patient       Patient will benefit from skilled therapeutic intervention in order to improve the following deficits and impairments:  Decreased strength, Pain, Impaired perceived functional ability, Increased muscle spasms, Decreased range of motion, Abnormal  gait, Decreased activity tolerance, Decreased balance, Decreased mobility, Difficulty walking, Hypomobility, Postural dysfunction, Improper body mechanics  Visit Diagnosis: Chronic bilateral low back pain, with sciatica presence unspecified  Muscle weakness (generalized)     Problem List Patient Active Problem List   Diagnosis Date Noted  . NSTEMI (non-ST elevated myocardial infarction) ([place]) 02/04/2017  . Health care maintenance 07/30/2015  . Carcinoma of left breast ([place]) 05/26/2014  . Adult BMI 30+ 03/20/2014  . Morbid obesity ([place]) 03/20/2014  . Severe obesity (BMI 35.0-35.9 with comorbidity) ([place]) 03/20/2014  . Osteoarthritis of both knees 10/24/2013  . Focal lymphocytic colitis 10/11/2013  . Lymphocytic colitis 10/11/2013  . Impingement syndrome of shoulder 09/05/2013  . Impingement syndrome of left shoulder 09/05/2013  . Combined hyperlipidemia 07/08/2013  . HTN (hypertension), benign 07/08/2013  . Diabetes mellitus, type 2 ([place]) 07/08/2013  . Type 2 diabetes mellitus with stage 3 chronic kidney disease ([place]) 07/08/2013  . PA (pernicious anemia) 07/08/2013  . Osteoporosis 07/08/2013  . Tremor 07/08/2013  . Lumbar disc herniation 05/22/2013  . Lumbar paraspinal muscle spasm 05/22/2013  . Lumbar radiculitis 05/22/2013  . Lumbar stenosis with neurogenic claudication 05/22/2013   Janna Arch, PT, DPT   04/29/2017, 3:16 PM  [place] MAIN [facility] SERVICES [address] [place], [place], [address] Phone: [phone]   Fax:  [phone]  Name: Janet Elliott MRN: [id number] Date of Birth: 1932/07/08

## 2017-05-04 ENCOUNTER — Ambulatory Visit: Payer: Medicare Other

## 2017-05-04 DIAGNOSIS — M6281 Muscle weakness (generalized): Secondary | ICD-10-CM

## 2017-05-04 DIAGNOSIS — M545 Low back pain: Secondary | ICD-10-CM | POA: Diagnosis not present

## 2017-05-04 DIAGNOSIS — G8929 Other chronic pain: Secondary | ICD-10-CM

## 2017-05-04 NOTE — Therapy (Signed)
[place] MAIN [facility] SERVICES [address] [place], [place], [address] Phone: [phone]   Fax:  [phone]  Physical Therapy Treatment  Patient Details  Name: Janet Elliott MRN: [id number] Date of Birth: 1932/11/26 Referring Provider: Sharlet Salina, MD   Encounter Date: 05/04/2017  PT End of Session - 05/04/17 1355    Visit Number  4    Number of Visits  8    Date for PT Re-Evaluation  05/18/17    Authorization Type  4/10    PT Start Time  1344    PT Stop Time  1429    PT Time Calculation (min)  45 min    Equipment Utilized During Treatment  Gait belt    Activity Tolerance  Patient limited by pain;Treatment limited secondary to medical complications (Comment)    Behavior During Therapy  [facility] for tasks assessed/performed difficulty to orient        Past Medical History:  Diagnosis Date  . Anxiety   . Arthritis    osteoarthritis  . Breast cancer ([place]) 2002   Left  chemo/radistion  . Carcinoma of left breast ([place])   . Lymphedema   . Sleep apnea     Past Surgical History:  Procedure Laterality Date  . BREAST BIOPSY Right 1980  . CORONARY ANGIOGRAPHY Left 02/05/2017   Procedure: CORONARY ANGIOGRAPHY;  Surgeon: Isaias Cowman, MD;  Location: [place] [facility];  Service: Cardiovascular;  Laterality: Left;  . CORONARY STENT INTERVENTION N/A 02/05/2017   Procedure: CORONARY STENT INTERVENTION;  Surgeon: Isaias Cowman, MD;  Location: [place] [facility];  Service: Cardiovascular;  Laterality: N/A;  . EYE SURGERY    . MASTECTOMY Left 2002    There were no vitals filed for this visit.  Subjective Assessment - 05/04/17 1353    Subjective  Patient reports fall this  morning sliding out of shower chair in shower. Had to call EMS to get her up after 2 hours. Can't feeel any bruising yet but fatigued     Pertinent History  84 year old female with past medical history significant for left breast cancer status post mastectomy,  chemoradiation currently in remission, hypertension, anxiety and sleep apnea presents with HNP (herniated nucleus pulposus) lumbar, lumbar stenosis with neurogenic claudication, and lumbar radiculitis.  Patient also has left arm lymphedema and uses a lymphedema pump and had NSTEMI on 02/04/17 resulting in left coronary angiography. Pt. has been followed by her doctor for chronic low-back pain for years with radiation into the bilateral buttock, lateral thigh and lateral aspect of the lower leg. Her symptoms have been more consistent with an L5 radiculitis. MRI dated 11/10/12 demonstrates at L5-S1 moderate degenerative disc disease and moderate bilateral foraminal stenosis. At L4-5 there is a central and left paracentral disc herniation with impingement of the left L4 nerve root in the lateral recess. There is severe central stenosis. Her pain reaches moderate to severe intensity prior to injections.    Limitations  Lifting;Sitting;Standing;Walking;House hold activities    How long can you sit comfortably?  5 minutes    How long can you stand comfortably?  unable to stand independently    How long can you walk comfortably?  unable to stand independently    Diagnostic tests  MRI: MRI dated 11/10/12 demonstrates at L5-S1 moderate degenerative disc disease and moderate bilateral foraminal stenosis. At L4-5 there is a central and left paracentral disc herniation with impingement of the left L4 nerve root in the lateral recess. There is severe  central stenosis.    Patient Stated Goals  less pain, increased mobility    Currently in Pain?  No/denies     SPT with Mod A  LE rotation for lumbar tissue extensibility 2 minutes  TrA contraction with overhead bar 2 lb hold and heel slides 10x each leg    SLR 10x each LE  Bridge 10x with tactile cueing/stabilization at feet   2lb ankle weights seated  LAQ with 3 second holds, cues for upright posture 10x each LE 2 sets   Marches 10x; 2 sets     Seated adduction  squeeze 10x 3 seconds , 2 sets  Seated ankle alphabet spelling BLE    Seated abduction 2x10   Pt. response to medical necessity:  Patient will continue to benefit from skilled physical therapy to improve pain and mobility.                          PT Education - 05/04/17 1355    Education provided  Yes    Education Details  exercise technique, core stability     Person(s) Educated  Patient    Methods  Explanation;Demonstration;Verbal cues    Comprehension  Verbalized understanding;Returned demonstration;Need further instruction       PT Short Term Goals - 04/20/17 1706      PT SHORT TERM GOAL #1   Title  Patient will be independent in home exercise program to improve strength/mobility for better functional independence with ADLs.    Time  2    Period  Weeks    Status  New    Target Date  05/04/17      PT SHORT TERM GOAL #2   Title  Patient will report a worst pain of 5/10 on VAS in low back to improve tolerance with ADLs and reduced symptoms with activities.     Baseline  7/10 sharp pain    Time  2    Period  Weeks    Status  New    Target Date  05/04/17        PT Long Term Goals - 04/20/17 1708      PT LONG TERM GOAL #1   Title  Patient will report a worst pain of 3/10 on VAS in low back to improve tolerance with ADLs and reduced symptoms with activities.     Baseline  7/10 pain    Time  4    Period  Weeks    Status  New    Target Date  05/18/17      PT LONG TERM GOAL #2   Title  Patient will be independant with self management of pain, posture and exercise to allow transition to home program     Baseline  patient has limited knowedge of appropriate exercise, progression without assistance and cuing    Time  4    Period  Weeks    Status  New    Target Date  05/18/17      PT LONG TERM GOAL #3   Title   Patient will reduce modified Oswestry score to <20 as to demonstrate minimal disability with ADLs including improved sleeping tolerance,  walking/sitting tolerance etc for better mobility with ADLs.     Baseline  4/16: 66%    Time  4    Period  Weeks    Status  New    Target Date  05/18/17      PT LONG TERM GOAL #4  Title  Patient will increase lumbar extension strength to at least 4/5 as to improve gross strength for sitting/standing tolerance with better erect posture for increased tolerance with ADLs.     Baseline  2+/5     Time  4    Period  Weeks    Status  New    Target Date  05/18/17            Plan - 05/04/17 1359    Clinical Impression Statement  Patient limited in session today due to fatigue and exhaustion from fall this morning in shower. Unable to perform standing independently today due to bilateral knee buckling.  Patient will continue to benefit from skilled physical therapy to improve pain and mobility.    Rehab Potential  Fair    Clinical Impairments Affecting Rehab Potential  (+)motivated, family support (-) chronic condition, age, lymphedema left UE    PT Frequency  2x / week    PT Duration  4 weeks    PT Treatment/Interventions  Manual techniques;Patient/family education;Neuromuscular re-education;Therapeutic exercise;ADLs/Self Care Home Management;Cryotherapy;Electrical Stimulation;Iontophoresis 4mg /ml Dexamethasone;Moist Heat;Traction;Ultrasound;Functional mobility training;Therapeutic activities;Balance training;Passive range of motion;Energy conservation    PT Next Visit Plan  HEP, core stability, stretch lumbar paraspinals     Consulted and Agree with Plan of Care  Patient       Patient will benefit from skilled therapeutic intervention in order to improve the following deficits and impairments:  Decreased strength, Pain, Impaired perceived functional ability, Increased muscle spasms, Decreased range of motion, Abnormal gait, Decreased activity tolerance, Decreased balance, Decreased mobility, Difficulty walking, Hypomobility, Postural dysfunction, Improper body mechanics  Visit  Diagnosis: Chronic bilateral low back pain, with sciatica presence unspecified  Muscle weakness (generalized)     Problem List Patient Active Problem List   Diagnosis Date Noted  . NSTEMI (non-ST elevated myocardial infarction) ([place]) 02/04/2017  . Health care maintenance 07/30/2015  . Carcinoma of left breast ([place]) 05/26/2014  . Adult BMI 30+ 03/20/2014  . Morbid obesity ([place]) 03/20/2014  . Severe obesity (BMI 35.0-35.9 with comorbidity) ([place]) 03/20/2014  . Osteoarthritis of both knees 10/24/2013  . Focal lymphocytic colitis 10/11/2013  . Lymphocytic colitis 10/11/2013  . Impingement syndrome of shoulder 09/05/2013  . Impingement syndrome of left shoulder 09/05/2013  . Combined hyperlipidemia 07/08/2013  . HTN (hypertension), benign 07/08/2013  . Diabetes mellitus, type 2 ([place]) 07/08/2013  . Type 2 diabetes mellitus with stage 3 chronic kidney disease ([place]) 07/08/2013  . PA (pernicious anemia) 07/08/2013  . Osteoporosis 07/08/2013  . Tremor 07/08/2013  . Lumbar disc herniation 05/22/2013  . Lumbar paraspinal muscle spasm 05/22/2013  . Lumbar radiculitis 05/22/2013  . Lumbar stenosis with neurogenic claudication 05/22/2013   Janna Arch, PT, DPT   05/04/2017, 2:32 PM  [place] MAIN [facility] SERVICES [address] [place], [place], [address] Phone: [phone]   Fax:  [phone]  Name: CLARKE AMBURN MRN: [id number] Date of Birth: [birth year]

## 2017-05-06 ENCOUNTER — Ambulatory Visit: Payer: Medicare Other | Attending: Physical Medicine and Rehabilitation

## 2017-05-06 DIAGNOSIS — M25512 Pain in left shoulder: Secondary | ICD-10-CM | POA: Insufficient documentation

## 2017-05-06 DIAGNOSIS — M545 Low back pain: Secondary | ICD-10-CM | POA: Diagnosis present

## 2017-05-06 DIAGNOSIS — R42 Dizziness and giddiness: Secondary | ICD-10-CM | POA: Diagnosis present

## 2017-05-06 DIAGNOSIS — R262 Difficulty in walking, not elsewhere classified: Secondary | ICD-10-CM | POA: Diagnosis present

## 2017-05-06 DIAGNOSIS — M6281 Muscle weakness (generalized): Secondary | ICD-10-CM | POA: Diagnosis present

## 2017-05-06 DIAGNOSIS — G8929 Other chronic pain: Secondary | ICD-10-CM

## 2017-05-06 NOTE — Therapy (Signed)
[place] MAIN [facility] SERVICES [address] [place], [place], [address] Phone: [phone]   Fax:  [phone]  Physical Therapy Treatment  Patient Details  Name: Janet Elliott MRN: [id number] Date of Birth: May 04, 1932 Referring Provider: Sharlet Salina, MD   Encounter Date: 05/06/2017  PT End of Session - 05/06/17 1355    Visit Number  5    Number of Visits  8    Date for PT Re-Evaluation  05/18/17    Authorization Type  5/10    PT Start Time  6269    PT Stop Time  1430    PT Time Calculation (min)  45 min    Equipment Utilized During Treatment  Gait belt    Activity Tolerance  Patient limited by pain;Treatment limited secondary to medical complications (Comment)    Behavior During Therapy  [facility] for tasks assessed/performed difficulty to orient        Past Medical History:  Diagnosis Date  . Anxiety   . Arthritis    osteoarthritis  . Breast cancer ([place]) 2002   Left  chemo/radistion  . Carcinoma of left breast ([place])   . Lymphedema   . Sleep apnea     Past Surgical History:  Procedure Laterality Date  . BREAST BIOPSY Right 1980  . CORONARY ANGIOGRAPHY Left 02/05/2017   Procedure: CORONARY ANGIOGRAPHY;  Surgeon: Isaias Cowman, MD;  Location: [place] [facility];  Service: Cardiovascular;  Laterality: Left;  . CORONARY STENT INTERVENTION N/A 02/05/2017   Procedure: CORONARY STENT INTERVENTION;  Surgeon: Isaias Cowman, MD;  Location: [place] [facility];  Service: Cardiovascular;  Laterality: N/A;  . EYE SURGERY    . MASTECTOMY Left 2002    There were no vitals filed for this visit.  Subjective Assessment - 05/06/17 1353    Subjective  Patient reports compliance with HEP. Patient reports not feeling too stiff since her fall earlier this week.     Pertinent History  84 year old female with past medical history significant for left breast cancer status post mastectomy, chemoradiation currently in remission, hypertension, anxiety  and sleep apnea presents with HNP (herniated nucleus pulposus) lumbar, lumbar stenosis with neurogenic claudication, and lumbar radiculitis.  Patient also has left arm lymphedema and uses a lymphedema pump and had NSTEMI on 02/04/17 resulting in left coronary angiography. Pt. has been followed by her doctor for chronic low-back pain for years with radiation into the bilateral buttock, lateral thigh and lateral aspect of the lower leg. Her symptoms have been more consistent with an L5 radiculitis. MRI dated 11/10/12 demonstrates at L5-S1 moderate degenerative disc disease and moderate bilateral foraminal stenosis. At L4-5 there is a central and left paracentral disc herniation with impingement of the left L4 nerve root in the lateral recess. There is severe central stenosis. Her pain reaches moderate to severe intensity prior to injections.    Limitations  Lifting;Sitting;Standing;Walking;House hold activities    How long can you sit comfortably?  5 minutes    How long can you stand comfortably?  unable to stand independently    How long can you walk comfortably?  unable to stand independently    Diagnostic tests  MRI: MRI dated 11/10/12 demonstrates at L5-S1 moderate degenerative disc disease and moderate bilateral foraminal stenosis. At L4-5 there is a central and left paracentral disc herniation with impingement of the left L4 nerve root in the lateral recess. There is severe central stenosis.    Patient Stated Goals  less pain, increased mobility  Currently in Pain?  No/denies      Nustep Lvl 1 3 minutes >60 rpm for improved cardiovascular    Standing march in // bars 10x BUE support , good clearance of BLE.     SPT with CGA  Ambulate 8 ft with RW and CGA; poor clearance of BLE last 3 ft due to fatigue.   Ambulate one length of // bars with BUE support and CGA  LE rotation for lumbar tissue extensibility 2 minutes   SLR 10x each LE   Bridge 10x with tactile cueing/stabilization at feet    2lb ankle weights seated             LAQ with 3 second holds, cues for upright posture 10x each LE 2 sets              Marches 10x; 2 sets     Seated adduction squeeze 10x 3 seconds ,     Pt. response to medical necessity:  Patient will continue to benefit from skilled physical therapy to improve pain and mobility.                      PT Education - 05/06/17 1354    Education provided  Yes    Education Details  exercise technique,    Person(s) Educated  Patient    Methods  Explanation;Demonstration;Verbal cues    Comprehension  Verbalized understanding;Returned demonstration       PT Short Term Goals - 04/20/17 1706      PT SHORT TERM GOAL #1   Title  Patient will be independent in home exercise program to improve strength/mobility for better functional independence with ADLs.    Time  2    Period  Weeks    Status  New    Target Date  05/04/17      PT SHORT TERM GOAL #2   Title  Patient will report a worst pain of 5/10 on VAS in low back to improve tolerance with ADLs and reduced symptoms with activities.     Baseline  7/10 sharp pain    Time  2    Period  Weeks    Status  New    Target Date  05/04/17        PT Long Term Goals - 04/20/17 1708      PT LONG TERM GOAL #1   Title  Patient will report a worst pain of 3/10 on VAS in low back to improve tolerance with ADLs and reduced symptoms with activities.     Baseline  7/10 pain    Time  4    Period  Weeks    Status  New    Target Date  05/18/17      PT LONG TERM GOAL #2   Title  Patient will be independant with self management of pain, posture and exercise to allow transition to home program     Baseline  patient has limited knowedge of appropriate exercise, progression without assistance and cuing    Time  4    Period  Weeks    Status  New    Target Date  05/18/17      PT LONG TERM GOAL #3   Title   Patient will reduce modified Oswestry score to <20 as to demonstrate minimal disability  with ADLs including improved sleeping tolerance, walking/sitting tolerance etc for better mobility with ADLs.     Baseline  4/16: 66%    Time  4    Period  Weeks    Status  New    Target Date  05/18/17      PT LONG TERM GOAL #4   Title  Patient will increase lumbar extension strength to at least 4/5 as to improve gross strength for sitting/standing tolerance with better erect posture for increased tolerance with ADLs.     Baseline  2+/5     Time  4    Period  Weeks    Status  New    Target Date  05/18/17            Plan - 05/06/17 1621    Clinical Impression Statement  Patient demonstrates improved stability and strength allowing patient to perform short duration standing interventions with rest between. Patient fatigues quickly with exercises requiring rest breaks at this time.  Patient will continue to benefit from skilled physical therapy to improve pain and mobility.     Rehab Potential  Fair    Clinical Impairments Affecting Rehab Potential  (+)motivated, family support (-) chronic condition, age, lymphedema left UE    PT Frequency  2x / week    PT Duration  4 weeks    PT Treatment/Interventions  Manual techniques;Patient/family education;Neuromuscular re-education;Therapeutic exercise;ADLs/Self Care Home Management;Cryotherapy;Electrical Stimulation;Iontophoresis 4mg /ml Dexamethasone;Moist Heat;Traction;Ultrasound;Functional mobility training;Therapeutic activities;Balance training;Passive range of motion;Energy conservation    PT Next Visit Plan  HEP, core stability, stretch lumbar paraspinals     Consulted and Agree with Plan of Care  Patient       Patient will benefit from skilled therapeutic intervention in order to improve the following deficits and impairments:  Decreased strength, Pain, Impaired perceived functional ability, Increased muscle spasms, Decreased range of motion, Abnormal gait, Decreased activity tolerance, Decreased balance, Decreased mobility, Difficulty  walking, Hypomobility, Postural dysfunction, Improper body mechanics  Visit Diagnosis: Chronic bilateral low back pain, with sciatica presence unspecified  Muscle weakness (generalized)     Problem List Patient Active Problem List   Diagnosis Date Noted  . NSTEMI (non-ST elevated myocardial infarction) ([place]) 02/04/2017  . Health care maintenance 07/30/2015  . Carcinoma of left breast ([place]) 05/26/2014  . Adult BMI 30+ 03/20/2014  . Morbid obesity ([place]) 03/20/2014  . Severe obesity (BMI 35.0-35.9 with comorbidity) ([place]) 03/20/2014  . Osteoarthritis of both knees 10/24/2013  . Focal lymphocytic colitis 10/11/2013  . Lymphocytic colitis 10/11/2013  . Impingement syndrome of shoulder 09/05/2013  . Impingement syndrome of left shoulder 09/05/2013  . Combined hyperlipidemia 07/08/2013  . HTN (hypertension), benign 07/08/2013  . Diabetes mellitus, type 2 ([place]) 07/08/2013  . Type 2 diabetes mellitus with stage 3 chronic kidney disease ([place]) 07/08/2013  . PA (pernicious anemia) 07/08/2013  . Osteoporosis 07/08/2013  . Tremor 07/08/2013  . Lumbar disc herniation 05/22/2013  . Lumbar paraspinal muscle spasm 05/22/2013  . Lumbar radiculitis 05/22/2013  . Lumbar stenosis with neurogenic claudication 05/22/2013   Janna Arch, PT, DPT   05/06/2017, 4:23 PM  [place] MAIN [facility] SERVICES [address] [place], [place], [address] Phone: [phone]   Fax:  [phone]  Name: Janet Elliott MRN: [id number] Date of Birth: 11-Mar-1932

## 2017-05-11 ENCOUNTER — Encounter: Payer: Self-pay | Admitting: Physical Therapy

## 2017-05-11 ENCOUNTER — Ambulatory Visit: Payer: Medicare Other | Admitting: Physical Therapy

## 2017-05-11 DIAGNOSIS — R262 Difficulty in walking, not elsewhere classified: Secondary | ICD-10-CM

## 2017-05-11 DIAGNOSIS — M545 Low back pain: Secondary | ICD-10-CM | POA: Diagnosis not present

## 2017-05-11 DIAGNOSIS — G8929 Other chronic pain: Secondary | ICD-10-CM

## 2017-05-11 DIAGNOSIS — M25512 Pain in left shoulder: Secondary | ICD-10-CM

## 2017-05-11 DIAGNOSIS — R42 Dizziness and giddiness: Secondary | ICD-10-CM

## 2017-05-11 DIAGNOSIS — M6281 Muscle weakness (generalized): Secondary | ICD-10-CM

## 2017-05-11 NOTE — Therapy (Signed)
[place] MAIN [facility] SERVICES [place], [place], [address] Phone: [phone]   Fax:  [phone]  Physical Therapy Treatment  Patient Details  Name: Janet Elliott MRN: [id number] Date of Birth: 01-30-1932 Referring Provider: Sharlet Salina, MD   Encounter Date: 05/11/2017  PT End of Session - 05/11/17 1356    Visit Number  6    Number of Visits  8    Date for PT Re-Evaluation  05/18/17    Authorization Type  6/10    PT Start Time  0145    PT Stop Time  0225    PT Time Calculation (min)  40 min    Equipment Utilized During Treatment  Gait belt    Activity Tolerance  Patient limited by pain;Treatment limited secondary to medical complications (Comment)    Behavior During Therapy  [facility] for tasks assessed/performed difficulty to orient        Past Medical History:  Diagnosis Date  . Anxiety   . Arthritis    osteoarthritis  . Breast cancer ([place]) 2002   Left  chemo/radistion  . Carcinoma of left breast ([place])   . Lymphedema   . Sleep apnea     Past Surgical History:  Procedure Laterality Date  . BREAST BIOPSY Right 1980  . CORONARY ANGIOGRAPHY Left 02/05/2017   Procedure: CORONARY ANGIOGRAPHY;  Surgeon: Isaias Cowman, MD;  Location: [place] [facility];  Service: Cardiovascular;  Laterality: Left;  . CORONARY STENT INTERVENTION N/A 02/05/2017   Procedure: CORONARY STENT INTERVENTION;  Surgeon: Isaias Cowman, MD;  Location: [place] [facility];  Service: Cardiovascular;  Laterality: N/A;  . EYE SURGERY    . MASTECTOMY Left 2002    There were no vitals filed for this visit.  Subjective Assessment - 05/11/17 1354    Subjective  Patient reports compliance with HEP. Patient reports not feeling too stiff since her fall earlier this week.     Pertinent History  84 year old female with past medical history significant for left breast cancer status post mastectomy, chemoradiation currently in remission, hypertension, anxiety  and sleep apnea presents with HNP (herniated nucleus pulposus) lumbar, lumbar stenosis with neurogenic claudication, and lumbar radiculitis.  Patient also has left arm lymphedema and uses a lymphedema pump and had NSTEMI on 02/04/17 resulting in left coronary angiography. Pt. has been followed by her doctor for chronic low-back pain for years with radiation into the bilateral buttock, lateral thigh and lateral aspect of the lower leg. Her symptoms have been more consistent with an L5 radiculitis. MRI dated 11/10/12 demonstrates at L5-S1 moderate degenerative disc disease and moderate bilateral foraminal stenosis. At L4-5 there is a central and left paracentral disc herniation with impingement of the left L4 nerve root in the lateral recess. There is severe central stenosis. Her pain reaches moderate to severe intensity prior to injections.    Limitations  Lifting;Sitting;Standing;Walking;House hold activities    How long can you sit comfortably?  5 minutes    How long can you stand comfortably?  unable to stand independently    How long can you walk comfortably?  unable to stand independently    Diagnostic tests  MRI: MRI dated 11/10/12 demonstrates at L5-S1 moderate degenerative disc disease and moderate bilateral foraminal stenosis. At L4-5 there is a central and left paracentral disc herniation with impingement of the left L4 nerve root in the lateral recess. There is severe central stenosis.    Patient Stated Goals  less pain, increased mobility  Currently in Pain?  Yes    Pain Score  5     Pain Location  Knee    Pain Orientation  Right    Pain Descriptors / Indicators  Aching    Pain Type  Chronic pain    Pain Onset  More than a month ago    Pain Frequency  Intermittent    Aggravating Factors   movement    Effect of Pain on Daily Activities  rest    Multiple Pain Sites  No      Treatment: Transfer training from wc to nu-step with min assist and vc for leaning fwd, getting her legs in the  correct position and vc for correct UE placement , vc for moving her legs in the correct sequence to be able to turn herself around to get close enough to the nu-step seat for safety.   Nu-step x 5 mins ( warm up )   standing in parallel bars and marching x 10 BLE  Standing hip abd x 10 BLE, cues for not going fwd but out to the side   Standing hip ext x 10 BLE , needs cues to not swing fwd repeatedly   Standing hip flex x 10 BLE  Heel raises x 10   Stepping over 1/2 foam, vc to lift her leg higher    Patient has slow cognition and needs VC repeated and demonstration of exercises to perform correct technique. She is not sure that she her hearing aide in her ear correctly and is not sure if she is hearing correctly. She has difficulty remembering the exercise and task at hand. She is aware that she is not doing it correctly but is unable to self correct.                      PT Education - 05/11/17 1356    Education provided  Yes    Education Details  exercise techniques    Person(s) Educated  Patient    Methods  Explanation    Comprehension  Returned demonstration;Verbal cues required;Need further instruction       PT Short Term Goals - 04/20/17 1706      PT SHORT TERM GOAL #1   Title  Patient will be independent in home exercise program to improve strength/mobility for better functional independence with ADLs.    Time  2    Period  Weeks    Status  New    Target Date  05/04/17      PT SHORT TERM GOAL #2   Title  Patient will report a worst pain of 5/10 on VAS in low back to improve tolerance with ADLs and reduced symptoms with activities.     Baseline  7/10 sharp pain    Time  2    Period  Weeks    Status  New    Target Date  05/04/17        PT Long Term Goals - 04/20/17 1708      PT LONG TERM GOAL #1   Title  Patient will report a worst pain of 3/10 on VAS in low back to improve tolerance with ADLs and reduced symptoms with activities.      Baseline  7/10 pain    Time  4    Period  Weeks    Status  New    Target Date  05/18/17      PT LONG TERM GOAL #2   Title  Patient will be independant with self management of pain, posture and exercise to allow transition to home program     Baseline  patient has limited knowedge of appropriate exercise, progression without assistance and cuing    Time  4    Period  Weeks    Status  New    Target Date  05/18/17      PT LONG TERM GOAL #3   Title   Patient will reduce modified Oswestry score to <20 as to demonstrate minimal disability with ADLs including improved sleeping tolerance, walking/sitting tolerance etc for better mobility with ADLs.     Baseline  4/16: 66%    Time  4    Period  Weeks    Status  New    Target Date  05/18/17      PT LONG TERM GOAL #4   Title  Patient will increase lumbar extension strength to at least 4/5 as to improve gross strength for sitting/standing tolerance with better erect posture for increased tolerance with ADLs.     Baseline  2+/5     Time  4    Period  Weeks    Status  New    Target Date  05/18/17            Plan - 05/11/17 1358    Clinical Impression Statement  Pt requires direction and verbal cues for correct performance of standing dynamic balance exercises and strengthening exercises. .Patient has fatigue with endurance and difficulty with transfers and standing tasks.  Patient struggles with posture and ability to perform sit to stand due to weakness, as well as balance with mobility.  Pt encouraged continuing HEP   Patient will benefit from continued skilled PT to improve mobility and safety.    Rehab Potential  Fair    Clinical Impairments Affecting Rehab Potential  (+)motivated, family support (-) chronic condition, age, lymphedema left UE    PT Frequency  2x / week    PT Duration  4 weeks    PT Treatment/Interventions  Manual techniques;Patient/family education;Neuromuscular re-education;Therapeutic exercise;ADLs/Self Care Home  Management;Cryotherapy;Electrical Stimulation;Iontophoresis 4mg /ml Dexamethasone;Moist Heat;Traction;Ultrasound;Functional mobility training;Therapeutic activities;Balance training;Passive range of motion;Energy conservation    PT Next Visit Plan  HEP, core stability, stretch lumbar paraspinals     Consulted and Agree with Plan of Care  Patient       Patient will benefit from skilled therapeutic intervention in order to improve the following deficits and impairments:  Decreased strength, Pain, Impaired perceived functional ability, Increased muscle spasms, Decreased range of motion, Abnormal gait, Decreased activity tolerance, Decreased balance, Decreased mobility, Difficulty walking, Hypomobility, Postural dysfunction, Improper body mechanics  Visit Diagnosis: Chronic bilateral low back pain, with sciatica presence unspecified  Muscle weakness (generalized)  Left shoulder pain, unspecified chronicity  Difficulty in walking, not elsewhere classified  Dizziness and giddiness     Problem List Patient Active Problem List   Diagnosis Date Noted  . NSTEMI (non-ST elevated myocardial infarction) ([place]) 02/04/2017  . Health care maintenance 07/30/2015  . Carcinoma of left breast ([place]) 05/26/2014  . Adult BMI 30+ 03/20/2014  . Morbid obesity ([place]) 03/20/2014  . Severe obesity (BMI 35.0-35.9 with comorbidity) ([place]) 03/20/2014  . Osteoarthritis of both knees 10/24/2013  . Focal lymphocytic colitis 10/11/2013  . Lymphocytic colitis 10/11/2013  . Impingement syndrome of shoulder 09/05/2013  . Impingement syndrome of left shoulder 09/05/2013  . Combined hyperlipidemia 07/08/2013  . HTN (hypertension), benign 07/08/2013  . Diabetes mellitus, type 2 ([place]) 07/08/2013  . Type  2 diabetes mellitus with stage 3 chronic kidney disease ([place]) 07/08/2013  . PA (pernicious anemia) 07/08/2013  . Osteoporosis 07/08/2013  . Tremor 07/08/2013  . Lumbar disc herniation 05/22/2013  . Lumbar paraspinal  muscle spasm 05/22/2013  . Lumbar radiculitis 05/22/2013  . Lumbar stenosis with neurogenic claudication 05/22/2013    Alanson Puls, [place] DPT 05/11/2017, 2:00 PM  [place] MAIN [facility] SERVICES [address] [place], [place], [address] Phone: [phone]   Fax:  [phone]  Name: FLORENDA WATT MRN: [id number] Date of Birth: 02/06/1932

## 2017-05-13 ENCOUNTER — Ambulatory Visit: Payer: Medicare Other

## 2017-05-13 DIAGNOSIS — M545 Low back pain: Secondary | ICD-10-CM | POA: Diagnosis not present

## 2017-05-13 DIAGNOSIS — G8929 Other chronic pain: Secondary | ICD-10-CM

## 2017-05-13 NOTE — Therapy (Addendum)
[place] MAIN [facility] SERVICES [address] [place], [place], [address] Phone: [phone]   Fax:  [phone]  Physical Therapy Treatment  Patient Details  Name: Janet Elliott MRN: [id number] Date of Birth: 28-Jul-1932 Referring Provider: Sharlet Salina, MD   Encounter Date: 05/13/2017  PT End of Session - 05/13/17 1339    Visit Number  7    Number of Visits  8    Date for PT Re-Evaluation  05/18/17    Authorization Type  7/10    PT Start Time  1331    PT Stop Time  1411    PT Time Calculation (min)  40 min       Past Medical History:  Diagnosis Date  . Anxiety   . Arthritis    osteoarthritis  . Breast cancer ([place]) 2002   Left  chemo/radistion  . Carcinoma of left breast ([place])   . Lymphedema   . Sleep apnea     Past Surgical History:  Procedure Laterality Date  . BREAST BIOPSY Right 1980  . CORONARY ANGIOGRAPHY Left 02/05/2017   Procedure: CORONARY ANGIOGRAPHY;  Surgeon: Isaias Cowman, MD;  Location: [place] [facility];  Service: Cardiovascular;  Laterality: Left;  . CORONARY STENT INTERVENTION N/A 02/05/2017   Procedure: CORONARY STENT INTERVENTION;  Surgeon: Isaias Cowman, MD;  Location: [place] [facility];  Service: Cardiovascular;  Laterality: N/A;  . EYE SURGERY    . MASTECTOMY Left 2002    There were no vitals filed for this visit.  Subjective Assessment - 05/13/17 1338    Subjective  Pt doing ok today, reports she earlier had a cortisone shot in her right knee. She reports no falls since lat visit, but initially says she has falls out of the bed frequnctly: unclear of HOH is limiting ability to clarify.     Pertinent History  84 year old female with past medical history significant for left breast cancer status post mastectomy, chemoradiation currently in remission, hypertension, anxiety and sleep apnea presents with HNP (herniated nucleus pulposus) lumbar, lumbar stenosis with neurogenic claudication, and lumbar  radiculitis.  Patient also has left arm lymphedema and uses a lymphedema pump and had NSTEMI on 02/04/17 resulting in left coronary angiography. Pt. has been followed by her doctor for chronic low-back pain for years with radiation into the bilateral buttock, lateral thigh and lateral aspect of the lower leg. Her symptoms have been more consistent with an L5 radiculitis. MRI dated 11/10/12 demonstrates at L5-S1 moderate degenerative disc disease and moderate bilateral foraminal stenosis. At L4-5 there is a central and left paracentral disc herniation with impingement of the left L4 nerve root in the lateral recess. There is severe central stenosis. Her pain reaches moderate to severe intensity prior to injections.         Treatment: Transfer training from wc to nu-step with min assist and vc for leaning fwd, getting her legs in the correct position and vc for correct UE placement , vc for moving her legs in the correct sequence to be able to turn herself around to get close enough to the nu-step seat for safety.    -Nu-step x 5 mins (warm up) : seat 5, arms 8, Level 2 (min-modA for squat pivot, stand pivot to/from NuStep)  -Stand pivot transfer to plinth from transport chair is mod-max assist for weight shifting.  -Seated LAQ: 2x15 bilat on plinth, legs suspended.  *remain seated without back support as tolerated for improved trunk control.  -Seated Heel raises:  2x15, VC for full available ROM  -Seated marching: 2x8 bilat  -Sitting edge of mat, trunk rotation with ball, target cone taps on each side 2x10bilat  -Sitting edge of mat, forward reach to floor 1x10 (RUE)      PT Short Term Goals - 04/20/17 1706      PT SHORT TERM GOAL #1   Title  Patient will be independent in home exercise program to improve strength/mobility for better functional independence with ADLs.    Time  2    Period  Weeks    Status  New    Target Date  05/04/17      PT SHORT TERM GOAL #2   Title  Patient will report  a worst pain of 5/10 on VAS in low back to improve tolerance with ADLs and reduced symptoms with activities.     Baseline  7/10 sharp pain    Time  2    Period  Weeks    Status  New    Target Date  05/04/17        PT Long Term Goals - 04/20/17 1708      PT LONG TERM GOAL #1   Title  Patient will report a worst pain of 3/10 on VAS in low back to improve tolerance with ADLs and reduced symptoms with activities.     Baseline  7/10 pain    Time  4    Period  Weeks    Status  New    Target Date  05/18/17      PT LONG TERM GOAL #2   Title  Patient will be independant with self management of pain, posture and exercise to allow transition to home program     Baseline  patient has limited knowedge of appropriate exercise, progression without assistance and cuing    Time  4    Period  Weeks    Status  New    Target Date  05/18/17      PT LONG TERM GOAL #3   Title   Patient will reduce modified Oswestry score to <20 as to demonstrate minimal disability with ADLs including improved sleeping tolerance, walking/sitting tolerance etc for better mobility with ADLs.     Baseline  4/16: 66%    Time  4    Period  Weeks    Status  New    Target Date  05/18/17      PT LONG TERM GOAL #4   Title  Patient will increase lumbar extension strength to at least 4/5 as to improve gross strength for sitting/standing tolerance with better erect posture for increased tolerance with ADLs.     Baseline  2+/5     Time  4    Period  Weeks    Status  New    Target Date  05/18/17            Plan - 05/13/17 1341    Clinical Impression Statement  Pt reporting this being a difficult week for her and it really shows in her higher level of physical assistance needed to comlete transfers, mod-maxA for weight shifting and even assistance for leg swing.  Pt struggles with attending to counting tasks during exercises, getting lost in counting even when sets are as low at 8 reps. Unable to work with patient in //  bars this session as in prior d/t poor standing tolerance <45 seconds. Focused on generalized leg strength and trunk stability at edge of table to engage core.Unclear  how much of today's cognitive difficulty is off baseline or typical of baseline cog deficits. Progress difficult to demonstrate this session.  At end of session pt reports feeling concerned that she has an awareness of more lethargy and dificulty tihinking clearly over the last couple days. She is unfamiliar to this author, but compared to prior visits she does demonstrate more diffculty moving and tolerating basic mobility than previous sessions. PT had a long discussion with patient and sister encouraging to contact PCP and discuss with them for guidance, both express reluctance to do so inspite of multiple solicitations from PT to do so in the name of prudence. Pt's sister reports concerns over starting a new antidepressant over the weekend, but remains reluctant to reach out to PCP for multiple reasons.     Rehab Potential  Fair    Clinical Impairments Affecting Rehab Potential  (+)motivated, family support (-) chronic condition, age, lymphedema left UE    PT Frequency  2x / week    PT Duration  4 weeks    PT Treatment/Interventions  Manual techniques;Patient/family education;Neuromuscular re-education;Therapeutic exercise;ADLs/Self Care Home Management;Cryotherapy;Electrical Stimulation;Iontophoresis 4mg /ml Dexamethasone;Moist Heat;Traction;Ultrasound;Functional mobility training;Therapeutic activities;Balance training;Passive range of motion;Energy conservation    PT Next Visit Plan  HEP, core stability, stretch lumbar paraspinals     PT Home Exercise Plan  scapular retraction, ROM exercises for shoulders, posture awareness, correction    Consulted and Agree with Plan of Care  Patient       Patient will benefit from skilled therapeutic intervention in order to improve the following deficits and impairments:  Decreased strength, Pain,  Impaired perceived functional ability, Increased muscle spasms, Decreased range of motion, Abnormal gait, Decreased activity tolerance, Decreased balance, Decreased mobility, Difficulty walking, Hypomobility, Postural dysfunction, Improper body mechanics  Visit Diagnosis: Chronic bilateral low back pain, with sciatica presence unspecified     Problem List Patient Active Problem List   Diagnosis Date Noted  . NSTEMI (non-ST elevated myocardial infarction) ([place]) 02/04/2017  . Health care maintenance 07/30/2015  . Carcinoma of left breast ([place]) 05/26/2014  . Adult BMI 30+ 03/20/2014  . Morbid obesity ([place]) 03/20/2014  . Severe obesity (BMI 35.0-35.9 with comorbidity) ([place]) 03/20/2014  . Osteoarthritis of both knees 10/24/2013  . Focal lymphocytic colitis 10/11/2013  . Lymphocytic colitis 10/11/2013  . Impingement syndrome of shoulder 09/05/2013  . Impingement syndrome of left shoulder 09/05/2013  . Combined hyperlipidemia 07/08/2013  . HTN (hypertension), benign 07/08/2013  . Diabetes mellitus, type 2 ([place]) 07/08/2013  . Type 2 diabetes mellitus with stage 3 chronic kidney disease ([place]) 07/08/2013  . PA (pernicious anemia) 07/08/2013  . Osteoporosis 07/08/2013  . Tremor 07/08/2013  . Lumbar disc herniation 05/22/2013  . Lumbar paraspinal muscle spasm 05/22/2013  . Lumbar radiculitis 05/22/2013  . Lumbar stenosis with neurogenic claudication 05/22/2013    2:24 PM, 05/13/17 Etta Grandchild, PT, DPT Physical Therapist - [place]  [phone]   Etta Grandchild 05/13/2017, 2:24 PM  [place] MAIN [facility] SERVICES [address] [place], [place], [address] Phone: [phone]   Fax:  [phone]  Name: Janet Elliott MRN: [id number] Date of Birth: 1932/06/25

## 2017-05-20 ENCOUNTER — Ambulatory Visit: Payer: Medicare Other

## 2017-05-20 DIAGNOSIS — M545 Low back pain: Secondary | ICD-10-CM | POA: Diagnosis not present

## 2017-05-20 DIAGNOSIS — G8929 Other chronic pain: Secondary | ICD-10-CM

## 2017-05-20 DIAGNOSIS — M6281 Muscle weakness (generalized): Secondary | ICD-10-CM

## 2017-05-20 NOTE — Therapy (Signed)
[place] MAIN [facility] SERVICES [address] [place], [place], [address] Phone: [phone]   Fax:  [phone]  Physical Therapy Treatment  Patient Details  Name: Janet Elliott MRN: [id number] Date of Birth: Aug 02, 1932 Referring Provider: Sharlet Salina, MD   Encounter Date: 05/20/2017  PT End of Session - 05/21/17 1014    Visit Number  8    Number of Visits  16    Date for PT Re-Evaluation  06/17/17    Authorization Type  1/10    PT Start Time  1345    PT Stop Time  1430    PT Time Calculation (min)  45 min    Equipment Utilized During Treatment  Gait belt    Activity Tolerance  Patient limited by pain;Treatment limited secondary to medical complications (Comment)    Behavior During Therapy  [facility] for tasks assessed/performed       Past Medical History:  Diagnosis Date  . Anxiety   . Arthritis    osteoarthritis  . Breast cancer ([place]) 2002   Left  chemo/radistion  . Carcinoma of left breast ([place])   . Lymphedema   . Sleep apnea     Past Surgical History:  Procedure Laterality Date  . BREAST BIOPSY Right 1980  . CORONARY ANGIOGRAPHY Left 02/05/2017   Procedure: CORONARY ANGIOGRAPHY;  Surgeon: Isaias Cowman, MD;  Location: [place] [facility];  Service: Cardiovascular;  Laterality: Left;  . CORONARY STENT INTERVENTION N/A 02/05/2017   Procedure: CORONARY STENT INTERVENTION;  Surgeon: Isaias Cowman, MD;  Location: [place] [facility];  Service: Cardiovascular;  Laterality: N/A;  . EYE SURGERY    . MASTECTOMY Left 2002    There were no vitals filed for this visit.  Subjective Assessment - 05/20/17 1602    Subjective  Patient presents with sister who reports that her medication has been changed, hopefully correcting her mental status changes. followed up with doctor on monday and pharmasist yesterday about medication changes.  Has noted weakness of legs at this time.     Pertinent History  84 year old female with past medical  history significant for left breast cancer status post mastectomy, chemoradiation currently in remission, hypertension, anxiety and sleep apnea presents with HNP (herniated nucleus pulposus) lumbar, lumbar stenosis with neurogenic claudication, and lumbar radiculitis.  Patient also has left arm lymphedema and uses a lymphedema pump and had NSTEMI on 02/04/17 resulting in left coronary angiography. Pt. has been followed by her doctor for chronic low-back pain for years with radiation into the bilateral buttock, lateral thigh and lateral aspect of the lower leg. Her symptoms have been more consistent with an L5 radiculitis. MRI dated 11/10/12 demonstrates at L5-S1 moderate degenerative disc disease and moderate bilateral foraminal stenosis. At L4-5 there is a central and left paracentral disc herniation with impingement of the left L4 nerve root in the lateral recess. There is severe central stenosis. Her pain reaches moderate to severe intensity prior to injections.    Limitations  Lifting;Sitting;Standing;Walking;House hold activities    How long can you sit comfortably?  5 minutes    How long can you stand comfortably?  unable to stand independently    How long can you walk comfortably?  unable to stand independently    Diagnostic tests  MRI: MRI dated 11/10/12 demonstrates at L5-S1 moderate degenerative disc disease and moderate bilateral foraminal stenosis. At L4-5 there is a central and left paracentral disc herniation with impingement of the left L4 nerve root in the lateral  recess. There is severe central stenosis.    Patient Stated Goals  less pain, increased mobility    Currently in Pain?  Yes    Pain Score  1     Pain Location  Back    Pain Orientation  Lower    Pain Descriptors / Indicators  Aching    Pain Type  Chronic pain    Pain Onset  More than a month ago    Pain Frequency  Intermittent       RECERT VAS: 8/18   MODI Lumbar extension strength  SPT and STS: Min A with RW   Supine  to sit and sit to supine Mod A : patient demonstrates confusion and required Max A for LE halfway through transition.   scooting up and down in bed x 2 trials max verbal cueing  Bridges 10x ; max verbal cueing for performance.   TrA contraction with marching and OTB 15x   SLR 10x BLE  opp UE and LE raises 6x each side ; patient challenged by reciprical motion                 PT Education - 05/20/17 1604    Education provided  Yes    Education Details  exercise technique , HEP, POC, call doctor to discuss altered mental status     Person(s) Educated  Patient    Methods  Explanation;Demonstration;Verbal cues    Comprehension  Verbalized understanding;Returned demonstration       PT Short Term Goals - 05/20/17 1357      PT SHORT TERM GOAL #1   Title  Patient will be independent in home exercise program to improve strength/mobility for better functional independence with ADLs.    Baseline  performs HEP at home    Time  2    Period  Weeks    Status  Partially Met    Target Date  06/03/17      PT SHORT TERM GOAL #2   Title  Patient will report a worst pain of 5/10 on VAS in low back to improve tolerance with ADLs and reduced symptoms with activities.     Baseline  7/10 sharp pain; 5/16: 4/10     Time  2    Period  Weeks    Status  Achieved        PT Long Term Goals - 05/20/17 1358      PT LONG TERM GOAL #1   Title  Patient will report a worst pain of 3/10 on VAS in low back to improve tolerance with ADLs and reduced symptoms with activities.     Baseline  7/10 pain; 5/16: 4/10     Time  4    Period  Weeks    Status  Partially Met    Target Date  06/17/17      PT LONG TERM GOAL #2   Title  Patient will be independant with self management of pain, posture and exercise to allow transition to home program     Baseline  patient has limited knowedge of appropriate exercise, progression without assistance and cuing    Time  4    Period  Weeks    Status   On-going    Target Date  06/17/17      PT LONG TERM GOAL #3   Title   Patient will reduce modified Oswestry score to <20 as to demonstrate minimal disability with ADLs including improved sleeping tolerance, walking/sitting tolerance etc for better  mobility with ADLs.     Baseline  4/16: 66% 5/16: 58%    Time  4    Period  Weeks    Status  Partially Met    Target Date  06/17/17      PT LONG TERM GOAL #4   Title  Patient will increase lumbar extension strength to at least 4/5 as to improve gross strength for sitting/standing tolerance with better erect posture for increased tolerance with ADLs.     Baseline  2+/5 ; 516: 3/10    Time  4    Period  Weeks    Status  On-going    Target Date  06/17/17      PT LONG TERM GOAL #5   Title  Patient will perform 5x STS with CGA to increase functional independence and mobility.     Baseline  require Min A for single STS     Time  4    Period  Weeks    Status  New    Target Date  06/17/17      Additional Long Term Goals   Additional Long Term Goals  Yes            Plan - 05/21/17 1016    Clinical Impression Statement  Patient demonstrates continued weakness of core and bilateral LE's due to spinal stenosis limited transfer ability at this time. Altered mental status continues to limit patient's ability to follow commands, with education given to sister about need for follow up with doctor. Goals are progressing at this time and patient will continue to benefit from skilled physical therapy to improve pain and mobility.     Rehab Potential  Fair    Clinical Impairments Affecting Rehab Potential  (+)motivated, family support (-) chronic condition, age, lymphedema left UE    PT Frequency  2x / week    PT Duration  4 weeks    PT Treatment/Interventions  Manual techniques;Patient/family education;Neuromuscular re-education;Therapeutic exercise;ADLs/Self Care Home Management;Cryotherapy;Electrical Stimulation;Iontophoresis 16m/ml  Dexamethasone;Moist Heat;Traction;Ultrasound;Functional mobility training;Therapeutic activities;Balance training;Passive range of motion;Energy conservation    PT Next Visit Plan  HEP, core stability, stretch lumbar paraspinals     PT Home Exercise Plan  scapular retraction, ROM exercises for shoulders, posture awareness, correction    Consulted and Agree with Plan of Care  Patient       Patient will benefit from skilled therapeutic intervention in order to improve the following deficits and impairments:  Decreased strength, Pain, Impaired perceived functional ability, Increased muscle spasms, Decreased range of motion, Abnormal gait, Decreased activity tolerance, Decreased balance, Decreased mobility, Difficulty walking, Hypomobility, Postural dysfunction, Improper body mechanics  Visit Diagnosis: Chronic bilateral low back pain, with sciatica presence unspecified  Muscle weakness (generalized)     Problem List Patient Active Problem List   Diagnosis Date Noted  . NSTEMI (non-ST elevated myocardial infarction) ([place] 02/04/2017  . Health care maintenance 07/30/2015  . Carcinoma of left breast ([place] 05/26/2014  . Adult BMI 30+ 03/20/2014  . Morbid obesity ([place] 03/20/2014  . Severe obesity (BMI 35.0-35.9 with comorbidity) ([place] 03/20/2014  . Osteoarthritis of both knees 10/24/2013  . Focal lymphocytic colitis 10/11/2013  . Lymphocytic colitis 10/11/2013  . Impingement syndrome of shoulder 09/05/2013  . Impingement syndrome of left shoulder 09/05/2013  . Combined hyperlipidemia 07/08/2013  . HTN (hypertension), benign 07/08/2013  . Diabetes mellitus, type 2 ([place] 07/08/2013  . Type 2 diabetes mellitus with stage 3 chronic kidney disease ([place] 07/08/2013  . PA (pernicious anemia) 07/08/2013  .  Osteoporosis 07/08/2013  . Tremor 07/08/2013  . Lumbar disc herniation 05/22/2013  . Lumbar paraspinal muscle spasm 05/22/2013  . Lumbar radiculitis 05/22/2013  . Lumbar stenosis with  neurogenic claudication 05/22/2013  Janna Arch, PT, DPT   05/21/2017, 10:18 AM  [place] MAIN [facility] SERVICES [address] [place], [place], [address] Phone: [phone]   Fax:  [phone]  Name: SAPPHIRA HARJO MRN: [id number] Date of Birth: 02-Nov-1932

## 2017-05-25 ENCOUNTER — Ambulatory Visit: Payer: Medicare Other

## 2017-05-25 DIAGNOSIS — M545 Low back pain: Secondary | ICD-10-CM | POA: Diagnosis not present

## 2017-05-25 DIAGNOSIS — M6281 Muscle weakness (generalized): Secondary | ICD-10-CM

## 2017-05-25 DIAGNOSIS — G8929 Other chronic pain: Secondary | ICD-10-CM

## 2017-05-25 NOTE — Therapy (Signed)
[place] MAIN [facility] SERVICES [address] [place], [place], [address] Phone: [phone]   Fax:  [phone]  Physical Therapy Treatment  Patient Details  Name: Janet Elliott MRN: [id number] Date of Birth: Jan 10, 1932 Referring Provider: Sharlet Salina, MD   Encounter Date: 05/25/2017  PT End of Session - 05/25/17 1405    Visit Number  9    Number of Visits  16    Date for PT Re-Evaluation  06/17/17    Authorization Type  2/10    PT Start Time  1344    PT Stop Time  1430    PT Time Calculation (min)  46 min    Equipment Utilized During Treatment  Gait belt    Activity Tolerance  Patient limited by pain;Treatment limited secondary to medical complications (Comment)    Behavior During Therapy  [facility] for tasks assessed/performed       Past Medical History:  Diagnosis Date  . Anxiety   . Arthritis    osteoarthritis  . Breast cancer ([place]) 2002   Left  chemo/radistion  . Carcinoma of left breast ([place])   . Lymphedema   . Sleep apnea     Past Surgical History:  Procedure Laterality Date  . BREAST BIOPSY Right 1980  . CORONARY ANGIOGRAPHY Left 02/05/2017   Procedure: CORONARY ANGIOGRAPHY;  Surgeon: Isaias Cowman, MD;  Location: [place] [facility];  Service: Cardiovascular;  Laterality: Left;  . CORONARY STENT INTERVENTION N/A 02/05/2017   Procedure: CORONARY STENT INTERVENTION;  Surgeon: Isaias Cowman, MD;  Location: [place] [facility];  Service: Cardiovascular;  Laterality: N/A;  . EYE SURGERY    . MASTECTOMY Left 2002    There were no vitals filed for this visit.  Subjective Assessment - 05/25/17 1352    Subjective  Patient went to doctor on Friday. Was taken off of the medication that caused her confusion.  Reports she is feeling much better now.     Pertinent History  84 year old female with past medical history significant for left breast cancer status post mastectomy, chemoradiation currently in remission, hypertension,  anxiety and sleep apnea presents with HNP (herniated nucleus pulposus) lumbar, lumbar stenosis with neurogenic claudication, and lumbar radiculitis.  Patient also has left arm lymphedema and uses a lymphedema pump and had NSTEMI on 02/04/17 resulting in left coronary angiography. Pt. has been followed by her doctor for chronic low-back pain for years with radiation into the bilateral buttock, lateral thigh and lateral aspect of the lower leg. Her symptoms have been more consistent with an L5 radiculitis. MRI dated 11/10/12 demonstrates at L5-S1 moderate degenerative disc disease and moderate bilateral foraminal stenosis. At L4-5 there is a central and left paracentral disc herniation with impingement of the left L4 nerve root in the lateral recess. There is severe central stenosis. Her pain reaches moderate to severe intensity prior to injections.    Limitations  Lifting;Sitting;Standing;Walking;House hold activities    How long can you sit comfortably?  5 minutes    How long can you stand comfortably?  unable to stand independently    How long can you walk comfortably?  unable to stand independently    Diagnostic tests  MRI: MRI dated 11/10/12 demonstrates at L5-S1 moderate degenerative disc disease and moderate bilateral foraminal stenosis. At L4-5 there is a central and left paracentral disc herniation with impingement of the left L4 nerve root in the lateral recess. There is severe central stenosis.    Patient Stated Goals  less pain,  increased mobility    Currently in Pain?  No/denies       Treatment:    standing in parallel bars and marching x 10 BLE; BUE support  Cues for increasing hip flexion.    Standing hip abd x 10 BLE, cues for not going fwd but out to the side    Standing hip ext x 10 BLE , knees fatigued and buckled by end of set.    Standing hip flex x 10 BLE; BUE support, needed standing rest break between LE.    Stepping over 1/2 foam, vc to lift her leg higher ; 10x each leg BUE  support.   Side step over half foam roller in // bars. BUE support;; 10x each leg. Challenged moving RLE due to having to stand on LLE.   Airex pad no UE support. : decreasing UE support to no UE support; multiple trials  6" step toe taps BUE support 10x each side      Pt. response to medical necessity: Patient will continue to benefit from skilled physical therapy to improve pain and mobility.                       PT Education - 05/25/17 1355    Education provided  Yes    Education Details  exercise technique     Person(s) Educated  Patient    Methods  Demonstration;Explanation;Tactile cues;Verbal cues    Comprehension  Verbalized understanding;Returned demonstration;Need further instruction       PT Short Term Goals - 05/20/17 1357      PT SHORT TERM GOAL #1   Title  Patient will be independent in home exercise program to improve strength/mobility for better functional independence with ADLs.    Baseline  performs HEP at home    Time  2    Period  Weeks    Status  Partially Met    Target Date  06/03/17      PT SHORT TERM GOAL #2   Title  Patient will report a worst pain of 5/10 on VAS in low back to improve tolerance with ADLs and reduced symptoms with activities.     Baseline  7/10 sharp pain; 5/16: 4/10     Time  2    Period  Weeks    Status  Achieved        PT Long Term Goals - 05/20/17 1358      PT LONG TERM GOAL #1   Title  Patient will report a worst pain of 3/10 on VAS in low back to improve tolerance with ADLs and reduced symptoms with activities.     Baseline  7/10 pain; 5/16: 4/10     Time  4    Period  Weeks    Status  Partially Met    Target Date  06/17/17      PT LONG TERM GOAL #2   Title  Patient will be independant with self management of pain, posture and exercise to allow transition to home program     Baseline  patient has limited knowedge of appropriate exercise, progression without assistance and cuing    Time  4     Period  Weeks    Status  On-going    Target Date  06/17/17      PT LONG TERM GOAL #3   Title   Patient will reduce modified Oswestry score to <20 as to demonstrate minimal disability with ADLs including improved sleeping tolerance,  walking/sitting tolerance etc for better mobility with ADLs.     Baseline  4/16: 66% 5/16: 58%    Time  4    Period  Weeks    Status  Partially Met    Target Date  06/17/17      PT LONG TERM GOAL #4   Title  Patient will increase lumbar extension strength to at least 4/5 as to improve gross strength for sitting/standing tolerance with better erect posture for increased tolerance with ADLs.     Baseline  2+/5 ; 516: 3/10    Time  4    Period  Weeks    Status  On-going    Target Date  06/17/17      PT LONG TERM GOAL #5   Title  Patient will perform 5x STS with CGA to increase functional independence and mobility.     Baseline  require Min A for single STS     Time  4    Period  Weeks    Status  New    Target Date  06/17/17      Additional Long Term Goals   Additional Long Term Goals  Yes            Plan - 05/25/17 1419    Clinical Impression Statement  Patient fatigues quickly in standing with excessive flexion of bilateral knees. Patient has decreased confused however continues to challenged by counting at this session.  Left leg fatigues quicker than right with standing interventions. Patient will continue to benefit from skilled physical therapy to improve pain and mobility.     Rehab Potential  Fair    Clinical Impairments Affecting Rehab Potential  (+)motivated, family support (-) chronic condition, age, lymphedema left UE    PT Frequency  2x / week    PT Duration  4 weeks    PT Treatment/Interventions  Manual techniques;Patient/family education;Neuromuscular re-education;Therapeutic exercise;ADLs/Self Care Home Management;Cryotherapy;Electrical Stimulation;Iontophoresis 68m/ml Dexamethasone;Moist Heat;Traction;Ultrasound;Functional mobility  training;Therapeutic activities;Balance training;Passive range of motion;Energy conservation    PT Next Visit Plan  HEP, core stability, stretch lumbar paraspinals     PT Home Exercise Plan  scapular retraction, ROM exercises for shoulders, posture awareness, correction    Consulted and Agree with Plan of Care  Patient       Patient will benefit from skilled therapeutic intervention in order to improve the following deficits and impairments:  Decreased strength, Pain, Impaired perceived functional ability, Increased muscle spasms, Decreased range of motion, Abnormal gait, Decreased activity tolerance, Decreased balance, Decreased mobility, Difficulty walking, Hypomobility, Postural dysfunction, Improper body mechanics  Visit Diagnosis: Chronic bilateral low back pain, with sciatica presence unspecified  Muscle weakness (generalized)     Problem List Patient Active Problem List   Diagnosis Date Noted  . NSTEMI (non-ST elevated myocardial infarction) ([place] 02/04/2017  . Health care maintenance 07/30/2015  . Carcinoma of left breast ([place] 05/26/2014  . Adult BMI 30+ 03/20/2014  . Morbid obesity ([place] 03/20/2014  . Severe obesity (BMI 35.0-35.9 with comorbidity) ([place] 03/20/2014  . Osteoarthritis of both knees 10/24/2013  . Focal lymphocytic colitis 10/11/2013  . Lymphocytic colitis 10/11/2013  . Impingement syndrome of shoulder 09/05/2013  . Impingement syndrome of left shoulder 09/05/2013  . Combined hyperlipidemia 07/08/2013  . HTN (hypertension), benign 07/08/2013  . Diabetes mellitus, type 2 ([place] 07/08/2013  . Type 2 diabetes mellitus with stage 3 chronic kidney disease ([place] 07/08/2013  . PA (pernicious anemia) 07/08/2013  . Osteoporosis 07/08/2013  . Tremor 07/08/2013  . Lumbar  disc herniation 05/22/2013  . Lumbar paraspinal muscle spasm 05/22/2013  . Lumbar radiculitis 05/22/2013  . Lumbar stenosis with neurogenic claudication 05/22/2013  Janna Arch, PT,  DPT   05/25/2017, 2:31 PM  [place] MAIN [facility] SERVICES [address] [place], [place], [address] Phone: [phone]   Fax:  [phone]  Name: Janet Elliott MRN: [id number] Date of Birth: 01-18-1932

## 2017-05-27 ENCOUNTER — Ambulatory Visit: Payer: Medicare Other

## 2017-05-27 DIAGNOSIS — G8929 Other chronic pain: Secondary | ICD-10-CM

## 2017-05-27 DIAGNOSIS — M6281 Muscle weakness (generalized): Secondary | ICD-10-CM

## 2017-05-27 DIAGNOSIS — M545 Low back pain: Principal | ICD-10-CM

## 2017-05-27 NOTE — Therapy (Signed)
[place] MAIN [facility] SERVICES [address] [place], [place], [address] Phone: [phone]   Fax:  [phone]  Physical Therapy Treatment  Patient Details  Name: Janet Elliott MRN: [id number] Date of Birth: [birth year] Referring Provider: Sharlet Salina, MD   Encounter Date: 05/27/2017  PT End of Session - 05/27/17 1355    Visit Number  10    Number of Visits  16    Date for PT Re-Evaluation  06/17/17    Authorization Type  3/10    PT Start Time  1346    PT Stop Time  1430    PT Time Calculation (min)  44 min    Equipment Utilized During Treatment  Gait belt    Activity Tolerance  Patient limited by pain;Treatment limited secondary to medical complications (Comment)    Behavior During Therapy  [facility] for tasks assessed/performed       Past Medical History:  Diagnosis Date  . Anxiety   . Arthritis    osteoarthritis  . Breast cancer ([place]) 2002   Left  chemo/radistion  . Carcinoma of left breast ([place])   . Lymphedema   . Sleep apnea     Past Surgical History:  Procedure Laterality Date  . BREAST BIOPSY Right 1980  . CORONARY ANGIOGRAPHY Left 02/05/2017   Procedure: CORONARY ANGIOGRAPHY;  Surgeon: Isaias Cowman, MD;  Location: [place] [facility];  Service: Cardiovascular;  Laterality: Left;  . CORONARY STENT INTERVENTION N/A 02/05/2017   Procedure: CORONARY STENT INTERVENTION;  Surgeon: Isaias Cowman, MD;  Location: [place] [facility];  Service: Cardiovascular;  Laterality: N/A;  . EYE SURGERY    . MASTECTOMY Left 2002    There were no vitals filed for this visit.  Subjective Assessment - 05/27/17 1351    Subjective  Patient reports feeling tired today. Reports no pain or difficulties at this time.     Pertinent History  84 year old female with past medical history significant for left breast cancer status post mastectomy, chemoradiation currently in remission, hypertension, anxiety and sleep apnea presents with HNP (herniated  nucleus pulposus) lumbar, lumbar stenosis with neurogenic claudication, and lumbar radiculitis.  Patient also has left arm lymphedema and uses a lymphedema pump and had NSTEMI on 02/04/17 resulting in left coronary angiography. Pt. has been followed by her doctor for chronic low-back pain for years with radiation into the bilateral buttock, lateral thigh and lateral aspect of the lower leg. Her symptoms have been more consistent with an L5 radiculitis. MRI dated 11/10/12 demonstrates at L5-S1 moderate degenerative disc disease and moderate bilateral foraminal stenosis. At L4-5 there is a central and left paracentral disc herniation with impingement of the left L4 nerve root in the lateral recess. There is severe central stenosis. Her pain reaches moderate to severe intensity prior to injections.    Limitations  Lifting;Sitting;Standing;Walking;House hold activities    How long can you sit comfortably?  5 minutes    How long can you stand comfortably?  unable to stand independently    How long can you walk comfortably?  unable to stand independently    Diagnostic tests  MRI: MRI dated 11/10/12 demonstrates at L5-S1 moderate degenerative disc disease and moderate bilateral foraminal stenosis. At L4-5 there is a central and left paracentral disc herniation with impingement of the left L4 nerve root in the lateral recess. There is severe central stenosis.    Patient Stated Goals  less pain, increased mobility    Currently in Pain?  No/denies  Treatment:   Nustep lvl 2 4 minutes  standing in parallel bars and marching x 10 BLE; BUE support  Cues for increasing hip flexion. ; buckling of bilateral knees due to fatigue.    Side stepping in // bars 4x length of bars    Standing hip ext x 10 BLE , knees fatigued and buckled by end of set. ; required max verbal and tactile cues for support    Standing hip flex x 10 BLE; BUE support, needed standing rest break between LE.    Stepping over 1/2 foam,  vc to lift her leg higher ; 10x each leg BUE support.    Side step over half foam roller in // bars. BUE support;; 10x each leg. Challenged moving RLE due to having to stand on LLE.    Airex pad no UE support. : decreasing UE support to no UE support; multiple trials   6" step toe taps BUE support 12x each side       Pt. response to medical necessity: Patient will continue to benefit from skilled physical therapy to improve pain and mobility                    PT Education - 05/27/17 1354    Education provided  Yes    Education Details  exercise technique     Person(s) Educated  Patient    Methods  Explanation;Demonstration;Verbal cues    Comprehension  Verbalized understanding;Returned demonstration       PT Short Term Goals - 05/20/17 1357      PT SHORT TERM GOAL #1   Title  Patient will be independent in home exercise program to improve strength/mobility for better functional independence with ADLs.    Baseline  performs HEP at home    Time  2    Period  Weeks    Status  Partially Met    Target Date  06/03/17      PT SHORT TERM GOAL #2   Title  Patient will report a worst pain of 5/10 on VAS in low back to improve tolerance with ADLs and reduced symptoms with activities.     Baseline  7/10 sharp pain; 5/16: 4/10     Time  2    Period  Weeks    Status  Achieved        PT Long Term Goals - 05/20/17 1358      PT LONG TERM GOAL #1   Title  Patient will report a worst pain of 3/10 on VAS in low back to improve tolerance with ADLs and reduced symptoms with activities.     Baseline  7/10 pain; 5/16: 4/10     Time  4    Period  Weeks    Status  Partially Met    Target Date  06/17/17      PT LONG TERM GOAL #2   Title  Patient will be independant with self management of pain, posture and exercise to allow transition to home program     Baseline  patient has limited knowedge of appropriate exercise, progression without assistance and cuing    Time  4     Period  Weeks    Status  On-going    Target Date  06/17/17      PT LONG TERM GOAL #3   Title   Patient will reduce modified Oswestry score to <20 as to demonstrate minimal disability with ADLs including improved sleeping tolerance, walking/sitting tolerance etc  for better mobility with ADLs.     Baseline  4/16: 66% 5/16: 58%    Time  4    Period  Weeks    Status  Partially Met    Target Date  06/17/17      PT LONG TERM GOAL #4   Title  Patient will increase lumbar extension strength to at least 4/5 as to improve gross strength for sitting/standing tolerance with better erect posture for increased tolerance with ADLs.     Baseline  2+/5 ; 516: 3/10    Time  4    Period  Weeks    Status  On-going    Target Date  06/17/17      PT LONG TERM GOAL #5   Title  Patient will perform 5x STS with CGA to increase functional independence and mobility.     Baseline  require Min A for single STS     Time  4    Period  Weeks    Status  New    Target Date  06/17/17      Additional Long Term Goals   Additional Long Term Goals  Yes            Plan - 05/27/17 1408    Clinical Impression Statement   Patient is more fatigued this session with more episodes of bilateral knee buckling requiring more frequent seated rest breaks. Patient challenged with clearing half foam roller due to increased demand of hip flexion. Patient will continue to benefit from skilled physical therapy to improve pain and mobility    Rehab Potential  Fair    Clinical Impairments Affecting Rehab Potential  (+)motivated, family support (-) chronic condition, age, lymphedema left UE    PT Frequency  2x / week    PT Duration  4 weeks    PT Treatment/Interventions  Manual techniques;Patient/family education;Neuromuscular re-education;Therapeutic exercise;ADLs/Self Care Home Management;Cryotherapy;Electrical Stimulation;Iontophoresis 53m/ml Dexamethasone;Moist Heat;Traction;Ultrasound;Functional mobility  training;Therapeutic activities;Balance training;Passive range of motion;Energy conservation    PT Next Visit Plan  HEP, core stability, stretch lumbar paraspinals     PT Home Exercise Plan  scapular retraction, ROM exercises for shoulders, posture awareness, correction    Consulted and Agree with Plan of Care  Patient       Patient will benefit from skilled therapeutic intervention in order to improve the following deficits and impairments:  Decreased strength, Pain, Impaired perceived functional ability, Increased muscle spasms, Decreased range of motion, Abnormal gait, Decreased activity tolerance, Decreased balance, Decreased mobility, Difficulty walking, Hypomobility, Postural dysfunction, Improper body mechanics  Visit Diagnosis: Chronic bilateral low back pain, with sciatica presence unspecified  Muscle weakness (generalized)     Problem List Patient Active Problem List   Diagnosis Date Noted  . NSTEMI (non-ST elevated myocardial infarction) ([place] 02/04/2017  . Health care maintenance 07/30/2015  . Carcinoma of left breast ([place] 05/26/2014  . Adult BMI 30+ 03/20/2014  . Morbid obesity ([facility] 03/20/2014  . Severe obesity (BMI 35.0-35.9 with comorbidity) ([facility] 03/20/2014  . Osteoarthritis of both knees 10/24/2013  . Focal lymphocytic colitis 10/11/2013  . Lymphocytic colitis 10/11/2013  . Impingement syndrome of shoulder 09/05/2013  . Impingement syndrome of left shoulder 09/05/2013  . Combined hyperlipidemia 07/08/2013  . HTN (hypertension), benign 07/08/2013  . Diabetes mellitus, type 2 ([facility] 07/08/2013  . Type 2 diabetes mellitus with stage 3 chronic kidney disease ([facility] 07/08/2013  . PA (pernicious anemia) 07/08/2013  . Osteoporosis 07/08/2013  . Tremor 07/08/2013  . Lumbar disc herniation 05/22/2013  .  Lumbar paraspinal muscle spasm 05/22/2013  . Lumbar radiculitis 05/22/2013  . Lumbar stenosis with neurogenic claudication 05/22/2013   Janna Arch, PT,  DPT   05/27/2017, 2:35 PM  [place] MAIN [facility] SERVICES [address] [place], [place], [address] Phone: [phone]   Fax:  [phone]  Name: ANNISTON NELLUMS MRN: [id number] Date of Birth: 11/07/1932

## 2017-05-28 ENCOUNTER — Ambulatory Visit: Payer: Medicare Other | Admitting: Nurse Practitioner

## 2017-06-01 ENCOUNTER — Ambulatory Visit: Payer: Medicare Other

## 2017-06-01 DIAGNOSIS — M6281 Muscle weakness (generalized): Secondary | ICD-10-CM

## 2017-06-01 DIAGNOSIS — M545 Low back pain: Principal | ICD-10-CM

## 2017-06-01 DIAGNOSIS — R262 Difficulty in walking, not elsewhere classified: Secondary | ICD-10-CM

## 2017-06-01 DIAGNOSIS — G8929 Other chronic pain: Secondary | ICD-10-CM

## 2017-06-01 NOTE — Therapy (Signed)
[place] MAIN [facility] SERVICES [address] [place], [place], [address] Phone: [phone]   Fax:  [phone]  Physical Therapy Treatment  Patient Details  Name: Janet Elliott MRN: [id number] Date of Birth: [birth year] Referring Provider: Sharlet Salina, MD   Encounter Date: 06/01/2017  PT End of Session - 06/01/17 1310    Visit Number  11    Number of Visits  16    Date for PT Re-Evaluation  06/17/17    Authorization Type  4/10    PT Start Time  1300    PT Stop Time  1344    PT Time Calculation (min)  44 min    Equipment Utilized During Treatment  Gait belt    Activity Tolerance  Patient limited by pain;Treatment limited secondary to medical complications (Comment)    Behavior During Therapy  [facility] for tasks assessed/performed       Past Medical History:  Diagnosis Date  . Anxiety   . Arthritis    osteoarthritis  . Breast cancer ([place]) 2002   Left  chemo/radistion  . Carcinoma of left breast ([place])   . Lymphedema   . Sleep apnea     Past Surgical History:  Procedure Laterality Date  . BREAST BIOPSY Right 1980  . CORONARY ANGIOGRAPHY Left 02/05/2017   Procedure: CORONARY ANGIOGRAPHY;  Surgeon: Isaias Cowman, MD;  Location: [place] [facility];  Service: Cardiovascular;  Laterality: Left;  . CORONARY STENT INTERVENTION N/A 02/05/2017   Procedure: CORONARY STENT INTERVENTION;  Surgeon: Isaias Cowman, MD;  Location: [place] [facility];  Service: Cardiovascular;  Laterality: N/A;  . EYE SURGERY    . MASTECTOMY Left 2002    There were no vitals filed for this visit.  Subjective Assessment - 06/01/17 1306    Subjective  Patient reports compliance with HEP. Reports ankles are swelling more today than normal. Is early for appointment. No stumbles or falls since last session.     Pertinent History  84 year old female with past medical history significant for left breast cancer status post mastectomy, chemoradiation currently in  remission, hypertension, anxiety and sleep apnea presents with HNP (herniated nucleus pulposus) lumbar, lumbar stenosis with neurogenic claudication, and lumbar radiculitis.  Patient also has left arm lymphedema and uses a lymphedema pump and had NSTEMI on 02/04/17 resulting in left coronary angiography. Pt. has been followed by her doctor for chronic low-back pain for years with radiation into the bilateral buttock, lateral thigh and lateral aspect of the lower leg. Her symptoms have been more consistent with an L5 radiculitis. MRI dated 11/10/12 demonstrates at L5-S1 moderate degenerative disc disease and moderate bilateral foraminal stenosis. At L4-5 there is a central and left paracentral disc herniation with impingement of the left L4 nerve root in the lateral recess. There is severe central stenosis. Her pain reaches moderate to severe intensity prior to injections.    Limitations  Lifting;Sitting;Standing;Walking;House hold activities    How long can you sit comfortably?  5 minutes    How long can you stand comfortably?  unable to stand independently    How long can you walk comfortably?  unable to stand independently    Diagnostic tests  MRI: MRI dated 11/10/12 demonstrates at L5-S1 moderate degenerative disc disease and moderate bilateral foraminal stenosis. At L4-5 there is a central and left paracentral disc herniation with impingement of the left L4 nerve root in the lateral recess. There is severe central stenosis.    Patient Stated Goals  less pain,  increased mobility    Currently in Pain?  No/denies       Treatment:   Nustep lvl 3 4 minutes  Ambulate 8 ft with RW and CGA   standing in parallel bars and marching x 10 BLE; BUE support  Cues for increasing hip flexion. ; buckling of bilateral knees due to fatigue.    Side stepping in // bars 4x length of bars ; BUE support   Speed ladder one foot in each box // bars 4x length of bars BUE support for improved step length and stability with  ambulation.    Standing hip ext x 10 BLE , knees fatigued and buckled by end of set. ; required max verbal and tactile cues for support ; more challenged with R leg this session.      Stepping over 1/2 foam, vc to lift her leg higher ; 10x each leg BUE support.      Airex pad no UE support. : decreasing UE support to no UE support; multiple trials   6" step toe taps BUE support 15x each side   Lateral 6; toe tap BUE support 15x each leg       Pt. response to medical necessity: Patient will continue to benefit from skilled physical therapy to improve pain and mobility                         PT Education - 06/01/17 1308    Education provided  Yes    Education Details  exercise technique     Person(s) Educated  Patient    Methods  Explanation;Demonstration;Verbal cues    Comprehension  Verbalized understanding;Returned demonstration       PT Short Term Goals - 05/20/17 1357      PT SHORT TERM GOAL #1   Title  Patient will be independent in home exercise program to improve strength/mobility for better functional independence with ADLs.    Baseline  performs HEP at home    Time  2    Period  Weeks    Status  Partially Met    Target Date  06/03/17      PT SHORT TERM GOAL #2   Title  Patient will report a worst pain of 5/10 on VAS in low back to improve tolerance with ADLs and reduced symptoms with activities.     Baseline  7/10 sharp pain; 5/16: 4/10     Time  2    Period  Weeks    Status  Achieved        PT Long Term Goals - 05/20/17 1358      PT LONG TERM GOAL #1   Title  Patient will report a worst pain of 3/10 on VAS in low back to improve tolerance with ADLs and reduced symptoms with activities.     Baseline  7/10 pain; 5/16: 4/10     Time  4    Period  Weeks    Status  Partially Met    Target Date  06/17/17      PT LONG TERM GOAL #2   Title  Patient will be independant with self management of pain, posture and exercise to allow  transition to home program     Baseline  patient has limited knowedge of appropriate exercise, progression without assistance and cuing    Time  4    Period  Weeks    Status  On-going    Target Date  06/17/17  PT LONG TERM GOAL #3   Title   Patient will reduce modified Oswestry score to <20 as to demonstrate minimal disability with ADLs including improved sleeping tolerance, walking/sitting tolerance etc for better mobility with ADLs.     Baseline  4/16: 66% 5/16: 58%    Time  4    Period  Weeks    Status  Partially Met    Target Date  06/17/17      PT LONG TERM GOAL #4   Title  Patient will increase lumbar extension strength to at least 4/5 as to improve gross strength for sitting/standing tolerance with better erect posture for increased tolerance with ADLs.     Baseline  2+/5 ; 516: 3/10    Time  4    Period  Weeks    Status  On-going    Target Date  06/17/17      PT LONG TERM GOAL #5   Title  Patient will perform 5x STS with CGA to increase functional independence and mobility.     Baseline  require Min A for single STS     Time  4    Period  Weeks    Status  New    Target Date  06/17/17      Additional Long Term Goals   Additional Long Term Goals  Yes            Plan - 06/01/17 1354    Clinical Impression Statement  Patient's decreased ability to clear L leg when fatigued results in decreased stability and increased episodes of L knee buckling.  Patient required verbal and visual cueing for R hip extension with preference to flex. Patient will continue to benefit from skilled physical therapy to improve pain and mobility    Rehab Potential  Fair    Clinical Impairments Affecting Rehab Potential  (+)motivated, family support (-) chronic condition, age, lymphedema left UE    PT Frequency  2x / week    PT Duration  4 weeks    PT Treatment/Interventions  Manual techniques;Patient/family education;Neuromuscular re-education;Therapeutic exercise;ADLs/Self Care Home  Management;Cryotherapy;Electrical Stimulation;Iontophoresis 62m/ml Dexamethasone;Moist Heat;Traction;Ultrasound;Functional mobility training;Therapeutic activities;Balance training;Passive range of motion;Energy conservation    PT Next Visit Plan  HEP, core stability, stretch lumbar paraspinals     PT Home Exercise Plan  scapular retraction, ROM exercises for shoulders, posture awareness, correction    Consulted and Agree with Plan of Care  Patient       Patient will benefit from skilled therapeutic intervention in order to improve the following deficits and impairments:  Decreased strength, Pain, Impaired perceived functional ability, Increased muscle spasms, Decreased range of motion, Abnormal gait, Decreased activity tolerance, Decreased balance, Decreased mobility, Difficulty walking, Hypomobility, Postural dysfunction, Improper body mechanics  Visit Diagnosis: Chronic bilateral low back pain, with sciatica presence unspecified  Muscle weakness (generalized)  Difficulty in walking, not elsewhere classified     Problem List Patient Active Problem List   Diagnosis Date Noted  . NSTEMI (non-ST elevated myocardial infarction) ([facility] 02/04/2017  . Health care maintenance 07/30/2015  . Carcinoma of left breast ([facility] 05/26/2014  . Adult BMI 30+ 03/20/2014  . Morbid obesity ([facility] [place] 03/20/2014  . Severe obesity (BMI 35.0-35.9 with comorbidity) ([facility] 03/20/2014  . Osteoarthritis of both knees 10/24/2013  . Focal lymphocytic colitis 10/11/2013  . Lymphocytic colitis 10/11/2013  . Impingement syndrome of shoulder 09/05/2013  . Impingement syndrome of left shoulder 09/05/2013  . Combined hyperlipidemia 07/08/2013  . HTN (hypertension), benign 07/08/2013  . Diabetes  mellitus, type 2 ([place]) 07/08/2013  . Type 2 diabetes mellitus with stage 3 chronic kidney disease ([place]) 07/08/2013  . PA (pernicious anemia) 07/08/2013  . Osteoporosis 07/08/2013  . Tremor 07/08/2013  . Lumbar disc herniation  05/22/2013  . Lumbar paraspinal muscle spasm 05/22/2013  . Lumbar radiculitis 05/22/2013  . Lumbar stenosis with neurogenic claudication 05/22/2013   Janna Arch, PT, DPT   06/01/2017, 1:58 PM  [place] [facility] MAIN [facility] SERVICES [address] [place], [place], [address] Phone: [phone]   Fax:  [phone]  Name: Janet Elliott MRN: [id number] Date of Birth: 1932/01/27

## 2017-06-02 ENCOUNTER — Ambulatory Visit
Admission: RE | Admit: 2017-06-02 | Discharge: 2017-06-02 | Disposition: A | Discharge status: Home / Self Care | Payer: Medicare Other | Source: Ambulatory Visit | Attending: Oncology | Admitting: Oncology

## 2017-06-02 DIAGNOSIS — Z170 Estrogen receptor positive status [ER+]: Secondary | ICD-10-CM | POA: Diagnosis not present

## 2017-06-02 DIAGNOSIS — Z1231 Encounter for screening mammogram for malignant neoplasm of breast: Secondary | ICD-10-CM | POA: Diagnosis present

## 2017-06-02 DIAGNOSIS — C50912 Malignant neoplasm of unspecified site of left female breast: Secondary | ICD-10-CM | POA: Diagnosis not present

## 2017-06-03 ENCOUNTER — Ambulatory Visit: Payer: Medicare Other

## 2017-06-03 DIAGNOSIS — M6281 Muscle weakness (generalized): Secondary | ICD-10-CM

## 2017-06-03 DIAGNOSIS — M545 Low back pain: Secondary | ICD-10-CM | POA: Diagnosis not present

## 2017-06-03 DIAGNOSIS — G8929 Other chronic pain: Secondary | ICD-10-CM

## 2017-06-03 DIAGNOSIS — R262 Difficulty in walking, not elsewhere classified: Secondary | ICD-10-CM

## 2017-06-03 NOTE — Therapy (Signed)
[place] MAIN [facility] SERVICES [address] [place], [place], [address] Phone: [phone]   Fax:  [phone]  Physical Therapy Treatment  Patient Details  Name: Janet Elliott MRN: [id number] Date of Birth: 1932/03/17 Referring Provider: Sharlet Salina, MD   Encounter Date: 06/03/2017  PT End of Session - 06/03/17 1355    Visit Number  12    Number of Visits  16    Date for PT Re-Evaluation  06/17/17    Authorization Type  5/10    PT Start Time  1345    PT Stop Time  1430    PT Time Calculation (min)  45 min    Equipment Utilized During Treatment  Gait belt    Activity Tolerance  Patient limited by pain;Treatment limited secondary to medical complications (Comment)    Behavior During Therapy  [facility] for tasks assessed/performed       Past Medical History:  Diagnosis Date  . Anxiety   . Arthritis    osteoarthritis  . Breast cancer ([place]) 2002   Left  chemo/radistion  . Carcinoma of left breast ([place])   . Lymphedema   . Sleep apnea     Past Surgical History:  Procedure Laterality Date  . BREAST BIOPSY Right 1980  . CORONARY ANGIOGRAPHY Left 02/05/2017   Procedure: CORONARY ANGIOGRAPHY;  Surgeon: Isaias Cowman, MD;  Location: [place] [facility];  Service: Cardiovascular;  Laterality: Left;  . CORONARY STENT INTERVENTION N/A 02/05/2017   Procedure: CORONARY STENT INTERVENTION;  Surgeon: Isaias Cowman, MD;  Location: [place] [facility];  Service: Cardiovascular;  Laterality: N/A;  . EYE SURGERY    . MASTECTOMY Left 2002    There were no vitals filed for this visit.  Subjective Assessment - 06/03/17 1353    Subjective  Patient reports right leg was not "behaving " yesterday, wouldn't move when she wanted it too. No stumbles or falls since last session. Not wearing hearing aids today.     Pertinent History  84 year old female with past medical history significant for left breast cancer status post mastectomy, chemoradiation  currently in remission, hypertension, anxiety and sleep apnea presents with HNP (herniated nucleus pulposus) lumbar, lumbar stenosis with neurogenic claudication, and lumbar radiculitis.  Patient also has left arm lymphedema and uses a lymphedema pump and had NSTEMI on 02/04/17 resulting in left coronary angiography. Pt. has been followed by her doctor for chronic low-back pain for years with radiation into the bilateral buttock, lateral thigh and lateral aspect of the lower leg. Her symptoms have been more consistent with an L5 radiculitis. MRI dated 11/10/12 demonstrates at L5-S1 moderate degenerative disc disease and moderate bilateral foraminal stenosis. At L4-5 there is a central and left paracentral disc herniation with impingement of the left L4 nerve root in the lateral recess. There is severe central stenosis. Her pain reaches moderate to severe intensity prior to injections.    Limitations  Lifting;Sitting;Standing;Walking;House hold activities    How long can you sit comfortably?  5 minutes    How long can you stand comfortably?  unable to stand independently    How long can you walk comfortably?  unable to stand independently    Diagnostic tests  MRI: MRI dated 11/10/12 demonstrates at L5-S1 moderate degenerative disc disease and moderate bilateral foraminal stenosis. At L4-5 there is a central and left paracentral disc herniation with impingement of the left L4 nerve root in the lateral recess. There is severe central stenosis.    Patient Stated  Goals  less pain, increased mobility    Currently in Pain?  No/denies        Treatment:   Nustep lvl 3 4 minutes   Ambulate 36 ft with RW and CGA, buckling of R knee occasionally with patient reporting instability.    Airex pad no UE support. : decreasing UE support to no UE support; multiple trials   6" step toe taps BUE support 15x each side ; knees started buckling with fatigue towards end.    Lateral 6 inch; toe tap BUE support 15x each leg      Seated hamstring stretch 2x 30 seconds each side; BLE ; cues for toes to nose and nose to toes for stretch sequencing.   Seated:   LAQ 10x 3 second holds BLE   Adduction squeezes 10x 3 second holds   Patient requires PT to count out loud due to confusion with numerical order   Pt. response to medical necessity: Patient will continue to benefit from skilled physical therapy to improve pain and mobility                         PT Education - 06/03/17 1355    Education provided  Yes    Education Details  exercise technique     Person(s) Educated  Patient    Methods  Explanation;Demonstration;Verbal cues    Comprehension  Verbalized understanding;Returned demonstration       PT Short Term Goals - 05/20/17 1357      PT SHORT TERM GOAL #1   Title  Patient will be independent in home exercise program to improve strength/mobility for better functional independence with ADLs.    Baseline  performs HEP at home    Time  2    Period  Weeks    Status  Partially Met    Target Date  06/03/17      PT SHORT TERM GOAL #2   Title  Patient will report a worst pain of 5/10 on VAS in low back to improve tolerance with ADLs and reduced symptoms with activities.     Baseline  7/10 sharp pain; 5/16: 4/10     Time  2    Period  Weeks    Status  Achieved        PT Long Term Goals - 05/20/17 1358      PT LONG TERM GOAL #1   Title  Patient will report a worst pain of 3/10 on VAS in low back to improve tolerance with ADLs and reduced symptoms with activities.     Baseline  7/10 pain; 5/16: 4/10     Time  4    Period  Weeks    Status  Partially Met    Target Date  06/17/17      PT LONG TERM GOAL #2   Title  Patient will be independant with self management of pain, posture and exercise to allow transition to home program     Baseline  patient has limited knowedge of appropriate exercise, progression without assistance and cuing    Time  4    Period  Weeks    Status   On-going    Target Date  06/17/17      PT LONG TERM GOAL #3   Title   Patient will reduce modified Oswestry score to <20 as to demonstrate minimal disability with ADLs including improved sleeping tolerance, walking/sitting tolerance etc for better mobility with ADLs.  Baseline  4/16: 66% 5/16: 58%    Time  4    Period  Weeks    Status  Partially Met    Target Date  06/17/17      PT LONG TERM GOAL #4   Title  Patient will increase lumbar extension strength to at least 4/5 as to improve gross strength for sitting/standing tolerance with better erect posture for increased tolerance with ADLs.     Baseline  2+/5 ; 516: 3/10    Time  4    Period  Weeks    Status  On-going    Target Date  06/17/17      PT LONG TERM GOAL #5   Title  Patient will perform 5x STS with CGA to increase functional independence and mobility.     Baseline  require Min A for single STS     Time  4    Period  Weeks    Status  New    Target Date  06/17/17      Additional Long Term Goals   Additional Long Term Goals  Yes            Plan - 06/03/17 1415    Clinical Impression Statement  Patient's buckling of bilateral knees R>L limited standing duration with patient c/o instability/fatigue. Patient has limited hip extension due to prolonged seated postioning and limited hamstring strength. Patient will continue to benefit from skilled physical therapy to improve pain and mobility    Rehab Potential  Fair    Clinical Impairments Affecting Rehab Potential  (+)motivated, family support (-) chronic condition, age, lymphedema left UE    PT Frequency  2x / week    PT Duration  4 weeks    PT Treatment/Interventions  Manual techniques;Patient/family education;Neuromuscular re-education;Therapeutic exercise;ADLs/Self Care Home Management;Cryotherapy;Electrical Stimulation;Iontophoresis 52m/ml Dexamethasone;Moist Heat;Traction;Ultrasound;Functional mobility training;Therapeutic activities;Balance training;Passive  range of motion;Energy conservation    PT Next Visit Plan  HEP, core stability, stretch lumbar paraspinals     PT Home Exercise Plan  scapular retraction, ROM exercises for shoulders, posture awareness, correction    Consulted and Agree with Plan of Care  Patient       Patient will benefit from skilled therapeutic intervention in order to improve the following deficits and impairments:  Decreased strength, Pain, Impaired perceived functional ability, Increased muscle spasms, Decreased range of motion, Abnormal gait, Decreased activity tolerance, Decreased balance, Decreased mobility, Difficulty walking, Hypomobility, Postural dysfunction, Improper body mechanics  Visit Diagnosis: Chronic bilateral low back pain, with sciatica presence unspecified  Muscle weakness (generalized)  Difficulty in walking, not elsewhere classified     Problem List Patient Active Problem List   Diagnosis Date Noted  . NSTEMI (non-ST elevated myocardial infarction) ([place] 02/04/2017  . Health care maintenance 07/30/2015  . Carcinoma of left breast ([place] 05/26/2014  . Adult BMI 30+ 03/20/2014  . Morbid obesity ([place] 03/20/2014  . Severe obesity (BMI 35.0-35.9 with comorbidity) ([place] 03/20/2014  . Osteoarthritis of both knees 10/24/2013  . Focal lymphocytic colitis 10/11/2013  . Lymphocytic colitis 10/11/2013  . Impingement syndrome of shoulder 09/05/2013  . Impingement syndrome of left shoulder 09/05/2013  . Combined hyperlipidemia 07/08/2013  . HTN (hypertension), benign 07/08/2013  . Diabetes mellitus, type 2 ([place] 07/08/2013  . Type 2 diabetes mellitus with stage 3 chronic kidney disease ([place] 07/08/2013  . PA (pernicious anemia) 07/08/2013  . Osteoporosis 07/08/2013  . Tremor 07/08/2013  . Lumbar disc herniation 05/22/2013  . Lumbar paraspinal muscle spasm 05/22/2013  . Lumbar  radiculitis 05/22/2013  . Lumbar stenosis with neurogenic claudication 05/22/2013   Janna Arch, PT,  DPT   06/03/2017, 2:31 PM  [place] MAIN [facility] SERVICES [address] [place], [place], [address] Phone: [phone]   Fax:  [phone]  Name: Janet Elliott MRN: [id number] Date of Birth: December 09, 1932

## 2017-06-08 ENCOUNTER — Ambulatory Visit: Payer: Medicare Other | Attending: Physical Medicine and Rehabilitation

## 2017-06-08 DIAGNOSIS — R262 Difficulty in walking, not elsewhere classified: Secondary | ICD-10-CM | POA: Insufficient documentation

## 2017-06-08 DIAGNOSIS — G8929 Other chronic pain: Secondary | ICD-10-CM | POA: Insufficient documentation

## 2017-06-08 DIAGNOSIS — M545 Low back pain: Secondary | ICD-10-CM | POA: Insufficient documentation

## 2017-06-08 DIAGNOSIS — M6281 Muscle weakness (generalized): Secondary | ICD-10-CM | POA: Insufficient documentation

## 2017-06-08 NOTE — Therapy (Signed)
[place] MAIN [facility] SERVICES [place], [place], [address] Phone: [phone]   Fax:  [phone]  Physical Therapy Treatment  Patient Details  Name: Janet Elliott MRN: [id number] Date of Birth: March 29, 1932 Referring Provider: Sharlet Salina, MD   Encounter Date: 06/08/2017  PT End of Session - 06/08/17 1357    Visit Number  13    Number of Visits  16    Date for PT Re-Evaluation  06/17/17    Authorization Type  6/10    PT Start Time  1346    PT Stop Time  1430    PT Time Calculation (min)  44 min    Equipment Utilized During Treatment  Gait belt    Activity Tolerance  Patient limited by pain;Treatment limited secondary to medical complications (Comment)    Behavior During Therapy  [facility] for tasks assessed/performed       Past Medical History:  Diagnosis Date  . Anxiety   . Arthritis    osteoarthritis  . Breast cancer ([place]) 2002   Left  chemo/radistion  . Carcinoma of left breast ([place])   . Lymphedema   . Sleep apnea     Past Surgical History:  Procedure Laterality Date  . BREAST BIOPSY Right 1980  . CORONARY ANGIOGRAPHY Left 02/05/2017   Procedure: CORONARY ANGIOGRAPHY;  Surgeon: Isaias Cowman, MD;  Location: [place] [facility];  Service: Cardiovascular;  Laterality: Left;  . CORONARY STENT INTERVENTION N/A 02/05/2017   Procedure: CORONARY STENT INTERVENTION;  Surgeon: Isaias Cowman, MD;  Location: [place] [facility];  Service: Cardiovascular;  Laterality: N/A;  . EYE SURGERY    . MASTECTOMY Left 2002    There were no vitals filed for this visit.  Subjective Assessment - 06/08/17 1356    Subjective  Patient reports no stumbles or falls since last session. Reports exercise compliance. No concerns at this time.     Pertinent History  84 year old female with past medical history significant for left breast cancer status post mastectomy, chemoradiation currently in remission, hypertension, anxiety and sleep apnea  presents with HNP (herniated nucleus pulposus) lumbar, lumbar stenosis with neurogenic claudication, and lumbar radiculitis.  Patient also has left arm lymphedema and uses a lymphedema pump and had NSTEMI on 02/04/17 resulting in left coronary angiography. Pt. has been followed by her doctor for chronic low-back pain for years with radiation into the bilateral buttock, lateral thigh and lateral aspect of the lower leg. Her symptoms have been more consistent with an L5 radiculitis. MRI dated 11/10/12 demonstrates at L5-S1 moderate degenerative disc disease and moderate bilateral foraminal stenosis. At L4-5 there is a central and left paracentral disc herniation with impingement of the left L4 nerve root in the lateral recess. There is severe central stenosis. Her pain reaches moderate to severe intensity prior to injections.    Limitations  Lifting;Sitting;Standing;Walking;House hold activities    How long can you sit comfortably?  5 minutes    How long can you stand comfortably?  unable to stand independently    How long can you walk comfortably?  unable to stand independently    Diagnostic tests  MRI: MRI dated 11/10/12 demonstrates at L5-S1 moderate degenerative disc disease and moderate bilateral foraminal stenosis. At L4-5 there is a central and left paracentral disc herniation with impingement of the left L4 nerve root in the lateral recess. There is severe central stenosis.    Patient Stated Goals  less pain, increased mobility    Currently in  Pain?  No/denies       Treatment:   Nustep lvl 3 4 minutes   Ambulate2x 36 ft with RW and CGA, buckling of R knee occasionally with patient reporting instability.   Airex: saebo ball transfer from each rung 2x  R arm  Seated leg press bilateral # 70 2x10: Require Mod A for foot placement initially.  Seated leg press unliateral #40 2x10   5x STS BUE support and Min A at last 2 sets    Patient requires PT to count out loud due to confusion with  numerical order    Pt. response to medical necessity: Patient will continue to benefit from skilled physical therapy to improve pain and mobility                       PT Education - 06/08/17 1356    Education provided  Yes    Education Details  exercise technique     Person(s) Educated  Patient    Methods  Explanation;Demonstration;Verbal cues    Comprehension  Verbalized understanding;Returned demonstration       PT Short Term Goals - 05/20/17 1357      PT SHORT TERM GOAL #1   Title  Patient will be independent in home exercise program to improve strength/mobility for better functional independence with ADLs.    Baseline  performs HEP at home    Time  2    Period  Weeks    Status  Partially Met    Target Date  06/03/17      PT SHORT TERM GOAL #2   Title  Patient will report a worst pain of 5/10 on VAS in low back to improve tolerance with ADLs and reduced symptoms with activities.     Baseline  7/10 sharp pain; 5/16: 4/10     Time  2    Period  Weeks    Status  Achieved        PT Long Term Goals - 05/20/17 1358      PT LONG TERM GOAL #1   Title  Patient will report a worst pain of 3/10 on VAS in low back to improve tolerance with ADLs and reduced symptoms with activities.     Baseline  7/10 pain; 5/16: 4/10     Time  4    Period  Weeks    Status  Partially Met    Target Date  06/17/17      PT LONG TERM GOAL #2   Title  Patient will be independant with self management of pain, posture and exercise to allow transition to home program     Baseline  patient has limited knowedge of appropriate exercise, progression without assistance and cuing    Time  4    Period  Weeks    Status  On-going    Target Date  06/17/17      PT LONG TERM GOAL #3   Title   Patient will reduce modified Oswestry score to <20 as to demonstrate minimal disability with ADLs including improved sleeping tolerance, walking/sitting tolerance etc for better mobility with ADLs.      Baseline  4/16: 66% 5/16: 58%    Time  4    Period  Weeks    Status  Partially Met    Target Date  06/17/17      PT LONG TERM GOAL #4   Title  Patient will increase lumbar extension strength to at least 4/5 as  to improve gross strength for sitting/standing tolerance with better erect posture for increased tolerance with ADLs.     Baseline  2+/5 ; 516: 3/10    Time  4    Period  Weeks    Status  On-going    Target Date  06/17/17      PT LONG TERM GOAL #5   Title  Patient will perform 5x STS with CGA to increase functional independence and mobility.     Baseline  require Min A for single STS     Time  4    Period  Weeks    Status  New    Target Date  06/17/17      Additional Long Term Goals   Additional Long Term Goals  Yes            Plan - 06/08/17 1404    Clinical Impression Statement  Patient R knee buckles with ambulation when fatigued. Leg press implemented for generalized LE strengthening with patient demonstrating good form with verbal and tactile cueing. Patient's limited hip extension limits upright posture in standing. Patient will continue to benefit from skilled physical therapy to improve pain and mobility    Rehab Potential  Fair    Clinical Impairments Affecting Rehab Potential  (+)motivated, family support (-) chronic condition, age, lymphedema left UE    PT Frequency  2x / week    PT Duration  4 weeks    PT Treatment/Interventions  Manual techniques;Patient/family education;Neuromuscular re-education;Therapeutic exercise;ADLs/Self Care Home Management;Cryotherapy;Electrical Stimulation;Iontophoresis 56m/ml Dexamethasone;Moist Heat;Traction;Ultrasound;Functional mobility training;Therapeutic activities;Balance training;Passive range of motion;Energy conservation    PT Next Visit Plan  HEP, core stability, stretch lumbar paraspinals     PT Home Exercise Plan  scapular retraction, ROM exercises for shoulders, posture awareness, correction    Consulted and  Agree with Plan of Care  Patient       Patient will benefit from skilled therapeutic intervention in order to improve the following deficits and impairments:  Decreased strength, Pain, Impaired perceived functional ability, Increased muscle spasms, Decreased range of motion, Abnormal gait, Decreased activity tolerance, Decreased balance, Decreased mobility, Difficulty walking, Hypomobility, Postural dysfunction, Improper body mechanics  Visit Diagnosis: Chronic bilateral low back pain, with sciatica presence unspecified  Muscle weakness (generalized)  Difficulty in walking, not elsewhere classified     Problem List Patient Active Problem List   Diagnosis Date Noted  . NSTEMI (non-ST elevated myocardial infarction) ([place] 02/04/2017  . Health care maintenance 07/30/2015  . Carcinoma of left breast ([facility] 05/26/2014  . Adult BMI 30+ 03/20/2014  . Morbid obesity ([place] 03/20/2014  . Severe obesity (BMI 35.0-35.9 with comorbidity) ([facility] 03/20/2014  . Osteoarthritis of both knees 10/24/2013  . Focal lymphocytic colitis 10/11/2013  . Lymphocytic colitis 10/11/2013  . Impingement syndrome of shoulder 09/05/2013  . Impingement syndrome of left shoulder 09/05/2013  . Combined hyperlipidemia 07/08/2013  . HTN (hypertension), benign 07/08/2013  . Diabetes mellitus, type 2 ([place] 07/08/2013  . Type 2 diabetes mellitus with stage 3 chronic kidney disease ([place] 07/08/2013  . PA (pernicious anemia) 07/08/2013  . Osteoporosis 07/08/2013  . Tremor 07/08/2013  . Lumbar disc herniation 05/22/2013  . Lumbar paraspinal muscle spasm 05/22/2013  . Lumbar radiculitis 05/22/2013  . Lumbar stenosis with neurogenic claudication 05/22/2013   MJanna Arch PT, DPT   06/08/2017, 2:37 PM  [facility] [address] [place] [address]Phone: [phone]  Fax:  [phone] Name: Janet Elliott  MRN: [id number] Date of Birth:  1932-05-16

## 2017-06-10 ENCOUNTER — Ambulatory Visit: Payer: Medicare Other

## 2017-06-10 DIAGNOSIS — R262 Difficulty in walking, not elsewhere classified: Secondary | ICD-10-CM

## 2017-06-10 DIAGNOSIS — M545 Low back pain: Secondary | ICD-10-CM | POA: Diagnosis not present

## 2017-06-10 DIAGNOSIS — G8929 Other chronic pain: Secondary | ICD-10-CM

## 2017-06-10 DIAGNOSIS — M6281 Muscle weakness (generalized): Secondary | ICD-10-CM

## 2017-06-10 NOTE — Therapy (Signed)
[place] MAIN [facility] SERVICES [address] [place], [place], [address] Phone: [phone]   Fax:  [phone]  Physical Therapy Treatment  Patient Details  Name: Janet Elliott MRN: [id number] Date of Birth: 1932-12-29 Referring Provider: Sharlet Salina, MD   Encounter Date: 06/10/2017  PT End of Session - 06/10/17 1357    Visit Number  14    Number of Visits  16    Date for PT Re-Evaluation  06/17/17    Authorization Type  7/10    PT Start Time  1345    PT Stop Time  1429    PT Time Calculation (min)  44 min    Equipment Utilized During Treatment  Gait belt    Activity Tolerance  Patient limited by pain;Treatment limited secondary to medical complications (Comment)    Behavior During Therapy  [facility] for tasks assessed/performed       Past Medical History:  Diagnosis Date  . Anxiety   . Arthritis    osteoarthritis  . Breast cancer ([place]) 2002   Left  chemo/radistion  . Carcinoma of left breast ([place])   . Lymphedema   . Sleep apnea     Past Surgical History:  Procedure Laterality Date  . BREAST BIOPSY Right 1980  . CORONARY ANGIOGRAPHY Left 02/05/2017   Procedure: CORONARY ANGIOGRAPHY;  Surgeon: Isaias Cowman, MD;  Location: [place] [facility];  Service: Cardiovascular;  Laterality: Left;  . CORONARY STENT INTERVENTION N/A 02/05/2017   Procedure: CORONARY STENT INTERVENTION;  Surgeon: Isaias Cowman, MD;  Location: [place] [facility];  Service: Cardiovascular;  Laterality: N/A;  . EYE SURGERY    . MASTECTOMY Left 2002    There were no vitals filed for this visit.  Subjective Assessment - 06/10/17 1354    Subjective  Patient's reports no pain, having difficulty with voice today. Sister reports that she walked up and down ramp yeesterday.     Pertinent History  84 year old female with past medical history significant for left breast cancer status post mastectomy, chemoradiation currently in remission, hypertension, anxiety and  sleep apnea presents with HNP (herniated nucleus pulposus) lumbar, lumbar stenosis with neurogenic claudication, and lumbar radiculitis.  Patient also has left arm lymphedema and uses a lymphedema pump and had NSTEMI on 02/04/17 resulting in left coronary angiography. Pt. has been followed by her doctor for chronic low-back pain for years with radiation into the bilateral buttock, lateral thigh and lateral aspect of the lower leg. Her symptoms have been more consistent with an L5 radiculitis. MRI dated 11/10/12 demonstrates at L5-S1 moderate degenerative disc disease and moderate bilateral foraminal stenosis. At L4-5 there is a central and left paracentral disc herniation with impingement of the left L4 nerve root in the lateral recess. There is severe central stenosis. Her pain reaches moderate to severe intensity prior to injections.    Limitations  Lifting;Sitting;Standing;Walking;House hold activities    How long can you sit comfortably?  5 minutes    How long can you stand comfortably?  unable to stand independently    How long can you walk comfortably?  unable to stand independently    Diagnostic tests  MRI: MRI dated 11/10/12 demonstrates at L5-S1 moderate degenerative disc disease and moderate bilateral foraminal stenosis. At L4-5 there is a central and left paracentral disc herniation with impingement of the left L4 nerve root in the lateral recess. There is severe central stenosis.    Patient Stated Goals  less pain, increased mobility  Currently in Pain?  No/denies        Nustep lvl 4 4 minutes, cues for >60 rpm for cardiovascular   Ambulate 98 ft with RW and CGA, buckling of R knee occasionally with patient reporting instability. ; cues for keeping feet inside walker  Static stand with overhead dumbell raises 15x    throw balls into basketball hoop; 15 balls    Airex pad static stance no Ue support 4x 30 second holds, 15 second rest breaks.    5x STS BUE support  One hand on matt one  hand on walker ; 2 sets    5x STS with hands on knees.     Patient requires PT to count out loud due to confusion with numerical order    Pt. response to medical necessity: Patient will continue to benefit from skilled physical therapy to improve pain and mobility                           PT Education - 06/10/17 1356    Education provided  Yes    Education Details  exercise technique     Person(s) Educated  Patient    Methods  Explanation;Demonstration;Verbal cues    Comprehension  Verbalized understanding;Returned demonstration       PT Short Term Goals - 05/20/17 1357      PT SHORT TERM GOAL #1   Title  Patient will be independent in home exercise program to improve strength/mobility for better functional independence with ADLs.    Baseline  performs HEP at home    Time  2    Period  Weeks    Status  Partially Met    Target Date  06/03/17      PT SHORT TERM GOAL #2   Title  Patient will report a worst pain of 5/10 on VAS in low back to improve tolerance with ADLs and reduced symptoms with activities.     Baseline  7/10 sharp pain; 5/16: 4/10     Time  2    Period  Weeks    Status  Achieved        PT Long Term Goals - 05/20/17 1358      PT LONG TERM GOAL #1   Title  Patient will report a worst pain of 3/10 on VAS in low back to improve tolerance with ADLs and reduced symptoms with activities.     Baseline  7/10 pain; 5/16: 4/10     Time  4    Period  Weeks    Status  Partially Met    Target Date  06/17/17      PT LONG TERM GOAL #2   Title  Patient will be independant with self management of pain, posture and exercise to allow transition to home program     Baseline  patient has limited knowedge of appropriate exercise, progression without assistance and cuing    Time  4    Period  Weeks    Status  On-going    Target Date  06/17/17      PT LONG TERM GOAL #3   Title   Patient will reduce modified Oswestry score to <20 as to demonstrate  minimal disability with ADLs including improved sleeping tolerance, walking/sitting tolerance etc for better mobility with ADLs.     Baseline  4/16: 66% 5/16: 58%    Time  4    Period  Weeks    Status  Partially Met    Target Date  06/17/17      PT LONG TERM GOAL #4   Title  Patient will increase lumbar extension strength to at least 4/5 as to improve gross strength for sitting/standing tolerance with better erect posture for increased tolerance with ADLs.     Baseline  2+/5 ; 516: 3/10    Time  4    Period  Weeks    Status  On-going    Target Date  06/17/17      PT LONG TERM GOAL #5   Title  Patient will perform 5x STS with CGA to increase functional independence and mobility.     Baseline  require Min A for single STS     Time  4    Period  Weeks    Status  New    Target Date  06/17/17      Additional Long Term Goals   Additional Long Term Goals  Yes            Plan - 06/10/17 1608    Clinical Impression Statement  Patient demonstrated improved transfer ability demonstrating ability to perform STS with hands on knees for first time. Ambulatory capacity improved with patient ambulating an entire lap around gym without rest break for first time. Patient will continue to benefit from skilled physical therapy to improve pain and mobility    Rehab Potential  Fair    Clinical Impairments Affecting Rehab Potential  (+)motivated, family support (-) chronic condition, age, lymphedema left UE    PT Frequency  2x / week    PT Duration  4 weeks    PT Treatment/Interventions  Manual techniques;Patient/family education;Neuromuscular re-education;Therapeutic exercise;ADLs/Self Care Home Management;Cryotherapy;Electrical Stimulation;Iontophoresis 18m/ml Dexamethasone;Moist Heat;Traction;Ultrasound;Functional mobility training;Therapeutic activities;Balance training;Passive range of motion;Energy conservation    PT Next Visit Plan  HEP, core stability, stretch lumbar paraspinals     PT Home  Exercise Plan  scapular retraction, ROM exercises for shoulders, posture awareness, correction    Consulted and Agree with Plan of Care  Patient       Patient will benefit from skilled therapeutic intervention in order to improve the following deficits and impairments:  Decreased strength, Pain, Impaired perceived functional ability, Increased muscle spasms, Decreased range of motion, Abnormal gait, Decreased activity tolerance, Decreased balance, Decreased mobility, Difficulty walking, Hypomobility, Postural dysfunction, Improper body mechanics  Visit Diagnosis: Chronic bilateral low back pain, with sciatica presence unspecified  Muscle weakness (generalized)  Difficulty in walking, not elsewhere classified     Problem List Patient Active Problem List   Diagnosis Date Noted  . NSTEMI (non-ST elevated myocardial infarction) ([place] 02/04/2017  . Health care maintenance 07/30/2015  . Carcinoma of left breast ([place] 05/26/2014  . Adult BMI 30+ 03/20/2014  . Morbid obesity ([place] 03/20/2014  . Severe obesity (BMI 35.0-35.9 with comorbidity) ([place] 03/20/2014  . Osteoarthritis of both knees 10/24/2013  . Focal lymphocytic colitis 10/11/2013  . Lymphocytic colitis 10/11/2013  . Impingement syndrome of shoulder 09/05/2013  . Impingement syndrome of left shoulder 09/05/2013  . Combined hyperlipidemia 07/08/2013  . HTN (hypertension), benign 07/08/2013  . Diabetes mellitus, type 2 ([place] 07/08/2013  . Type 2 diabetes mellitus with stage 3 chronic kidney disease ([place] 07/08/2013  . PA (pernicious anemia) 07/08/2013  . Osteoporosis 07/08/2013  . Tremor 07/08/2013  . Lumbar disc herniation 05/22/2013  . Lumbar paraspinal muscle spasm 05/22/2013  . Lumbar radiculitis 05/22/2013  . Lumbar stenosis with neurogenic claudication 05/22/2013   MJanna Arch PT, DPT  06/10/2017, 4:08 PM  [place] MAIN [facility] SERVICES [address] [place], [place],  [address] Phone: [phone]   Fax:  [phone]  Name: Janet Elliott MRN: [id number] Date of Birth: Oct 04, 1932

## 2017-06-15 ENCOUNTER — Ambulatory Visit: Payer: Medicare Other

## 2017-06-15 DIAGNOSIS — M545 Low back pain: Secondary | ICD-10-CM | POA: Diagnosis not present

## 2017-06-15 DIAGNOSIS — R262 Difficulty in walking, not elsewhere classified: Secondary | ICD-10-CM

## 2017-06-15 DIAGNOSIS — M6281 Muscle weakness (generalized): Secondary | ICD-10-CM

## 2017-06-15 DIAGNOSIS — G8929 Other chronic pain: Secondary | ICD-10-CM

## 2017-06-15 NOTE — Patient Instructions (Signed)
Access Code: [email or web address]  URL: [email or web address]  Date: 06/15/2017  Prepared by: Janna Arch   Exercises  Standing March with Counter Support - 10 reps - 1 sets - 5 hold - 1x daily - 7x weekly  Side Stepping with Counter Support - 4 reps - 1 sets - 1x daily - 7x weekly  Seated Long Arc Quad - 10 reps - 1 sets - 5 hold - 1x daily - 7x weekly  Sit to Stand with Armchair - 5 reps - 2 sets - 1x daily - 7x weekly  Seated March - 10 reps - 1 sets - 5 hold - 1x daily - 7x weekly  Seated Hip Adduction Squeeze with Ball - 10 reps - 1 sets - 5 hold - 1x daily - 7x weekly  Heel Raise - 10 reps - 1 sets - 5 hold - 1x daily - 7x weekly

## 2017-06-15 NOTE — Therapy (Signed)
[place] MAIN [facility] SERVICES [address] [place], [place], [address] Phone: [phone]   Fax:  [phone]  Physical Therapy Treatment Physical Therapy Progress Note   Dates of reporting period  05/20/17  to   06/15/17   Patient Details  Name: Janet Elliott MRN: [id number] Date of Birth: [birth year] Referring Provider: Sharlet Salina, MD   Encounter Date: 06/15/2017  PT End of Session - 06/15/17 1413    Visit Number  15    Number of Visits  16    Date for PT Re-Evaluation  07/13/17    Authorization Type  8/10 (next will be 1/10)    PT Start Time  1342    PT Stop Time  1430    PT Time Calculation (min)  48 min    Equipment Utilized During Treatment  Gait belt    Activity Tolerance  Treatment limited secondary to medical complications (Comment);Patient tolerated treatment well    Behavior During Therapy  [facility] for tasks assessed/performed       Past Medical History:  Diagnosis Date  . Anxiety   . Arthritis    osteoarthritis  . Breast cancer ([place]) 2002   Left  chemo/radistion  . Carcinoma of left breast ([place])   . Lymphedema   . Sleep apnea     Past Surgical History:  Procedure Laterality Date  . BREAST BIOPSY Right 1980  . CORONARY ANGIOGRAPHY Left 02/05/2017   Procedure: CORONARY ANGIOGRAPHY;  Surgeon: Isaias Cowman, MD;  Location: [place] [facility];  Service: Cardiovascular;  Laterality: Left;  . CORONARY STENT INTERVENTION N/A 02/05/2017   Procedure: CORONARY STENT INTERVENTION;  Surgeon: Isaias Cowman, MD;  Location: [place] [facility];  Service: Cardiovascular;  Laterality: N/A;  . EYE SURGERY    . MASTECTOMY Left 2002    There were no vitals filed for this visit.  Subjective Assessment - 06/15/17 1352    Subjective  Patient's sister reports that doctor appointment friday resulted in doctor wanting patient to exercise and walk more at home.     Pertinent History  84 year old female with past medical history  significant for left breast cancer status post mastectomy, chemoradiation currently in remission, hypertension, anxiety and sleep apnea presents with HNP (herniated nucleus pulposus) lumbar, lumbar stenosis with neurogenic claudication, and lumbar radiculitis.  Patient also has left arm lymphedema and uses a lymphedema pump and had NSTEMI on 02/04/17 resulting in left coronary angiography. Pt. has been followed by her doctor for chronic low-back pain for years with radiation into the bilateral buttock, lateral thigh and lateral aspect of the lower leg. Her symptoms have been more consistent with an L5 radiculitis. MRI dated 11/10/12 demonstrates at L5-S1 moderate degenerative disc disease and moderate bilateral foraminal stenosis. At L4-5 there is a central and left paracentral disc herniation with impingement of the left L4 nerve root in the lateral recess. There is severe central stenosis. Her pain reaches moderate to severe intensity prior to injections.    Limitations  Lifting;Sitting;Standing;Walking;House hold activities    How long can you sit comfortably?  5 minutes    How long can you stand comfortably?  unable to stand independently    How long can you walk comfortably?  unable to stand independently    Diagnostic tests  MRI: MRI dated 11/10/12 demonstrates at L5-S1 moderate degenerative disc disease and moderate bilateral foraminal stenosis. At L4-5 there is a central and left paracentral disc herniation with impingement of the left L4 nerve root in  the lateral recess. There is severe central stenosis.    Patient Stated Goals  less pain, increased mobility    Currently in Pain?  No/denies        VAS: 0/10  MODI Lumber extension strength 5x STS: 57 seconds with UE support  10MWT: 3=.28 m/s 6 seconds, 38 seconds second trial  LE strength 3/5 strength  Nustep Lvl 2 4 minutes SPM>60  Seated LAQ 10x with 5 second holds   Review new HEP with patient and patient's sister/caregiver        Patient's condition has the potential to improve in response to therapy. Maximum improvement is yet to be obtained. The anticipated improvement is attainable and reasonable in a generally predictable time. Start date of reporting period 05/20/17 end date of reporting period 06/15/17. Patient reports     Pt. response to medical necessity: Patient will continue to benefit from skilled physical therapy to improve pain and mobility                  PT Education - 06/15/17 1354    Education provided  Yes    Education Details  POC, goal progresssion    Person(s) Educated  Patient    Methods  Explanation;Demonstration;Verbal cues    Comprehension  Verbalized understanding;Returned demonstration       PT Short Term Goals - 06/15/17 1354      PT SHORT TERM GOAL #1   Title  Patient will be independent in home exercise program to improve strength/mobility for better functional independence with ADLs.    Baseline  performs HEP at home    Time  2    Period  Weeks    Status  Partially Met      PT SHORT TERM GOAL #2   Title  Patient will report a worst pain of 5/10 on VAS in low back to improve tolerance with ADLs and reduced symptoms with activities.     Baseline  7/10 sharp pain; 5/16: 4/10     Time  2    Period  Weeks    Status  Achieved      PT SHORT TERM GOAL #3   Title  Patient will increase BLE gross strength to 4/5 as to improve functional strength for independent gait, increased standing tolerance and increased ADL ability.    Baseline  6/11: 3+/5    Time  2    Period  Weeks    Status  New    Target Date  06/29/17        PT Long Term Goals - 06/15/17 1355      PT LONG TERM GOAL #1   Title  Patient will report a worst pain of 3/10 on VAS in low back to improve tolerance with ADLs and reduced symptoms with activities.     Baseline  7/10 pain; 5/16: 4/10     Time  4    Period  Weeks    Status  Partially Met      PT LONG TERM GOAL #2   Title  Patient will be  independant with self management of pain, posture and exercise to allow transition to home program     Baseline  patient has limited knowedge of appropriate exercise, progression without assistance and cuing    Time  4    Period  Weeks    Status  On-going      PT LONG TERM GOAL #3   Title   Patient will reduce modified Oswestry  score to <20 as to demonstrate minimal disability with ADLs including improved sleeping tolerance, walking/sitting tolerance etc for better mobility with ADLs.     Baseline  4/16: 66% 5/16: 58%    Time  4    Period  Weeks    Status  Partially Met      PT LONG TERM GOAL #4   Title  Patient will increase lumbar extension strength to at least 4/5 as to improve gross strength for sitting/standing tolerance with better erect posture for increased tolerance with ADLs.     Baseline  2+/5 ; 516: 3/10 6/11: with UE support 4-/5    Time  4    Period  Weeks    Status  On-going    Target Date  07/13/17      PT LONG TERM GOAL #5   Title  Patient will perform 5x STS with CGA to increase functional independence and mobility.     Baseline  require Min A for single STS 6/11: 57 seconds with UE support     Time  4    Period  Weeks    Status  Achieved      Additional Long Term Goals   Additional Long Term Goals  Yes      PT LONG TERM GOAL #6   Title  Patient will increase BLE gross strength to 4+/5 as to improve functional strength for independent gait, increased standing tolerance and increased ADL ability.    Baseline  6/11: 3/5     Time  4    Period  Weeks    Status  New    Target Date  07/13/17      PT LONG TERM GOAL #7   Title  Patient will perform 5x STS < 20 seconds with CGA to increase functional independence and mobility.     Baseline  6/11: 57 seconds with UE support    Time  4    Period  Weeks    Status  New    Target Date  07/13/17      PT LONG TERM GOAL #8   Title  Patient will increase 10 meter walk test to >1.34ms as to improve gait speed for better  community ambulation and to reduce fall risk.    Time  4    Period  Weeks    Status  New    Target Date  07/13/17            Plan - 06/15/17 1615    Clinical Impression Statement  Patient progressing towards goals at this time. Will move away from pain goals due to multiple sessions without pain. Continued weakness and limited mobility will now be focus of future sessions.  Patient's condition has the potential to improve in response to therapy. Maximum improvement is yet to be obtained. The anticipated improvement is attainable and reasonable in a generally predictable time.Patient will continue to benefit from skilled physical therapy to improve pain and mobility    Rehab Potential  Fair    Clinical Impairments Affecting Rehab Potential  (+)motivated, family support (-) chronic condition, age, lymphedema left UE    PT Frequency  2x / week    PT Duration  4 weeks    PT Treatment/Interventions  Manual techniques;Patient/family education;Neuromuscular re-education;Therapeutic exercise;ADLs/Self Care Home Management;Cryotherapy;Electrical Stimulation;Iontophoresis '4mg'$ /ml Dexamethasone;Moist Heat;Traction;Ultrasound;Functional mobility training;Therapeutic activities;Balance training;Passive range of motion;Energy conservation    PT Next Visit Plan  LE strength, stability, balance    PT Home Exercise Plan  scapular  retraction, ROM exercises for shoulders, posture awareness, correction    Consulted and Agree with Plan of Care  Patient       Patient will benefit from skilled therapeutic intervention in order to improve the following deficits and impairments:  Decreased strength, Pain, Impaired perceived functional ability, Increased muscle spasms, Decreased range of motion, Abnormal gait, Decreased activity tolerance, Decreased balance, Decreased mobility, Difficulty walking, Hypomobility, Postural dysfunction, Improper body mechanics  Visit Diagnosis: Chronic bilateral low back pain, with  sciatica presence unspecified  Muscle weakness (generalized)  Difficulty in walking, not elsewhere classified     Problem List Patient Active Problem List   Diagnosis Date Noted  . NSTEMI (non-ST elevated myocardial infarction) ([place]) 02/04/2017  . Health care maintenance 07/30/2015  . Carcinoma of left breast ([place]) 05/26/2014  . Adult BMI 30+ 03/20/2014  . Morbid obesity ([place]) 03/20/2014  . Severe obesity (BMI 35.0-35.9 with comorbidity) ([place]) 03/20/2014  . Osteoarthritis of both knees 10/24/2013  . Focal lymphocytic colitis 10/11/2013  . Lymphocytic colitis 10/11/2013  . Impingement syndrome of shoulder 09/05/2013  . Impingement syndrome of left shoulder 09/05/2013  . Combined hyperlipidemia 07/08/2013  . HTN (hypertension), benign 07/08/2013  . Diabetes mellitus, type 2 ([place]) 07/08/2013  . Type 2 diabetes mellitus with stage 3 chronic kidney disease ([place]) 07/08/2013  . PA (pernicious anemia) 07/08/2013  . Osteoporosis 07/08/2013  . Tremor 07/08/2013  . Lumbar disc herniation 05/22/2013  . Lumbar paraspinal muscle spasm 05/22/2013  . Lumbar radiculitis 05/22/2013  . Lumbar stenosis with neurogenic claudication 05/22/2013   Janna Arch, PT, DPT   06/15/2017, 4:16 PM  [place] MAIN [facility] SERVICES [address] [place], [place], [address] Phone: [phone]   Fax:  [phone]  Name: RAEANA BLINN MRN: [id number] Date of Birth: July 16, 1932

## 2017-06-17 ENCOUNTER — Ambulatory Visit: Payer: Medicare Other

## 2017-06-17 DIAGNOSIS — M545 Low back pain: Principal | ICD-10-CM

## 2017-06-17 DIAGNOSIS — R262 Difficulty in walking, not elsewhere classified: Secondary | ICD-10-CM

## 2017-06-17 DIAGNOSIS — G8929 Other chronic pain: Secondary | ICD-10-CM

## 2017-06-17 DIAGNOSIS — M6281 Muscle weakness (generalized): Secondary | ICD-10-CM

## 2017-06-17 NOTE — Therapy (Signed)
[place] MAIN [facility] SERVICES [address] [place], [place], [address] Phone: [phone]   Fax:  [phone]  Physical Therapy Treatment  Patient Details  Name: Janet Elliott MRN: [id number] Date of Birth: [birth year] Referring Provider: Sharlet Salina, MD   Encounter Date: 06/17/2017  PT End of Session - 06/17/17 1353    Visit Number  16    Number of Visits  16    Date for PT Re-Evaluation  07/13/17    Authorization Type  1/10    PT Start Time  1345    PT Stop Time  1432    PT Time Calculation (min)  47 min    Equipment Utilized During Treatment  Gait belt    Activity Tolerance  Treatment limited secondary to medical complications (Comment);Patient tolerated treatment well    Behavior During Therapy  [facility] for tasks assessed/performed       Past Medical History:  Diagnosis Date  . Anxiety   . Arthritis    osteoarthritis  . Breast cancer ([place]) 2002   Left  chemo/radistion  . Carcinoma of left breast ([place])   . Lymphedema   . Sleep apnea     Past Surgical History:  Procedure Laterality Date  . BREAST BIOPSY Right 1980  . CORONARY ANGIOGRAPHY Left 02/05/2017   Procedure: CORONARY ANGIOGRAPHY;  Surgeon: Isaias Cowman, MD;  Location: [place] [facility];  Service: Cardiovascular;  Laterality: Left;  . CORONARY STENT INTERVENTION N/A 02/05/2017   Procedure: CORONARY STENT INTERVENTION;  Surgeon: Isaias Cowman, MD;  Location: [place] [facility];  Service: Cardiovascular;  Laterality: N/A;  . EYE SURGERY    . MASTECTOMY Left 2002    There were no vitals filed for this visit.  Subjective Assessment - 06/17/17 1352    Subjective  Today is patient's birthday and patient reports going to [facility] for brunch. Patient reports not doing her exercises yesterday.     Pertinent History  84 year old female with past medical history significant for left breast cancer status post mastectomy, chemoradiation currently in remission, hypertension,  anxiety and sleep apnea presents with HNP (herniated nucleus pulposus) lumbar, lumbar stenosis with neurogenic claudication, and lumbar radiculitis.  Patient also has left arm lymphedema and uses a lymphedema pump and had NSTEMI on 02/04/17 resulting in left coronary angiography. Pt. has been followed by her doctor for chronic low-back pain for years with radiation into the bilateral buttock, lateral thigh and lateral aspect of the lower leg. Her symptoms have been more consistent with an L5 radiculitis. MRI dated 11/10/12 demonstrates at L5-S1 moderate degenerative disc disease and moderate bilateral foraminal stenosis. At L4-5 there is a central and left paracentral disc herniation with impingement of the left L4 nerve root in the lateral recess. There is severe central stenosis. Her pain reaches moderate to severe intensity prior to injections.    Limitations  Lifting;Sitting;Standing;Walking;House hold activities    How long can you sit comfortably?  5 minutes    How long can you stand comfortably?  unable to stand independently    How long can you walk comfortably?  unable to stand independently    Diagnostic tests  MRI: MRI dated 11/10/12 demonstrates at L5-S1 moderate degenerative disc disease and moderate bilateral foraminal stenosis. At L4-5 there is a central and left paracentral disc herniation with impingement of the left L4 nerve root in the lateral recess. There is severe central stenosis.    Patient Stated Goals  less pain, increased mobility  Currently in Pain?  No/denies       Nustep lvl 4 4 minutes, cues for >60 rpm for cardiovascular    Ambulate 98 ft with RW and CGA, buckling of R knee occasionally with patient reporting instability. ; cues for keeping feet inside walker   step over and back half foam roller in // bars 10x each leg. BUE support   Abduction 10x each leg BUE support // bars  Standing marches // bars 10x each leg BUE support   Standing hamstring curl // bars 10x  each leg BUE support    5x STS with hands on knees. ; 2 sets   6" step toe taps // bars BUE support     Patient requires PT to count out loud due to confusion with numerical order    Pt. response to medical necessity: Patient will continue to benefit from skilled physical therapy to improve pain and mobility                        PT Education - 06/17/17 1353    Education provided  Yes    Education Details  exercise technique, ambulatory capacity     Person(s) Educated  Patient    Methods  Explanation;Demonstration;Verbal cues    Comprehension  Verbalized understanding;Returned demonstration       PT Short Term Goals - 06/15/17 1354      PT SHORT TERM GOAL #1   Title  Patient will be independent in home exercise program to improve strength/mobility for better functional independence with ADLs.    Baseline  performs HEP at home    Time  2    Period  Weeks    Status  Partially Met      PT SHORT TERM GOAL #2   Title  Patient will report a worst pain of 5/10 on VAS in low back to improve tolerance with ADLs and reduced symptoms with activities.     Baseline  7/10 sharp pain; 5/16: 4/10     Time  2    Period  Weeks    Status  Achieved      PT SHORT TERM GOAL #3   Title  Patient will increase BLE gross strength to 4/5 as to improve functional strength for independent gait, increased standing tolerance and increased ADL ability.    Baseline  6/11: 3+/5    Time  2    Period  Weeks    Status  New    Target Date  06/29/17        PT Long Term Goals - 06/15/17 1355      PT LONG TERM GOAL #1   Title  Patient will report a worst pain of 3/10 on VAS in low back to improve tolerance with ADLs and reduced symptoms with activities.     Baseline  7/10 pain; 5/16: 4/10     Time  4    Period  Weeks    Status  Partially Met      PT LONG TERM GOAL #2   Title  Patient will be independant with self management of pain, posture and exercise to allow transition to  home program     Baseline  patient has limited knowedge of appropriate exercise, progression without assistance and cuing    Time  4    Period  Weeks    Status  On-going      PT LONG TERM GOAL #3   Title  Patient will reduce modified Oswestry score to <20 as to demonstrate minimal disability with ADLs including improved sleeping tolerance, walking/sitting tolerance etc for better mobility with ADLs.     Baseline  4/16: 66% 5/16: 58%    Time  4    Period  Weeks    Status  Partially Met      PT LONG TERM GOAL #4   Title  Patient will increase lumbar extension strength to at least 4/5 as to improve gross strength for sitting/standing tolerance with better erect posture for increased tolerance with ADLs.     Baseline  2+/5 ; 516: 3/10 6/11: with UE support 4-/5    Time  4    Period  Weeks    Status  On-going    Target Date  07/13/17      PT LONG TERM GOAL #5   Title  Patient will perform 5x STS with CGA to increase functional independence and mobility.     Baseline  require Min A for single STS 6/11: 57 seconds with UE support     Time  4    Period  Weeks    Status  Achieved      Additional Long Term Goals   Additional Long Term Goals  Yes      PT LONG TERM GOAL #6   Title  Patient will increase BLE gross strength to 4+/5 as to improve functional strength for independent gait, increased standing tolerance and increased ADL ability.    Baseline  6/11: 3/5     Time  4    Period  Weeks    Status  New    Target Date  07/13/17      PT LONG TERM GOAL #7   Title  Patient will perform 5x STS < 20 seconds with CGA to increase functional independence and mobility.     Baseline  6/11: 57 seconds with UE support    Time  4    Period  Weeks    Status  New    Target Date  07/13/17      PT LONG TERM GOAL #8   Title  Patient will increase 10 meter walk test to >1.76ms as to improve gait speed for better community ambulation and to reduce fall risk.    Time  4    Period  Weeks     Status  New    Target Date  07/13/17            Plan - 06/17/17 1426    Clinical Impression Statement  Patient demonstrated good carryover from previous sessions during sit to stand transfer without UE support.  Improved ambulatory capacity with improved velocity of movement. Increased capacity for standing functional activities indicate improved LE strength. Patient will continue to benefit from skilled physical therapy to improve pain and mobility    Rehab Potential  Fair    Clinical Impairments Affecting Rehab Potential  (+)motivated, family support (-) chronic condition, age, lymphedema left UE    PT Frequency  2x / week    PT Duration  4 weeks    PT Treatment/Interventions  Manual techniques;Patient/family education;Neuromuscular re-education;Therapeutic exercise;ADLs/Self Care Home Management;Cryotherapy;Electrical Stimulation;Iontophoresis 434mml Dexamethasone;Moist Heat;Traction;Ultrasound;Functional mobility training;Therapeutic activities;Balance training;Passive range of motion;Energy conservation    PT Next Visit Plan  LE strength, stability, balance    PT Home Exercise Plan  scapular retraction, ROM exercises for shoulders, posture awareness, correction    Consulted and Agree with Plan of Care  Patient  Patient will benefit from skilled therapeutic intervention in order to improve the following deficits and impairments:  Decreased strength, Pain, Impaired perceived functional ability, Increased muscle spasms, Decreased range of motion, Abnormal gait, Decreased activity tolerance, Decreased balance, Decreased mobility, Difficulty walking, Hypomobility, Postural dysfunction, Improper body mechanics  Visit Diagnosis: Chronic bilateral low back pain, with sciatica presence unspecified  Muscle weakness (generalized)  Difficulty in walking, not elsewhere classified     Problem List Patient Active Problem List   Diagnosis Date Noted  . NSTEMI (non-ST elevated  myocardial infarction) ([place]) 02/04/2017  . Health care maintenance 07/30/2015  . Carcinoma of left breast ([place]) 05/26/2014  . Adult BMI 30+ 03/20/2014  . Morbid obesity ([place]) 03/20/2014  . Severe obesity (BMI 35.0-35.9 with comorbidity) ([place]) 03/20/2014  . Osteoarthritis of both knees 10/24/2013  . Focal lymphocytic colitis 10/11/2013  . Lymphocytic colitis 10/11/2013  . Impingement syndrome of shoulder 09/05/2013  . Impingement syndrome of left shoulder 09/05/2013  . Combined hyperlipidemia 07/08/2013  . HTN (hypertension), benign 07/08/2013  . Diabetes mellitus, type 2 ([place]) 07/08/2013  . Type 2 diabetes mellitus with stage 3 chronic kidney disease ([place]) 07/08/2013  . PA (pernicious anemia) 07/08/2013  . Osteoporosis 07/08/2013  . Tremor 07/08/2013  . Lumbar disc herniation 05/22/2013  . Lumbar paraspinal muscle spasm 05/22/2013  . Lumbar radiculitis 05/22/2013  . Lumbar stenosis with neurogenic claudication 05/22/2013   Janna Arch, PT, DPT   06/17/2017, 2:35 PM  [place] MAIN [facility] SERVICES [address] [place], [place], [address] Phone: [phone]   Fax:  [phone]  Name: Janet Elliott MRN: [id number] Date of Birth: 1932-01-28

## 2017-06-18 ENCOUNTER — Encounter: Payer: Self-pay | Admitting: Oncology

## 2017-06-18 ENCOUNTER — Inpatient Hospital Stay: Payer: Medicare Other | Attending: Oncology | Admitting: Oncology

## 2017-06-18 VITALS — BP 140/80 | HR 63 | Temp 98.5°F | Resp 18 | Ht 59.0 in | Wt 190.1 lb

## 2017-06-18 DIAGNOSIS — I252 Old myocardial infarction: Secondary | ICD-10-CM | POA: Diagnosis not present

## 2017-06-18 DIAGNOSIS — Z853 Personal history of malignant neoplasm of breast: Secondary | ICD-10-CM | POA: Diagnosis not present

## 2017-06-18 DIAGNOSIS — I890 Lymphedema, not elsewhere classified: Secondary | ICD-10-CM | POA: Diagnosis not present

## 2017-06-18 DIAGNOSIS — Z08 Encounter for follow-up examination after completed treatment for malignant neoplasm: Secondary | ICD-10-CM | POA: Diagnosis not present

## 2017-06-18 NOTE — Progress Notes (Signed)
No new changes noted today 

## 2017-06-21 ENCOUNTER — Telehealth: Payer: Self-pay | Admitting: *Deleted

## 2017-06-21 ENCOUNTER — Ambulatory Visit: Payer: Medicare Other

## 2017-06-21 DIAGNOSIS — G8929 Other chronic pain: Secondary | ICD-10-CM

## 2017-06-21 DIAGNOSIS — R262 Difficulty in walking, not elsewhere classified: Secondary | ICD-10-CM

## 2017-06-21 DIAGNOSIS — M6281 Muscle weakness (generalized): Secondary | ICD-10-CM

## 2017-06-21 DIAGNOSIS — M545 Low back pain: Secondary | ICD-10-CM | POA: Diagnosis not present

## 2017-06-21 NOTE — Telephone Encounter (Signed)
Faxed last note to Dr. Leonides Schanz. Patient goes to gyn once a year to get gyn check and pt is 17 years out from her breast cancer and does not need to rtn to onoclogy. Asking GYN to take over ordering yearly mammograms and she already has mammogram for this year 06/02/2017. Receipt confirmed. Also faxed last md note to Dr. Ouida Sills for him to take over her care since she is 17 years out from dx of breast cancer with no recurrence. Receipt confirmation.

## 2017-06-21 NOTE — Progress Notes (Signed)
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  2.  Estrogen receptor positive more than 95%.  3.  Progesterone receptor positive more than 95%.  4.  HER-2/neu 1+. FISH study negative.  5.  Started on chemotherapy with Epirubicin and Taxotere in June of 2002 as neoadjuvant treatment.  6.  Surgery with mastectomy in September of 2002.  Residual carcinoma involving skin and nipple.  Seven out of fourteen lymph nodes are positive.  7.  Started on radiation therapy to the chest wall in November of 2002.  8.  Finished radiation therapy on December 31, 2000.  Received 5,040 cGy in 28 fractions.  9.Femara has been discontinued in December of 2012 9.  Arimidex 1 tablet daily completed Sept. 2007.  10. Extended adjuvant hormonal therapy with Letrozole initiated 10/07. 11. Antihormone therapy discontinued  12lymphedema of left upper extremity.  Recurrent cellulitis      Carcinoma of left breast ([place])   05/25/2000 Initial Diagnosis    Carcinoma of left breast        Interval history- Patient has NSTEMI in feb 2019 requiring stent placement. She is slowly recovering from that and is undergoing phyiscal therapy. She has not had any cellulitis recently. She continues to wear the lymphedema sleeve  ECOG PS- 2 Pain scale- 0   Review of systems- Review of Systems  Constitutional: Positive for malaise/fatigue. Negative for chills, fever and weight loss.  HENT: Negative for congestion, ear discharge and  nosebleeds.   Eyes: Negative for blurred vision.  Respiratory: Negative for cough, hemoptysis, sputum production, shortness of breath and wheezing.   Cardiovascular: Negative for chest pain, palpitations, orthopnea and claudication.  Gastrointestinal: Negative for abdominal pain, blood in stool, constipation, diarrhea, heartburn, melena, nausea and vomiting.  Genitourinary: Negative for dysuria, flank pain, frequency, hematuria and urgency.  Musculoskeletal: Negative for back pain, joint pain and myalgias.  Skin: Negative for rash.  Neurological: Negative for dizziness, tingling, focal weakness, seizures, weakness and headaches.  Endo/Heme/Allergies: Does not bruise/bleed easily.  Psychiatric/Behavioral: Negative for depression and suicidal ideas. The patient does not have insomnia.       Allergies  Allergen Reactions  . Codeine     Dizzy and nauseaus  . Statins Other (See Comments)     Past Medical History:  Diagnosis Date  . Anxiety   . Arthritis    osteoarthritis  . Breast cancer ([place]) 2002   Left  chemo/radistion  . Carcinoma of left breast ([place])   . Lymphedema   . Sleep apnea      Past Surgical History:  Procedure Laterality Date  . BREAST BIOPSY Right 1980  . CORONARY ANGIOGRAPHY Left 02/05/2017   Procedure: CORONARY ANGIOGRAPHY;  Surgeon: Isaias Cowman, MD;  Location: [place] [facility];  Service: Cardiovascular;  Laterality: Left;  . CORONARY STENT INTERVENTION N/A 02/05/2017   Procedure: CORONARY STENT INTERVENTION;  Surgeon: Isaias Cowman, MD;  Location: [place] [facility];  Service: Cardiovascular;  Laterality: N/A;  . EYE SURGERY    . MASTECTOMY  Left 2002    Social History   Socioeconomic History  . Marital status: Single    Spouse name: Not on file  . Number of children: Not on file  . Years of education: Not on file  . Highest education level: Not on file  Occupational History  . Not on file  Social Needs  . Financial resource  strain: Not on file  . Food insecurity:    Worry: Not on file    Inability: Not on file  . Transportation needs:    Medical: Not on file    Non-medical: Not on file  Tobacco Use  . Smoking status: Never Smoker  . Smokeless tobacco: Never Used  Substance and Sexual Activity  . Alcohol use: No  . Drug use: Not on file  . Sexual activity: Not on file  Lifestyle  . Physical activity:    Days per week: Not on file    Minutes per session: Not on file  . Stress: Not on file  Relationships  . Social connections:    Talks on phone: Not on file    Gets together: Not on file    Attends religious service: Not on file    Active member of club or organization: Not on file    Attends meetings of clubs or organizations: Not on file    Relationship status: Not on file  . Intimate partner violence:    Fear of current or ex partner: Not on file    Emotionally abused: Not on file    Physically abused: Not on file    Forced sexual activity: Not on file  Other Topics Concern  . Not on file  Social History Narrative  . Not on file    Family History  Problem Relation Age of Onset  . Breast cancer Maternal Aunt   . Breast cancer Cousin   . Breast cancer Cousin      Current Outpatient Medications:  .  aspirin EC 81 MG tablet, Take 81 mg by mouth every other day. , Disp: , Rfl:  .  clopidogrel (PLAVIX) 75 MG tablet, Take 1 tablet (75 mg total) by mouth daily., Disp: 30 tablet, Rfl: 0 .  cyanocobalamin (V-R VITAMIN B-12) 500 MCG tablet, Take 500 mcg by mouth., Disp: , Rfl:  .  diclofenac sodium (VOLTAREN) 1 % GEL, Apply 2 g topically 4 (four) times daily. Reported on 05/29/2015, Disp: , Rfl:  .  esomeprazole (NEXIUM) 20 MG capsule, Take 20 mg by mouth daily. , Disp: , Rfl:  .  fexofenadine (ALLEGRA) 180 MG tablet, Take 180 mg by mouth daily., Disp: , Rfl:  .  glimepiride (AMARYL) 4 MG tablet, Take 4 mg by mouth daily with breakfast. , Disp: , Rfl:  .  glucose blood (ONE TOUCH ULTRA TEST)  test strip, CHECK BLOOD SUGAR TWICE A DAY, Disp: , Rfl:  .  Lancets Misc. (UNISTIK 2 NORMAL) MISC, USE AS DIRECTED, Disp: , Rfl:  .  losartan (COZAAR) 100 MG tablet, Take 100 mg by mouth daily. Reported on 05/29/2015, Disp: , Rfl:  .  Methylcellulose, Laxative, (CITRUCEL) 500 MG TABS, Take 1 tablet by mouth daily at 6 (six) AM., Disp: , Rfl:  .  metoprolol tartrate (LOPRESSOR) 25 MG tablet, Take 1 tablet (25 mg total) by mouth 2 (two) times daily., Disp: 60 tablet, Rfl: 0 .  ONGLYZA 5 MG TABS tablet, Take 5 mg by mouth every morning. , Disp: , Rfl:  .  oxybutynin (DITROPAN-XL) 5 MG  24 hr tablet, Take 5 mg by mouth at bedtime., Disp: , Rfl:  .  PARoxetine (PAXIL) 20 MG tablet, Take 10 mg by mouth daily., Disp: , Rfl:  .  pravastatin (PRAVACHOL) 10 MG tablet, Take 10 mg by mouth 3 (three) times a week., Disp: , Rfl:  .  pregabalin (LYRICA) 50 MG capsule, Take 50 mg by mouth every evening. , Disp: , Rfl:  .  Probiotic Product (PHILLIPS COLON HEALTH PO), Take 1 capsule by mouth every morning. , Disp: , Rfl:  .  Vitamin D, Ergocalciferol, (DRISDOL) 50000 UNITS CAPS capsule, Take 50,000 Units by mouth every 7 (seven) days. , Disp: , Rfl:   Physical exam:  Vitals:   06/18/17 1416  BP: 140/80  Pulse: 63  Resp: 18  Temp: 98.5 F (36.9 C)  SpO2: 96%  Weight: 190 lb 1.6 oz (86.2 kg)  Height: _0  (1.499 m)   Physical Exam  Constitutional: She is oriented to person, place, and time.  Patient is obese. She is sitting in a wheelchair and does not appear to be in any acute distress  HENT:  Head: Normocephalic and atraumatic.  Eyes: Pupils are equal, round, and reactive to light. EOM are normal.  Neck: Normal range of motion.  Cardiovascular: Normal rate, regular rhythm and normal heart sounds.  Pulmonary/Chest: Effort normal and breath sounds normal.  Abdominal: Soft. Bowel sounds are normal.  Musculoskeletal:  LUE lymphedema sleeve in place  Neurological: She is alert and oriented to person,  place, and time.  Skin: Skin is warm and dry.   breast exam is limited as patient is in a wheelchair and unable to sit up on the examination table. Exam performed in sitting position. No palpable masses in the right breast. No chest wall recurrence on the left. No palpable b/l axillary adenopathy  CMP Latest Ref Rng & Units 02/06/2017  Glucose 65 - 99 mg/dL 174(H)  BUN 6 - 20 mg/dL 16  Creatinine 0.44 - 1.00 mg/dL 0.69  Sodium 135 - 145 mmol/L 134(L)  Potassium 3.5 - 5.1 mmol/L 4.4  Chloride 101 - 111 mmol/L 100(L)  CO2 22 - 32 mmol/L 24  Calcium 8.9 - 10.3 mg/dL 8.8(L)  Total Protein 6.5 - 8.1 g/dL -  Total Bilirubin 0.3 - 1.2 mg/dL -  Alkaline Phos 38 - 126 U/L -  AST 15 - 41 U/L -  ALT 14 - 54 U/L -   CBC Latest Ref Rng & Units 02/05/2017  WBC 3.6 - 11.0 K/uL 7.9  Hemoglobin 12.0 - 16.0 g/dL 12.4  Hematocrit 35.0 - 47.0 % 36.8  Platelets 150 - 440 K/uL 193    No images are attached to the encounter.  Mm Screening Breast Tomo Uni R  Result Date: 06/02/2017 CLINICAL DATA:  Screening. EXAM: DIGITAL SCREENING UNILATERAL RIGHT MAMMOGRAM WITH CAD AND TOMO COMPARISON:  Previous exam(s). ACR Breast Density Category c: The breast tissue is heterogeneously dense, which may obscure small masses. FINDINGS: The patient has had a left mastectomy. There are no findings suspicious for malignancy. Images were processed with CAD. IMPRESSION: No mammographic evidence of malignancy. A result letter of this screening mammogram will be mailed directly to the patient. RECOMMENDATION: Screening mammogram in one year.  (Code:SM-R-80M) BI-RADS CATEGORY  1: Negative. Electronically Signed   By: Lillia Mountain M.D.   On: 06/02/2017 12:34     Assessment and plan- Patient is a 84 y.o. female with a h/o ER positive stage III breast cancer in 2002 who  has completed hormone treatment in 2007. She is here for routine surveillance of her breast cancer  1. Patients breast cancer was 17 years ago. Recent mammogram from  may 2019 was negative for malignancy. I feel at this point she can follow up with Dr. Ouida Sills. We will get in touch with his office to see if he can do her yearly mammograms and breast exam. Patient concerned that she has had cellulitis of her LUE in the past and Dr. Oliva Bustard gave them prophylactic augmentin at all times. Her last infection was in 2016. None since then. No evidence of cellulitis today. Again we will let Dr. Ouida Sills know about this. He woule be able to prescribe antibiotics if she were to have any cellulitis in the future and does not require oncology follow up specifically for this reason.  We are happy to assist her should there be any future questions or concerns   Visit Diagnosis 1. Encounter for follow-up surveillance of breast cancer      Dr. Randa Evens, MD, MPH [facility] at [facility] [phone] 06/21/2017 8:21 AM

## 2017-06-21 NOTE — Therapy (Signed)
[place] MAIN [facility] SERVICES [address] [place], [place], [address] Phone: [phone]   Fax:  [phone]  Physical Therapy Treatment  Patient Details  Name: Janet Elliott MRN: [id number] Date of Birth: [birth year] Referring Provider: Sharlet Salina, MD   Encounter Date: 06/21/2017  PT End of Session - 06/21/17 1440    Visit Number  17    Number of Visits  32    Date for PT Re-Evaluation  07/13/17    Authorization Type  2/10    PT Start Time  1430    PT Stop Time  1515    PT Time Calculation (min)  45 min    Equipment Utilized During Treatment  Gait belt    Activity Tolerance  Treatment limited secondary to medical complications (Comment);Patient tolerated treatment well    Behavior During Therapy  [facility] for tasks assessed/performed       Past Medical History:  Diagnosis Date  . Anxiety   . Arthritis    osteoarthritis  . Breast cancer ([place]) 2002   Left  chemo/radistion  . Carcinoma of left breast ([place])   . Lymphedema   . Sleep apnea     Past Surgical History:  Procedure Laterality Date  . BREAST BIOPSY Right 1980  . CORONARY ANGIOGRAPHY Left 02/05/2017   Procedure: CORONARY ANGIOGRAPHY;  Surgeon: Isaias Cowman, MD;  Location: [place] [facility];  Service: Cardiovascular;  Laterality: Left;  . CORONARY STENT INTERVENTION N/A 02/05/2017   Procedure: CORONARY STENT INTERVENTION;  Surgeon: Isaias Cowman, MD;  Location: [place] [facility];  Service: Cardiovascular;  Laterality: N/A;  . EYE SURGERY    . MASTECTOMY Left 2002    There were no vitals filed for this visit.  Subjective Assessment - 06/21/17 1438    Subjective  Patient went to oncologist for annual and was dismissed to family doctor. Patient reports doing her exercises at home.     Pertinent History  84 year old female with past medical history significant for left breast cancer status post mastectomy, chemoradiation currently in remission, hypertension, anxiety  and sleep apnea presents with HNP (herniated nucleus pulposus) lumbar, lumbar stenosis with neurogenic claudication, and lumbar radiculitis.  Patient also has left arm lymphedema and uses a lymphedema pump and had NSTEMI on 02/04/17 resulting in left coronary angiography. Pt. has been followed by her doctor for chronic low-back pain for years with radiation into the bilateral buttock, lateral thigh and lateral aspect of the lower leg. Her symptoms have been more consistent with an L5 radiculitis. MRI dated 11/10/12 demonstrates at L5-S1 moderate degenerative disc disease and moderate bilateral foraminal stenosis. At L4-5 there is a central and left paracentral disc herniation with impingement of the left L4 nerve root in the lateral recess. There is severe central stenosis. Her pain reaches moderate to severe intensity prior to injections.    Limitations  Lifting;Sitting;Standing;Walking;House hold activities    How long can you sit comfortably?  5 minutes    How long can you stand comfortably?  unable to stand independently    How long can you walk comfortably?  unable to stand independently    Diagnostic tests  MRI: MRI dated 11/10/12 demonstrates at L5-S1 moderate degenerative disc disease and moderate bilateral foraminal stenosis. At L4-5 there is a central and left paracentral disc herniation with impingement of the left L4 nerve root in the lateral recess. There is severe central stenosis.    Patient Stated Goals  less pain, increased mobility  Currently in Pain?  No/denies      Nustep lvl 4 4 minutes, cues for >60 rpm for cardiovascular    Ambulate 98 ft with RW and CGA, buckling of R knee occasionally with patient reporting instability. ; cues for keeping feet inside walker  Seated leg press bilateral # 90 2x12: Require Mod A for foot placement initially.  Seated leg press unliateral #45 2x10       5x STS with hands on knees. ; 2 sets    6" step toe taps // bars BUE support     Patient  requires PT to count out loud due to confusion with numerical order    Pt. response to medical necessity: Patient will continue to benefit from skilled physical therapy to improve pain and mobility                         PT Education - 06/21/17 1440    Education provided  Yes    Education Details  exercise technique, ambulatory capacity     Person(s) Educated  Patient    Methods  Explanation;Demonstration;Verbal cues    Comprehension  Verbalized understanding;Returned demonstration       PT Short Term Goals - 06/15/17 1354      PT SHORT TERM GOAL #1   Title  Patient will be independent in home exercise program to improve strength/mobility for better functional independence with ADLs.    Baseline  performs HEP at home    Time  2    Period  Weeks    Status  Partially Met      PT SHORT TERM GOAL #2   Title  Patient will report a worst pain of 5/10 on VAS in low back to improve tolerance with ADLs and reduced symptoms with activities.     Baseline  7/10 sharp pain; 5/16: 4/10     Time  2    Period  Weeks    Status  Achieved      PT SHORT TERM GOAL #3   Title  Patient will increase BLE gross strength to 4/5 as to improve functional strength for independent gait, increased standing tolerance and increased ADL ability.    Baseline  6/11: 3+/5    Time  2    Period  Weeks    Status  New    Target Date  06/29/17        PT Long Term Goals - 06/15/17 1355      PT LONG TERM GOAL #1   Title  Patient will report a worst pain of 3/10 on VAS in low back to improve tolerance with ADLs and reduced symptoms with activities.     Baseline  7/10 pain; 5/16: 4/10     Time  4    Period  Weeks    Status  Partially Met      PT LONG TERM GOAL #2   Title  Patient will be independant with self management of pain, posture and exercise to allow transition to home program     Baseline  patient has limited knowedge of appropriate exercise, progression without assistance and  cuing    Time  4    Period  Weeks    Status  On-going      PT LONG TERM GOAL #3   Title   Patient will reduce modified Oswestry score to <20 as to demonstrate minimal disability with ADLs including improved sleeping tolerance, walking/sitting tolerance etc for  better mobility with ADLs.     Baseline  4/16: 66% 5/16: 58%    Time  4    Period  Weeks    Status  Partially Met      PT LONG TERM GOAL #4   Title  Patient will increase lumbar extension strength to at least 4/5 as to improve gross strength for sitting/standing tolerance with better erect posture for increased tolerance with ADLs.     Baseline  2+/5 ; 516: 3/10 6/11: with UE support 4-/5    Time  4    Period  Weeks    Status  On-going    Target Date  07/13/17      PT LONG TERM GOAL #5   Title  Patient will perform 5x STS with CGA to increase functional independence and mobility.     Baseline  require Min A for single STS 6/11: 57 seconds with UE support     Time  4    Period  Weeks    Status  Achieved      Additional Long Term Goals   Additional Long Term Goals  Yes      PT LONG TERM GOAL #6   Title  Patient will increase BLE gross strength to 4+/5 as to improve functional strength for independent gait, increased standing tolerance and increased ADL ability.    Baseline  6/11: 3/5     Time  4    Period  Weeks    Status  New    Target Date  07/13/17      PT LONG TERM GOAL #7   Title  Patient will perform 5x STS < 20 seconds with CGA to increase functional independence and mobility.     Baseline  6/11: 57 seconds with UE support    Time  4    Period  Weeks    Status  New    Target Date  07/13/17      PT LONG TERM GOAL #8   Title  Patient will increase 10 meter walk test to >1.52ms as to improve gait speed for better community ambulation and to reduce fall risk.    Time  4    Period  Weeks    Status  New    Target Date  07/13/17            Plan - 06/21/17 1501    Clinical Impression Statement  Patient  requires occasional cues for keeping feet within walker with ambulation due to trunk flexion and shuffling of bilateral LEs. Gait improved with frequent verbal cues. Patient will continue to benefit from skilled physical therapy to improve pain and mobility    Rehab Potential  Fair    Clinical Impairments Affecting Rehab Potential  (+)motivated, family support (-) chronic condition, age, lymphedema left UE    PT Frequency  2x / week    PT Duration  4 weeks    PT Treatment/Interventions  Manual techniques;Patient/family education;Neuromuscular re-education;Therapeutic exercise;ADLs/Self Care Home Management;Cryotherapy;Electrical Stimulation;Iontophoresis 467mml Dexamethasone;Moist Heat;Traction;Ultrasound;Functional mobility training;Therapeutic activities;Balance training;Passive range of motion;Energy conservation    PT Next Visit Plan  LE strength, stability, balance    PT Home Exercise Plan  scapular retraction, ROM exercises for shoulders, posture awareness, correction    Consulted and Agree with Plan of Care  Patient       Patient will benefit from skilled therapeutic intervention in order to improve the following deficits and impairments:  Decreased strength, Pain, Impaired perceived functional ability, Increased muscle spasms,  Decreased range of motion, Abnormal gait, Decreased activity tolerance, Decreased balance, Decreased mobility, Difficulty walking, Hypomobility, Postural dysfunction, Improper body mechanics  Visit Diagnosis: Chronic bilateral low back pain, with sciatica presence unspecified  Muscle weakness (generalized)  Difficulty in walking, not elsewhere classified     Problem List Patient Active Problem List   Diagnosis Date Noted  . NSTEMI (non-ST elevated myocardial infarction) (Home) 02/04/2017  . Health care maintenance 07/30/2015  . Carcinoma of left breast ([place]) 05/26/2014  . Adult BMI 30+ 03/20/2014  . Morbid obesity ([place]) 03/20/2014  . Severe obesity (BMI  35.0-35.9 with comorbidity) ([place]) 03/20/2014  . Osteoarthritis of both knees 10/24/2013  . Focal lymphocytic colitis 10/11/2013  . Lymphocytic colitis 10/11/2013  . Impingement syndrome of shoulder 09/05/2013  . Impingement syndrome of left shoulder 09/05/2013  . Combined hyperlipidemia 07/08/2013  . HTN (hypertension), benign 07/08/2013  . Diabetes mellitus, type 2 ([place]) 07/08/2013  . Type 2 diabetes mellitus with stage 3 chronic kidney disease ([place]) 07/08/2013  . PA (pernicious anemia) 07/08/2013  . Osteoporosis 07/08/2013  . Tremor 07/08/2013  . Lumbar disc herniation 05/22/2013  . Lumbar paraspinal muscle spasm 05/22/2013  . Lumbar radiculitis 05/22/2013  . Lumbar stenosis with neurogenic claudication 05/22/2013   Janna Arch, PT, DPT   06/21/2017, 5:01 PM  [place] MAIN [facility] SERVICES [address] [place], [place], [address] Phone: [phone]   Fax:  [phone]  Name: Janet Elliott MRN: [id number] Date of Birth: [birth year]

## 2017-06-23 ENCOUNTER — Ambulatory Visit: Payer: Medicare Other

## 2017-06-23 DIAGNOSIS — M545 Low back pain: Secondary | ICD-10-CM | POA: Diagnosis not present

## 2017-06-23 DIAGNOSIS — R262 Difficulty in walking, not elsewhere classified: Secondary | ICD-10-CM

## 2017-06-23 DIAGNOSIS — G8929 Other chronic pain: Secondary | ICD-10-CM

## 2017-06-23 DIAGNOSIS — M6281 Muscle weakness (generalized): Secondary | ICD-10-CM

## 2017-06-23 NOTE — Therapy (Signed)
[place] MAIN [facility] SERVICES [address] [place], [place], [address] Phone: [phone]   Fax:  [phone]  Physical Therapy Treatment  Patient Details  Name: Janet Elliott MRN: [id number] Date of Birth: 1932/04/15 Referring Provider: Sharlet Salina, MD   Encounter Date: 06/23/2017  PT End of Session - 06/23/17 1439    Visit Number  18    Number of Visits  32    Date for PT Re-Evaluation  07/13/17    Authorization Type  3/10    PT Start Time  1430    PT Stop Time  1514    PT Time Calculation (min)  44 min    Equipment Utilized During Treatment  Gait belt    Activity Tolerance  Treatment limited secondary to medical complications (Comment);Patient tolerated treatment well    Behavior During Therapy  [facility] for tasks assessed/performed       Past Medical History:  Diagnosis Date  . Anxiety   . Arthritis    osteoarthritis  . Breast cancer ([place]) 2002   Left  chemo/radistion  . Carcinoma of left breast ([place])   . Lymphedema   . Sleep apnea     Past Surgical History:  Procedure Laterality Date  . BREAST BIOPSY Right 1980  . CORONARY ANGIOGRAPHY Left 02/05/2017   Procedure: CORONARY ANGIOGRAPHY;  Surgeon: Isaias Cowman, MD;  Location: [place] [facility];  Service: Cardiovascular;  Laterality: Left;  . CORONARY STENT INTERVENTION N/A 02/05/2017   Procedure: CORONARY STENT INTERVENTION;  Surgeon: Isaias Cowman, MD;  Location: [place] [facility];  Service: Cardiovascular;  Laterality: N/A;  . EYE SURGERY    . MASTECTOMY Left 2002    There were no vitals filed for this visit. Nustep lvl 4 4 minutes, cues for >60 rpm for cardiovascular    Ambulate 98 ft with RW and CGA, buckling of R knee occasionally with patient reporting instability. ; cues for keeping feet inside walker   Balloon balance game with no UE support CGA from PT, passes from SPT x2 minutes  Step over two consecutive half foam rollers in // bars 6x length of bars;  BUE support demonstrated improved step length with repetition.   Side step in // bars 4x length of bars ;BUE support   Standing marches in // bars 2x length of bars    6" step step up and down with same leg leading in // bars BUE support 8x each leg; fatigue at end of set     Pt. response to medical necessity: Patient will continue to benefit from skilled physical therapy to improve pain and mobility   Subjective Assessment - 06/23/17 1438    Subjective  Patient reports some soreness on distal end of right quadricep when walking/standing on it. reports doing some of her exercises at home.     Pertinent History  84 year old female with past medical history significant for left breast cancer status post mastectomy, chemoradiation currently in remission, hypertension, anxiety and sleep apnea presents with HNP (herniated nucleus pulposus) lumbar, lumbar stenosis with neurogenic claudication, and lumbar radiculitis.  Patient also has left arm lymphedema and uses a lymphedema pump and had NSTEMI on 02/04/17 resulting in left coronary angiography. Pt. has been followed by her doctor for chronic low-back pain for years with radiation into the bilateral buttock, lateral thigh and lateral aspect of the lower leg. Her symptoms have been more consistent with an L5 radiculitis. MRI dated 11/10/12 demonstrates at L5-S1 moderate degenerative disc disease and  moderate bilateral foraminal stenosis. At L4-5 there is a central and left paracentral disc herniation with impingement of the left L4 nerve root in the lateral recess. There is severe central stenosis. Her pain reaches moderate to severe intensity prior to injections.    Limitations  Lifting;Sitting;Standing;Walking;House hold activities    How long can you sit comfortably?  5 minutes    How long can you stand comfortably?  unable to stand independently    How long can you walk comfortably?  unable to stand independently    Diagnostic tests  MRI: MRI dated  11/10/12 demonstrates at L5-S1 moderate degenerative disc disease and moderate bilateral foraminal stenosis. At L4-5 there is a central and left paracentral disc herniation with impingement of the left L4 nerve root in the lateral recess. There is severe central stenosis.    Patient Stated Goals  less pain, increased mobility    Currently in Pain?  No/denies                               PT Education - 06/23/17 1439    Education provided  Yes    Education Details  exercise technique, ambulation     Person(s) Educated  Patient    Methods  Explanation;Demonstration;Verbal cues    Comprehension  Verbalized understanding;Returned demonstration       PT Short Term Goals - 06/15/17 1354      PT SHORT TERM GOAL #1   Title  Patient will be independent in home exercise program to improve strength/mobility for better functional independence with ADLs.    Baseline  performs HEP at home    Time  2    Period  Weeks    Status  Partially Met      PT SHORT TERM GOAL #2   Title  Patient will report a worst pain of 5/10 on VAS in low back to improve tolerance with ADLs and reduced symptoms with activities.     Baseline  7/10 sharp pain; 5/16: 4/10     Time  2    Period  Weeks    Status  Achieved      PT SHORT TERM GOAL #3   Title  Patient will increase BLE gross strength to 4/5 as to improve functional strength for independent gait, increased standing tolerance and increased ADL ability.    Baseline  6/11: 3+/5    Time  2    Period  Weeks    Status  New    Target Date  06/29/17        PT Long Term Goals - 06/15/17 1355      PT LONG TERM GOAL #1   Title  Patient will report a worst pain of 3/10 on VAS in low back to improve tolerance with ADLs and reduced symptoms with activities.     Baseline  7/10 pain; 5/16: 4/10     Time  4    Period  Weeks    Status  Partially Met      PT LONG TERM GOAL #2   Title  Patient will be independant with self management of pain,  posture and exercise to allow transition to home program     Baseline  patient has limited knowedge of appropriate exercise, progression without assistance and cuing    Time  4    Period  Weeks    Status  On-going      PT LONG TERM  GOAL #3   Title   Patient will reduce modified Oswestry score to <20 as to demonstrate minimal disability with ADLs including improved sleeping tolerance, walking/sitting tolerance etc for better mobility with ADLs.     Baseline  4/16: 66% 5/16: 58%    Time  4    Period  Weeks    Status  Partially Met      PT LONG TERM GOAL #4   Title  Patient will increase lumbar extension strength to at least 4/5 as to improve gross strength for sitting/standing tolerance with better erect posture for increased tolerance with ADLs.     Baseline  2+/5 ; 516: 3/10 6/11: with UE support 4-/5    Time  4    Period  Weeks    Status  On-going    Target Date  07/13/17      PT LONG TERM GOAL #5   Title  Patient will perform 5x STS with CGA to increase functional independence and mobility.     Baseline  require Min A for single STS 6/11: 57 seconds with UE support     Time  4    Period  Weeks    Status  Achieved      Additional Long Term Goals   Additional Long Term Goals  Yes      PT LONG TERM GOAL #6   Title  Patient will increase BLE gross strength to 4+/5 as to improve functional strength for independent gait, increased standing tolerance and increased ADL ability.    Baseline  6/11: 3/5     Time  4    Period  Weeks    Status  New    Target Date  07/13/17      PT LONG TERM GOAL #7   Title  Patient will perform 5x STS < 20 seconds with CGA to increase functional independence and mobility.     Baseline  6/11: 57 seconds with UE support    Time  4    Period  Weeks    Status  New    Target Date  07/13/17      PT LONG TERM GOAL #8   Title  Patient will increase 10 meter walk test to >1.61ms as to improve gait speed for better community ambulation and to reduce fall  risk.    Time  4    Period  Weeks    Status  New    Target Date  07/13/17            Plan - 06/23/17 1511    Clinical Impression Statement  Patient demonstrated ability to take longer step lengths bilaterally with use of visual cues and obstacles. Improved standing tolerance with dynamic interventions performed with decreased need for seated rest breaks. Patient will continue to benefit from skilled physical therapy to improve pain and mobility    Rehab Potential  Fair    Clinical Impairments Affecting Rehab Potential  (+)motivated, family support (-) chronic condition, age, lymphedema left UE    PT Frequency  2x / week    PT Duration  4 weeks    PT Treatment/Interventions  Manual techniques;Patient/family education;Neuromuscular re-education;Therapeutic exercise;ADLs/Self Care Home Management;Cryotherapy;Electrical Stimulation;Iontophoresis 485mml Dexamethasone;Moist Heat;Traction;Ultrasound;Functional mobility training;Therapeutic activities;Balance training;Passive range of motion;Energy conservation    PT Next Visit Plan  LE strength, stability, balance    PT Home Exercise Plan  scapular retraction, ROM exercises for shoulders, posture awareness, correction    Consulted and Agree with Plan of Care  Patient       Patient will benefit from skilled therapeutic intervention in order to improve the following deficits and impairments:  Decreased strength, Pain, Impaired perceived functional ability, Increased muscle spasms, Decreased range of motion, Abnormal gait, Decreased activity tolerance, Decreased balance, Decreased mobility, Difficulty walking, Hypomobility, Postural dysfunction, Improper body mechanics  Visit Diagnosis: Chronic bilateral low back pain, with sciatica presence unspecified  Muscle weakness (generalized)  Difficulty in walking, not elsewhere classified     Problem List Patient Active Problem List   Diagnosis Date Noted  . NSTEMI (non-ST elevated myocardial  infarction) ([place]) 02/04/2017  . Health care maintenance 07/30/2015  . Carcinoma of left breast ([place]) 05/26/2014  . Adult BMI 30+ 03/20/2014  . Morbid obesity ([place]) 03/20/2014  . Severe obesity (BMI 35.0-35.9 with comorbidity) ([place]) 03/20/2014  . Osteoarthritis of both knees 10/24/2013  . Focal lymphocytic colitis 10/11/2013  . Lymphocytic colitis 10/11/2013  . Impingement syndrome of shoulder 09/05/2013  . Impingement syndrome of left shoulder 09/05/2013  . Combined hyperlipidemia 07/08/2013  . HTN (hypertension), benign 07/08/2013  . Diabetes mellitus, type 2 ([place]) 07/08/2013  . Type 2 diabetes mellitus with stage 3 chronic kidney disease ([place]) 07/08/2013  . PA (pernicious anemia) 07/08/2013  . Osteoporosis 07/08/2013  . Tremor 07/08/2013  . Lumbar disc herniation 05/22/2013  . Lumbar paraspinal muscle spasm 05/22/2013  . Lumbar radiculitis 05/22/2013  . Lumbar stenosis with neurogenic claudication 05/22/2013   Janna Arch, PT, DPT   06/23/2017, 3:17 PM  [place] MAIN [facility] SERVICES [address] [place], [place], [address] Phone: [phone]   Fax:  [phone]  Name: DEEANNE DEININGER MRN: [id number] Date of Birth: [birth year]

## 2017-06-28 ENCOUNTER — Ambulatory Visit: Payer: Medicare Other

## 2017-06-28 DIAGNOSIS — R262 Difficulty in walking, not elsewhere classified: Secondary | ICD-10-CM

## 2017-06-28 DIAGNOSIS — G8929 Other chronic pain: Secondary | ICD-10-CM

## 2017-06-28 DIAGNOSIS — M545 Low back pain: Secondary | ICD-10-CM | POA: Diagnosis not present

## 2017-06-28 DIAGNOSIS — M6281 Muscle weakness (generalized): Secondary | ICD-10-CM

## 2017-06-28 NOTE — Therapy (Signed)
[place] MAIN [facility] SERVICES [address] [place], [place], [address] Phone: [phone]   Fax:  [phone]  Physical Therapy Treatment  Patient Details  Name: Janet Elliott MRN: [id number] Date of Birth: June 06, 1932 Referring Provider: Sharlet Salina, MD   Encounter Date: 06/28/2017  PT End of Session - 06/28/17 1357    Visit Number  19    Number of Visits  32    Date for PT Re-Evaluation  07/13/17    Authorization Type  4/10    PT Start Time  2951    PT Stop Time  1429    PT Time Calculation (min)  44 min    Equipment Utilized During Treatment  Gait belt    Activity Tolerance  Treatment limited secondary to medical complications (Comment);Patient tolerated treatment well    Behavior During Therapy  [facility] for tasks assessed/performed       Past Medical History:  Diagnosis Date  . Anxiety   . Arthritis    osteoarthritis  . Breast cancer ([place]) 2002   Left  chemo/radistion  . Carcinoma of left breast ([place])   . Lymphedema   . Sleep apnea     Past Surgical History:  Procedure Laterality Date  . BREAST BIOPSY Right 1980  . CORONARY ANGIOGRAPHY Left 02/05/2017   Procedure: CORONARY ANGIOGRAPHY;  Surgeon: Isaias Cowman, MD;  Location: [place] [facility];  Service: Cardiovascular;  Laterality: Left;  . CORONARY STENT INTERVENTION N/A 02/05/2017   Procedure: CORONARY STENT INTERVENTION;  Surgeon: Isaias Cowman, MD;  Location: [place] [facility];  Service: Cardiovascular;  Laterality: N/A;  . EYE SURGERY    . MASTECTOMY Left 2002    There were no vitals filed for this visit.  Subjective Assessment - 06/28/17 1351    Subjective  Patient reports being busy thursday. Did some walking, not much. No soreness or pain today.     Pertinent History  84 year old female with past medical history significant for left breast cancer status post mastectomy, chemoradiation currently in remission, hypertension, anxiety and sleep apnea presents  with HNP (herniated nucleus pulposus) lumbar, lumbar stenosis with neurogenic claudication, and lumbar radiculitis.  Patient also has left arm lymphedema and uses a lymphedema pump and had NSTEMI on 02/04/17 resulting in left coronary angiography. Pt. has been followed by her doctor for chronic low-back pain for years with radiation into the bilateral buttock, lateral thigh and lateral aspect of the lower leg. Her symptoms have been more consistent with an L5 radiculitis. MRI dated 11/10/12 demonstrates at L5-S1 moderate degenerative disc disease and moderate bilateral foraminal stenosis. At L4-5 there is a central and left paracentral disc herniation with impingement of the left L4 nerve root in the lateral recess. There is severe central stenosis. Her pain reaches moderate to severe intensity prior to injections.    Limitations  Lifting;Sitting;Standing;Walking;House hold activities    How long can you sit comfortably?  5 minutes    How long can you stand comfortably?  unable to stand independently    How long can you walk comfortably?  unable to stand independently    Diagnostic tests  MRI: MRI dated 11/10/12 demonstrates at L5-S1 moderate degenerative disc disease and moderate bilateral foraminal stenosis. At L4-5 there is a central and left paracentral disc herniation with impingement of the left L4 nerve root in the lateral recess. There is severe central stenosis.    Patient Stated Goals  less pain, increased mobility    Currently in Pain?  No/denies      Step over two consecutive half foam rollers in // bars 6x length of bars; BUE support demonstrated improved step length with repetition.    Seated hamstring stretch x60 seconds each leg    3lb ankle weights Standing marches in // bars 10x each leg  Standing hip extension step back 10x each leg  6" step step up and down with same leg leading in // bars BUE support 10x each leg; fatigue at end of set   6" side step up and down in // bars with  BUE support     Pt. response to medical necessity: Patient will continue to benefit from skilled physical therapy to improve pain and mobility                         PT Education - 06/28/17 1357    Education provided  Yes    Education Details  exercise technique, stability/mobility     Person(s) Educated  Patient    Methods  Explanation;Demonstration;Verbal cues    Comprehension  Verbalized understanding;Returned demonstration       PT Short Term Goals - 06/15/17 1354      PT SHORT TERM GOAL #1   Title  Patient will be independent in home exercise program to improve strength/mobility for better functional independence with ADLs.    Baseline  performs HEP at home    Time  2    Period  Weeks    Status  Partially Met      PT SHORT TERM GOAL #2   Title  Patient will report a worst pain of 5/10 on VAS in low back to improve tolerance with ADLs and reduced symptoms with activities.     Baseline  7/10 sharp pain; 5/16: 4/10     Time  2    Period  Weeks    Status  Achieved      PT SHORT TERM GOAL #3   Title  Patient will increase BLE gross strength to 4/5 as to improve functional strength for independent gait, increased standing tolerance and increased ADL ability.    Baseline  6/11: 3+/5    Time  2    Period  Weeks    Status  New    Target Date  06/29/17        PT Long Term Goals - 06/15/17 1355      PT LONG TERM GOAL #1   Title  Patient will report a worst pain of 3/10 on VAS in low back to improve tolerance with ADLs and reduced symptoms with activities.     Baseline  7/10 pain; 5/16: 4/10     Time  4    Period  Weeks    Status  Partially Met      PT LONG TERM GOAL #2   Title  Patient will be independant with self management of pain, posture and exercise to allow transition to home program     Baseline  patient has limited knowedge of appropriate exercise, progression without assistance and cuing    Time  4    Period  Weeks    Status  On-going       PT LONG TERM GOAL #3   Title   Patient will reduce modified Oswestry score to <20 as to demonstrate minimal disability with ADLs including improved sleeping tolerance, walking/sitting tolerance etc for better mobility with ADLs.     Baseline  4/16: 66% 5/16:  58%    Time  4    Period  Weeks    Status  Partially Met      PT LONG TERM GOAL #4   Title  Patient will increase lumbar extension strength to at least 4/5 as to improve gross strength for sitting/standing tolerance with better erect posture for increased tolerance with ADLs.     Baseline  2+/5 ; 516: 3/10 6/11: with UE support 4-/5    Time  4    Period  Weeks    Status  On-going    Target Date  07/13/17      PT LONG TERM GOAL #5   Title  Patient will perform 5x STS with CGA to increase functional independence and mobility.     Baseline  require Min A for single STS 6/11: 57 seconds with UE support     Time  4    Period  Weeks    Status  Achieved      Additional Long Term Goals   Additional Long Term Goals  Yes      PT LONG TERM GOAL #6   Title  Patient will increase BLE gross strength to 4+/5 as to improve functional strength for independent gait, increased standing tolerance and increased ADL ability.    Baseline  6/11: 3/5     Time  4    Period  Weeks    Status  New    Target Date  07/13/17      PT LONG TERM GOAL #7   Title  Patient will perform 5x STS < 20 seconds with CGA to increase functional independence and mobility.     Baseline  6/11: 57 seconds with UE support    Time  4    Period  Weeks    Status  New    Target Date  07/13/17      PT LONG TERM GOAL #8   Title  Patient will increase 10 meter walk test to >1.4ms as to improve gait speed for better community ambulation and to reduce fall risk.    Time  4    Period  Weeks    Status  New    Target Date  07/13/17            Plan - 06/29/17 0858    Clinical Impression Statement  Patient challenged with weighted marches, resulting in fatigue.  Improved ability to clear objects in standing position noted with heavy use of UE's for support. Hip extension limited bilaterally with compensatory trunk flexion. Patient will continue to benefit from skilled physical therapy to improve pain and mobility    Rehab Potential  Fair    Clinical Impairments Affecting Rehab Potential  (+)motivated, family support (-) chronic condition, age, lymphedema left UE    PT Frequency  2x / week    PT Duration  4 weeks    PT Treatment/Interventions  Manual techniques;Patient/family education;Neuromuscular re-education;Therapeutic exercise;ADLs/Self Care Home Management;Cryotherapy;Electrical Stimulation;Iontophoresis 464mml Dexamethasone;Moist Heat;Traction;Ultrasound;Functional mobility training;Therapeutic activities;Balance training;Passive range of motion;Energy conservation    PT Next Visit Plan  LE strength, stability, balance    PT Home Exercise Plan  scapular retraction, ROM exercises for shoulders, posture awareness, correction    Consulted and Agree with Plan of Care  Patient       Patient will benefit from skilled therapeutic intervention in order to improve the following deficits and impairments:  Decreased strength, Pain, Impaired perceived functional ability, Increased muscle spasms, Decreased range of motion, Abnormal gait, Decreased  activity tolerance, Decreased balance, Decreased mobility, Difficulty walking, Hypomobility, Postural dysfunction, Improper body mechanics  Visit Diagnosis: Chronic bilateral low back pain, with sciatica presence unspecified  Muscle weakness (generalized)  Difficulty in walking, not elsewhere classified     Problem List Patient Active Problem List   Diagnosis Date Noted  . NSTEMI (non-ST elevated myocardial infarction) ([place]) 02/04/2017  . Health care maintenance 07/30/2015  . Carcinoma of left breast ([place]) 05/26/2014  . Adult BMI 30+ 03/20/2014  . Morbid obesity ([place]) 03/20/2014  . Severe obesity (BMI  35.0-35.9 with comorbidity) ([place]) 03/20/2014  . Osteoarthritis of both knees 10/24/2013  . Focal lymphocytic colitis 10/11/2013  . Lymphocytic colitis 10/11/2013  . Impingement syndrome of shoulder 09/05/2013  . Impingement syndrome of left shoulder 09/05/2013  . Combined hyperlipidemia 07/08/2013  . HTN (hypertension), benign 07/08/2013  . Diabetes mellitus, type 2 ([place]) 07/08/2013  . Type 2 diabetes mellitus with stage 3 chronic kidney disease ([place]) 07/08/2013  . PA (pernicious anemia) 07/08/2013  . Osteoporosis 07/08/2013  . Tremor 07/08/2013  . Lumbar disc herniation 05/22/2013  . Lumbar paraspinal muscle spasm 05/22/2013  . Lumbar radiculitis 05/22/2013  . Lumbar stenosis with neurogenic claudication 05/22/2013   Janna Arch, PT, DPT   06/29/2017, 8:59 AM  [place] MAIN [facility] SERVICES [address] [place], [place], [address] Phone: [phone]   Fax:  [phone]  Name: Janet Elliott MRN: [id number] Date of Birth: [birth year]

## 2017-07-01 ENCOUNTER — Ambulatory Visit: Payer: Medicare Other

## 2017-07-01 DIAGNOSIS — M545 Low back pain: Secondary | ICD-10-CM | POA: Diagnosis not present

## 2017-07-01 DIAGNOSIS — G8929 Other chronic pain: Secondary | ICD-10-CM

## 2017-07-01 DIAGNOSIS — R262 Difficulty in walking, not elsewhere classified: Secondary | ICD-10-CM

## 2017-07-01 DIAGNOSIS — M6281 Muscle weakness (generalized): Secondary | ICD-10-CM

## 2017-07-01 NOTE — Therapy (Signed)
[place] MAIN [facility] SERVICES [address] [place], [place], [address] Phone: [phone]   Fax:  [phone]  Physical Therapy Treatment  Patient Details  Name: Janet Elliott MRN: [id number] Date of Birth: November 09, 1932 Referring Provider: Sharlet Salina, MD   Encounter Date: 07/01/2017  PT End of Session - 07/01/17 1356    Visit Number  20    Number of Visits  32    Date for PT Re-Evaluation  07/13/17    Authorization Type  5/10    PT Start Time  1345    PT Stop Time  1430    PT Time Calculation (min)  45 min    Equipment Utilized During Treatment  Gait belt    Activity Tolerance  Treatment limited secondary to medical complications (Comment);Patient tolerated treatment well    Behavior During Therapy  [facility] for tasks assessed/performed       Past Medical History:  Diagnosis Date  . Anxiety   . Arthritis    osteoarthritis  . Breast cancer ([place]) 2002   Left  chemo/radistion  . Carcinoma of left breast ([place])   . Lymphedema   . Sleep apnea     Past Surgical History:  Procedure Laterality Date  . BREAST BIOPSY Right 1980  . CORONARY ANGIOGRAPHY Left 02/05/2017   Procedure: CORONARY ANGIOGRAPHY;  Surgeon: Isaias Cowman, MD;  Location: [place] [facility];  Service: Cardiovascular;  Laterality: Left;  . CORONARY STENT INTERVENTION N/A 02/05/2017   Procedure: CORONARY STENT INTERVENTION;  Surgeon: Isaias Cowman, MD;  Location: [place] [facility];  Service: Cardiovascular;  Laterality: N/A;  . EYE SURGERY    . MASTECTOMY Left 2002    There were no vitals filed for this visit.  Subjective Assessment - 07/01/17 1354    Subjective  Patient and patients sister brought walker from home to be fitted. Patient's sister unsure of how high the hands should be.     Pertinent History  84 year old female with past medical history significant for left breast cancer status post mastectomy, chemoradiation currently in remission, hypertension,  anxiety and sleep apnea presents with HNP (herniated nucleus pulposus) lumbar, lumbar stenosis with neurogenic claudication, and lumbar radiculitis.  Patient also has left arm lymphedema and uses a lymphedema pump and had NSTEMI on 02/04/17 resulting in left coronary angiography. Pt. has been followed by her doctor for chronic low-back pain for years with radiation into the bilateral buttock, lateral thigh and lateral aspect of the lower leg. Her symptoms have been more consistent with an L5 radiculitis. MRI dated 11/10/12 demonstrates at L5-S1 moderate degenerative disc disease and moderate bilateral foraminal stenosis. At L4-5 there is a central and left paracentral disc herniation with impingement of the left L4 nerve root in the lateral recess. There is severe central stenosis. Her pain reaches moderate to severe intensity prior to injections.    Limitations  Lifting;Sitting;Standing;Walking;House hold activities    How long can you sit comfortably?  5 minutes    How long can you stand comfortably?  unable to stand independently    How long can you walk comfortably?  unable to stand independently    Diagnostic tests  MRI: MRI dated 11/10/12 demonstrates at L5-S1 moderate degenerative disc disease and moderate bilateral foraminal stenosis. At L4-5 there is a central and left paracentral disc herniation with impingement of the left L4 nerve root in the lateral recess. There is severe central stenosis.    Patient Stated Goals  less pain, increased mobility  Currently in Pain?  No/denies      Nustep Lvl 5 4 minutes RPM >60    Sit to stand from plinth table with RW   Ambulate 45 ft x2 with CGA and alternative RW heights to determine correct height needed.   Adjust patient's home walker to correct height for patient; patient inbetween two notches, adjusted to lower notch due to patient arm positioning and improved posture.   Coverroll and leukotape applied to R knee to give superior glide to patella  and maintain bony alignment.   Step over two consecutive half foam rollers in // bars 6x length of bars; BUE support demonstrated improved step length with repetition.    Seated hamstring stretch x60 seconds each leg    6" step step up and down with same leg leading in // bars BUE support 10x each leg; fatigue at end of set       Pt. response to medical necessity: Patient will continue to benefit from skilled physical therapy to improve pain and mobility                          PT Education - 07/01/17 1355    Education provided  Yes    Education Details  exercise technique, stability     Person(s) Educated  Patient    Methods  Explanation;Demonstration;Verbal cues    Comprehension  Verbalized understanding;Returned demonstration       PT Short Term Goals - 06/15/17 1354      PT SHORT TERM GOAL #1   Title  Patient will be independent in home exercise program to improve strength/mobility for better functional independence with ADLs.    Baseline  performs HEP at home    Time  2    Period  Weeks    Status  Partially Met      PT SHORT TERM GOAL #2   Title  Patient will report a worst pain of 5/10 on VAS in low back to improve tolerance with ADLs and reduced symptoms with activities.     Baseline  7/10 sharp pain; 5/16: 4/10     Time  2    Period  Weeks    Status  Achieved      PT SHORT TERM GOAL #3   Title  Patient will increase BLE gross strength to 4/5 as to improve functional strength for independent gait, increased standing tolerance and increased ADL ability.    Baseline  6/11: 3+/5    Time  2    Period  Weeks    Status  New    Target Date  06/29/17        PT Long Term Goals - 06/15/17 1355      PT LONG TERM GOAL #1   Title  Patient will report a worst pain of 3/10 on VAS in low back to improve tolerance with ADLs and reduced symptoms with activities.     Baseline  7/10 pain; 5/16: 4/10     Time  4    Period  Weeks    Status  Partially  Met      PT LONG TERM GOAL #2   Title  Patient will be independant with self management of pain, posture and exercise to allow transition to home program     Baseline  patient has limited knowedge of appropriate exercise, progression without assistance and cuing    Time  4    Period  Weeks  Status  On-going      PT LONG TERM GOAL #3   Title   Patient will reduce modified Oswestry score to <20 as to demonstrate minimal disability with ADLs including improved sleeping tolerance, walking/sitting tolerance etc for better mobility with ADLs.     Baseline  4/16: 66% 5/16: 58%    Time  4    Period  Weeks    Status  Partially Met      PT LONG TERM GOAL #4   Title  Patient will increase lumbar extension strength to at least 4/5 as to improve gross strength for sitting/standing tolerance with better erect posture for increased tolerance with ADLs.     Baseline  2+/5 ; 516: 3/10 6/11: with UE support 4-/5    Time  4    Period  Weeks    Status  On-going    Target Date  07/13/17      PT LONG TERM GOAL #5   Title  Patient will perform 5x STS with CGA to increase functional independence and mobility.     Baseline  require Min A for single STS 6/11: 57 seconds with UE support     Time  4    Period  Weeks    Status  Achieved      Additional Long Term Goals   Additional Long Term Goals  Yes      PT LONG TERM GOAL #6   Title  Patient will increase BLE gross strength to 4+/5 as to improve functional strength for independent gait, increased standing tolerance and increased ADL ability.    Baseline  6/11: 3/5     Time  4    Period  Weeks    Status  New    Target Date  07/13/17      PT LONG TERM GOAL #7   Title  Patient will perform 5x STS < 20 seconds with CGA to increase functional independence and mobility.     Baseline  6/11: 57 seconds with UE support    Time  4    Period  Weeks    Status  New    Target Date  07/13/17      PT LONG TERM GOAL #8   Title  Patient will increase 10  meter walk test to >1.29ms as to improve gait speed for better community ambulation and to reduce fall risk.    Time  4    Period  Weeks    Status  New    Target Date  07/13/17            Plan - 07/01/17 1440    Clinical Impression Statement  Patient brought in home walker for adjustment. Patient was inbetween two notches in standing position requiring ambulation analysis with patient ambulating at both heights. Patient demonstrated improved upright posture and step length with good arm positioning with lower notch position. R knee had occasional buckling which decreased with use of tape. Patient will continue to benefit from skilled physical therapy to improve pain and mobility    Rehab Potential  Fair    Clinical Impairments Affecting Rehab Potential  (+)motivated, family support (-) chronic condition, age, lymphedema left UE    PT Frequency  2x / week    PT Duration  4 weeks    PT Treatment/Interventions  Manual techniques;Patient/family education;Neuromuscular re-education;Therapeutic exercise;ADLs/Self Care Home Management;Cryotherapy;Electrical Stimulation;Iontophoresis 453mml Dexamethasone;Moist Heat;Traction;Ultrasound;Functional mobility training;Therapeutic activities;Balance training;Passive range of motion;Energy conservation    PT Next Visit Plan  LE strength, stability, balance    PT Home Exercise Plan  scapular retraction, ROM exercises for shoulders, posture awareness, correction    Consulted and Agree with Plan of Care  Patient       Patient will benefit from skilled therapeutic intervention in order to improve the following deficits and impairments:  Decreased strength, Pain, Impaired perceived functional ability, Increased muscle spasms, Decreased range of motion, Abnormal gait, Decreased activity tolerance, Decreased balance, Decreased mobility, Difficulty walking, Hypomobility, Postural dysfunction, Improper body mechanics  Visit Diagnosis: Chronic bilateral low back  pain, with sciatica presence unspecified  Muscle weakness (generalized)  Difficulty in walking, not elsewhere classified     Problem List Patient Active Problem List   Diagnosis Date Noted  . NSTEMI (non-ST elevated myocardial infarction) ([place]) 02/04/2017  . Health care maintenance 07/30/2015  . Carcinoma of left breast ([place]) 05/26/2014  . Adult BMI 30+ 03/20/2014  . Morbid obesity ([place]) 03/20/2014  . Severe obesity (BMI 35.0-35.9 with comorbidity) ([place]) 03/20/2014  . Osteoarthritis of both knees 10/24/2013  . Focal lymphocytic colitis 10/11/2013  . Lymphocytic colitis 10/11/2013  . Impingement syndrome of shoulder 09/05/2013  . Impingement syndrome of left shoulder 09/05/2013  . Combined hyperlipidemia 07/08/2013  . HTN (hypertension), benign 07/08/2013  . Diabetes mellitus, type 2 ([place]) 07/08/2013  . Type 2 diabetes mellitus with stage 3 chronic kidney disease ([place]) 07/08/2013  . PA (pernicious anemia) 07/08/2013  . Osteoporosis 07/08/2013  . Tremor 07/08/2013  . Lumbar disc herniation 05/22/2013  . Lumbar paraspinal muscle spasm 05/22/2013  . Lumbar radiculitis 05/22/2013  . Lumbar stenosis with neurogenic claudication 05/22/2013   Janna Arch, PT, DPT   07/01/2017, 2:41 PM  [place] MAIN [facility] SERVICES [address] [place], [place], [address] Phone: [phone]   Fax:  [phone]  Name: JULIAUNA STUEVE MRN: [id number] Date of Birth: [birth year]

## 2017-07-06 ENCOUNTER — Ambulatory Visit: Payer: Medicare Other | Attending: Physical Medicine and Rehabilitation

## 2017-07-06 DIAGNOSIS — G8929 Other chronic pain: Secondary | ICD-10-CM

## 2017-07-06 DIAGNOSIS — M545 Low back pain: Secondary | ICD-10-CM | POA: Insufficient documentation

## 2017-07-06 DIAGNOSIS — R262 Difficulty in walking, not elsewhere classified: Secondary | ICD-10-CM | POA: Insufficient documentation

## 2017-07-06 DIAGNOSIS — M6281 Muscle weakness (generalized): Secondary | ICD-10-CM | POA: Diagnosis present

## 2017-07-06 NOTE — Therapy (Signed)
[place] MAIN [facility] SERVICES [address] [place], [place], [address] Phone: [phone]   Fax:  [phone]  Physical Therapy Treatment  Patient Details  Name: Janet Elliott MRN: [id number] Date of Birth: 08-03-1932 Referring Provider: Sharlet Salina, MD   Encounter Date: 07/06/2017  PT End of Session - 07/06/17 1354    Visit Number  21    Number of Visits  32    Date for PT Re-Evaluation  07/13/17    Authorization Type  6/10    PT Start Time  0601    PT Stop Time  1429    PT Time Calculation (min)  44 min    Equipment Utilized During Treatment  Gait belt    Activity Tolerance  Treatment limited secondary to medical complications (Comment);Patient tolerated treatment well    Behavior During Therapy  [facility] for tasks assessed/performed       Past Medical History:  Diagnosis Date  . Anxiety   . Arthritis    osteoarthritis  . Breast cancer ([place]) 2002   Left  chemo/radistion  . Carcinoma of left breast ([place])   . Lymphedema   . Sleep apnea     Past Surgical History:  Procedure Laterality Date  . BREAST BIOPSY Right 1980  . CORONARY ANGIOGRAPHY Left 02/05/2017   Procedure: CORONARY ANGIOGRAPHY;  Surgeon: Isaias Cowman, MD;  Location: [place] [facility];  Service: Cardiovascular;  Laterality: Left;  . CORONARY STENT INTERVENTION N/A 02/05/2017   Procedure: CORONARY STENT INTERVENTION;  Surgeon: Isaias Cowman, MD;  Location: [place] [facility];  Service: Cardiovascular;  Laterality: N/A;  . EYE SURGERY    . MASTECTOMY Left 2002    There were no vitals filed for this visit.  Subjective Assessment - 07/06/17 1352    Subjective  Patient reports feeling fatigued this session due to overeating at lunch today. Reports doing HEP over the weekend.     Pertinent History  84 year old female with past medical history significant for left breast cancer status post mastectomy, chemoradiation currently in remission, hypertension, anxiety and  sleep apnea presents with HNP (herniated nucleus pulposus) lumbar, lumbar stenosis with neurogenic claudication, and lumbar radiculitis.  Patient also has left arm lymphedema and uses a lymphedema pump and had NSTEMI on 02/04/17 resulting in left coronary angiography. Pt. has been followed by her doctor for chronic low-back pain for years with radiation into the bilateral buttock, lateral thigh and lateral aspect of the lower leg. Her symptoms have been more consistent with an L5 radiculitis. MRI dated 11/10/12 demonstrates at L5-S1 moderate degenerative disc disease and moderate bilateral foraminal stenosis. At L4-5 there is a central and left paracentral disc herniation with impingement of the left L4 nerve root in the lateral recess. There is severe central stenosis. Her pain reaches moderate to severe intensity prior to injections.    Limitations  Lifting;Sitting;Standing;Walking;House hold activities    How long can you sit comfortably?  5 minutes    How long can you stand comfortably?  unable to stand independently    How long can you walk comfortably?  unable to stand independently    Diagnostic tests  MRI: MRI dated 11/10/12 demonstrates at L5-S1 moderate degenerative disc disease and moderate bilateral foraminal stenosis. At L4-5 there is a central and left paracentral disc herniation with impingement of the left L4 nerve root in the lateral recess. There is severe central stenosis.    Patient Stated Goals  less pain, increased mobility    Currently  in Pain?  No/denies      Nustep Lvl 5 3 minutes RPM >60 for cardiovascular challenge.    Seated hamstring stretch x60 seconds each leg   Standing rotation passing weighted ball (2000G) 10x   Standing toy soldiers in bars BUE support 10x each leg. ; got dizzy at end of last rep.    4lb ankle weights  Seated marches 20x each leg  LAQ 3 second holds 10x each leg.   Standing marches in // bars 10x each leg  Standing hip extension step back 10x  each leg  Standing balloon taps in // bars for stability x 2 minutes   Seated adduction squeeze 10x 5 seconds holds   Walk backwards in // bars one length, cues for upright posture     Pt. response to medical necessity: Patient will continue to benefit from skilled physical therapy to improve pain and mobility                       PT Education - 07/06/17 1353    Education provided  Yes    Education Details  exercise technique, stability     Person(s) Educated  Patient    Methods  Explanation;Demonstration;Verbal cues    Comprehension  Verbalized understanding;Returned demonstration       PT Short Term Goals - 06/15/17 1354      PT SHORT TERM GOAL #1   Title  Patient will be independent in home exercise program to improve strength/mobility for better functional independence with ADLs.    Baseline  performs HEP at home    Time  2    Period  Weeks    Status  Partially Met      PT SHORT TERM GOAL #2   Title  Patient will report a worst pain of 5/10 on VAS in low back to improve tolerance with ADLs and reduced symptoms with activities.     Baseline  7/10 sharp pain; 5/16: 4/10     Time  2    Period  Weeks    Status  Achieved      PT SHORT TERM GOAL #3   Title  Patient will increase BLE gross strength to 4/5 as to improve functional strength for independent gait, increased standing tolerance and increased ADL ability.    Baseline  6/11: 3+/5    Time  2    Period  Weeks    Status  New    Target Date  06/29/17        PT Long Term Goals - 06/15/17 1355      PT LONG TERM GOAL #1   Title  Patient will report a worst pain of 3/10 on VAS in low back to improve tolerance with ADLs and reduced symptoms with activities.     Baseline  7/10 pain; 5/16: 4/10     Time  4    Period  Weeks    Status  Partially Met      PT LONG TERM GOAL #2   Title  Patient will be independant with self management of pain, posture and exercise to allow transition to home  program     Baseline  patient has limited knowedge of appropriate exercise, progression without assistance and cuing    Time  4    Period  Weeks    Status  On-going      PT LONG TERM GOAL #3   Title   Patient will reduce modified Oswestry score  to <20 as to demonstrate minimal disability with ADLs including improved sleeping tolerance, walking/sitting tolerance etc for better mobility with ADLs.     Baseline  4/16: 66% 5/16: 58%    Time  4    Period  Weeks    Status  Partially Met      PT LONG TERM GOAL #4   Title  Patient will increase lumbar extension strength to at least 4/5 as to improve gross strength for sitting/standing tolerance with better erect posture for increased tolerance with ADLs.     Baseline  2+/5 ; 516: 3/10 6/11: with UE support 4-/5    Time  4    Period  Weeks    Status  On-going    Target Date  07/13/17      PT LONG TERM GOAL #5   Title  Patient will perform 5x STS with CGA to increase functional independence and mobility.     Baseline  require Min A for single STS 6/11: 57 seconds with UE support     Time  4    Period  Weeks    Status  Achieved      Additional Long Term Goals   Additional Long Term Goals  Yes      PT LONG TERM GOAL #6   Title  Patient will increase BLE gross strength to 4+/5 as to improve functional strength for independent gait, increased standing tolerance and increased ADL ability.    Baseline  6/11: 3/5     Time  4    Period  Weeks    Status  New    Target Date  07/13/17      PT LONG TERM GOAL #7   Title  Patient will perform 5x STS < 20 seconds with CGA to increase functional independence and mobility.     Baseline  6/11: 57 seconds with UE support    Time  4    Period  Weeks    Status  New    Target Date  07/13/17      PT LONG TERM GOAL #8   Title  Patient will increase 10 meter walk test to >1.51ms as to improve gait speed for better community ambulation and to reduce fall risk.    Time  4    Period  Weeks    Status   New    Target Date  07/13/17            Plan - 07/06/17 1415    Clinical Impression Statement   Patient had occasional dizziness in standing today but reports that it is normal for her. Bilateral knee buckling occurs with fatigue. Increased strength of LE noted with weight increase of ankle weights. Stability with SUE improving with reaching outside and inside BOS for balloon. Patient will continue to benefit from skilled physical therapy to improve pain and mobility    Rehab Potential  Fair    Clinical Impairments Affecting Rehab Potential  (+)motivated, family support (-) chronic condition, age, lymphedema left UE    PT Frequency  2x / week    PT Duration  4 weeks    PT Treatment/Interventions  Manual techniques;Patient/family education;Neuromuscular re-education;Therapeutic exercise;ADLs/Self Care Home Management;Cryotherapy;Electrical Stimulation;Iontophoresis 454mml Dexamethasone;Moist Heat;Traction;Ultrasound;Functional mobility training;Therapeutic activities;Balance training;Passive range of motion;Energy conservation    PT Next Visit Plan  LE strength, stability, balance    PT Home Exercise Plan  scapular retraction, ROM exercises for shoulders, posture awareness, correction    Consulted and Agree with Plan of  Care  Patient       Patient will benefit from skilled therapeutic intervention in order to improve the following deficits and impairments:  Decreased strength, Pain, Impaired perceived functional ability, Increased muscle spasms, Decreased range of motion, Abnormal gait, Decreased activity tolerance, Decreased balance, Decreased mobility, Difficulty walking, Hypomobility, Postural dysfunction, Improper body mechanics  Visit Diagnosis: Chronic bilateral low back pain, with sciatica presence unspecified  Muscle weakness (generalized)  Difficulty in walking, not elsewhere classified     Problem List Patient Active Problem List   Diagnosis Date Noted  . NSTEMI (non-ST  elevated myocardial infarction) ([place]) 02/04/2017  . Health care maintenance 07/30/2015  . Carcinoma of left breast ([place]) 05/26/2014  . Adult BMI 30+ 03/20/2014  . Morbid obesity ([place]) 03/20/2014  . Severe obesity (BMI 35.0-35.9 with comorbidity) ([place]) 03/20/2014  . Osteoarthritis of both knees 10/24/2013  . Focal lymphocytic colitis 10/11/2013  . Lymphocytic colitis 10/11/2013  . Impingement syndrome of shoulder 09/05/2013  . Impingement syndrome of left shoulder 09/05/2013  . Combined hyperlipidemia 07/08/2013  . HTN (hypertension), benign 07/08/2013  . Diabetes mellitus, type 2 ([place]) 07/08/2013  . Type 2 diabetes mellitus with stage 3 chronic kidney disease ([place]) 07/08/2013  . PA (pernicious anemia) 07/08/2013  . Osteoporosis 07/08/2013  . Tremor 07/08/2013  . Lumbar disc herniation 05/22/2013  . Lumbar paraspinal muscle spasm 05/22/2013  . Lumbar radiculitis 05/22/2013  . Lumbar stenosis with neurogenic claudication 05/22/2013   Janna Arch, PT, DPT   07/06/2017, 2:33 PM  [place] MAIN [facility] SERVICES [address] [place], [place], [address] Phone: [phone]   Fax:  [phone]  Name: Janet Elliott MRN: [id number] Date of Birth: [birth year]

## 2017-07-13 ENCOUNTER — Ambulatory Visit: Payer: Medicare Other

## 2017-07-13 DIAGNOSIS — R262 Difficulty in walking, not elsewhere classified: Secondary | ICD-10-CM

## 2017-07-13 DIAGNOSIS — G8929 Other chronic pain: Secondary | ICD-10-CM

## 2017-07-13 DIAGNOSIS — M545 Low back pain: Principal | ICD-10-CM

## 2017-07-13 DIAGNOSIS — M6281 Muscle weakness (generalized): Secondary | ICD-10-CM

## 2017-07-13 NOTE — Therapy (Signed)
[place] MAIN [facility] SERVICES [address] [place], [place], [address] Phone: [phone]   Fax:  [phone]  Physical Therapy Treatment  Patient Details  Name: TYERRA LORETTO MRN: [id number] Date of Birth: [birth year] Referring Provider: Sharlet Salina, MD   Encounter Date: 07/13/2017  PT End of Session - 07/13/17 1625    Visit Number  22    Number of Visits  32    Date for PT Re-Evaluation  07/13/17    Authorization Type  7/10    PT Start Time  1315    PT Stop Time  1408    PT Time Calculation (min)  53 min    Equipment Utilized During Treatment  Gait belt    Activity Tolerance  Treatment limited secondary to medical complications (Comment);Patient tolerated treatment well    Behavior During Therapy  [facility] for tasks assessed/performed       Past Medical History:  Diagnosis Date  . Anxiety   . Arthritis    osteoarthritis  . Breast cancer ([place]) 2002   Left  chemo/radistion  . Carcinoma of left breast ([place])   . Lymphedema   . Sleep apnea     Past Surgical History:  Procedure Laterality Date  . BREAST BIOPSY Right 1980  . CORONARY ANGIOGRAPHY Left 02/05/2017   Procedure: CORONARY ANGIOGRAPHY;  Surgeon: Isaias Cowman, MD;  Location: [place] [facility];  Service: Cardiovascular;  Laterality: Left;  . CORONARY STENT INTERVENTION N/A 02/05/2017   Procedure: CORONARY STENT INTERVENTION;  Surgeon: Isaias Cowman, MD;  Location: [place] [facility];  Service: Cardiovascular;  Laterality: N/A;  . EYE SURGERY    . MASTECTOMY Left 2002    There were no vitals filed for this visit.  Subjective Assessment - 07/13/17 1621    Subjective  Patient reports she is done with physical therapy and would like to do something different like forever fit.     Pertinent History  84 year old female with past medical history significant for left breast cancer status post mastectomy, chemoradiation currently in remission, hypertension, anxiety and sleep  apnea presents with HNP (herniated nucleus pulposus) lumbar, lumbar stenosis with neurogenic claudication, and lumbar radiculitis.  Patient also has left arm lymphedema and uses a lymphedema pump and had NSTEMI on 02/04/17 resulting in left coronary angiography. Pt. has been followed by her doctor for chronic low-back pain for years with radiation into the bilateral buttock, lateral thigh and lateral aspect of the lower leg. Her symptoms have been more consistent with an L5 radiculitis. MRI dated 11/10/12 demonstrates at L5-S1 moderate degenerative disc disease and moderate bilateral foraminal stenosis. At L4-5 there is a central and left paracentral disc herniation with impingement of the left L4 nerve root in the lateral recess. There is severe central stenosis. Her pain reaches moderate to severe intensity prior to injections.    Limitations  Lifting;Sitting;Standing;Walking;House hold activities    How long can you sit comfortably?  5 minutes    How long can you stand comfortably?  unable to stand independently    How long can you walk comfortably?  unable to stand independently    Diagnostic tests  MRI: MRI dated 11/10/12 demonstrates at L5-S1 moderate degenerative disc disease and moderate bilateral foraminal stenosis. At L4-5 there is a central and left paracentral disc herniation with impingement of the left L4 nerve root in the lateral recess. There is severe central stenosis.    Patient Stated Goals  less pain, increased mobility    Currently  in Pain?  No/denies      Review goals:   10 MWT=    BLE strength: 4/5 when focused on strength testing task.    5xx STS : 37 seconds with hands on knees  10MWT= .28 m/s with RW  Ambulate 4x 40 ft with RW and CGA. Cues for keeping feet closer to RW, able to maintain upright posture without cueing.   Sit to stand from low surface without using chair to recreate home and environment setting.   Education on donning/doffing brace on R knee. Improved  step length and weight shift onto RLE with brace donned.   Education on [employer]. Education on need to continue to exercise after physical therapy d/c.                 PT Education - 07/13/17 1622    Education provided  Yes    Education Details  POC, forever fit, goals, knee brace    Person(s) Educated  Patient    Methods  Explanation;Demonstration;Verbal cues    Comprehension  Verbalized understanding;Returned demonstration       PT Short Term Goals - 07/13/17 1626      PT SHORT TERM GOAL #1   Title  Patient will be independent in home exercise program to improve strength/mobility for better functional independence with ADLs.    Baseline  performs HEP at home    Time  2    Period  Weeks    Status  Achieved      PT SHORT TERM GOAL #2   Title  Patient will report a worst pain of 5/10 on VAS in low back to improve tolerance with ADLs and reduced symptoms with activities.     Baseline  7/10 sharp pain; 5/16: 4/10     Time  2    Period  Weeks    Status  Achieved      PT SHORT TERM GOAL #3   Title  Patient will increase BLE gross strength to 4/5 as to improve functional strength for independent gait, increased standing tolerance and increased ADL ability.    Baseline  6/11: 3+/5 7/9: 4/5    Time  2    Period  Weeks    Status  Achieved        PT Long Term Goals - 07/13/17 1627      PT LONG TERM GOAL #1   Title  Patient will report a worst pain of 3/10 on VAS in low back to improve tolerance with ADLs and reduced symptoms with activities.     Baseline  7/10 pain; 5/16: 4/10  7/9: reports no pain    Time  4    Period  Weeks    Status  Achieved      PT LONG TERM GOAL #2   Title  Patient will be independant with self management of pain, posture and exercise to allow transition to home program     Baseline  reports compliance with HEP     Time  4    Period  Weeks    Status  Achieved      PT LONG TERM GOAL #3   Title   Patient will reduce modified  Oswestry score to <20 as to demonstrate minimal disability with ADLs including improved sleeping tolerance, walking/sitting tolerance etc for better mobility with ADLs.     Baseline  4/16: 66% 5/16: 58%    Time  4    Period  Weeks  Status  Partially Met      PT LONG TERM GOAL #4   Title  Patient will increase lumbar extension strength to at least 4/5 as to improve gross strength for sitting/standing tolerance with better erect posture for increased tolerance with ADLs.     Baseline  2+/5 ; 516: 3/10 6/11: with UE support 4-/5 7/9: 4-/5     Time  4    Period  Weeks    Status  On-going      PT LONG TERM GOAL #5   Title  Patient will perform 5x STS with CGA to increase functional independence and mobility.     Baseline  require Min A for single STS 6/11: 57 seconds with UE support ;     Time  4    Period  Weeks    Status  Achieved      PT LONG TERM GOAL #6   Title  Patient will increase BLE gross strength to 4+/5 as to improve functional strength for independent gait, increased standing tolerance and increased ADL ability.    Baseline  6/11: 3/5  7/9: 4/5    Time  4    Period  Weeks    Status  Partially Met      PT LONG TERM GOAL #7   Title  Patient will perform 5x STS < 20 seconds with CGA to increase functional independence and mobility.     Baseline  6/11: 57 seconds with UE support; 7/9: 7/9: 37 seconds with hands on knees    Time  4    Period  Weeks    Status  Partially Met      PT LONG TERM GOAL #8   Title  Patient will increase 10 meter walk test to >1.65ms as to improve gait speed for better community ambulation and to reduce fall risk.    Baseline  7/9: .28 with RW     Time  4    Period  Weeks    Status  On-going            Plan - 07/13/17 1634    Clinical Impression Statement  Patient demonstrated progression towards mobility goals and pain levels. Due to patient preference physical therapy will be discontinued and patient will be referred to forever fit to  continue mobility and fitness. I will be happy to see this patient again in the future as needed.     Rehab Potential  Fair    Clinical Impairments Affecting Rehab Potential  (+)motivated, family support (-) chronic condition, age, lymphedema left UE    PT Frequency  2x / week    PT Duration  4 weeks    PT Treatment/Interventions  Manual techniques;Patient/family education;Neuromuscular re-education;Therapeutic exercise;ADLs/Self Care Home Management;Cryotherapy;Electrical Stimulation;Iontophoresis 470mml Dexamethasone;Moist Heat;Traction;Ultrasound;Functional mobility training;Therapeutic activities;Balance training;Passive range of motion;Energy conservation    PT Next Visit Plan  LE strength, stability, balance    PT Home Exercise Plan  scapular retraction, ROM exercises for shoulders, posture awareness, correction    Consulted and Agree with Plan of Care  Patient       Patient will benefit from skilled therapeutic intervention in order to improve the following deficits and impairments:  Decreased strength, Pain, Impaired perceived functional ability, Increased muscle spasms, Decreased range of motion, Abnormal gait, Decreased activity tolerance, Decreased balance, Decreased mobility, Difficulty walking, Hypomobility, Postural dysfunction, Improper body mechanics  Visit Diagnosis: Chronic bilateral low back pain, with sciatica presence unspecified  Muscle weakness (generalized)  Difficulty in walking, not  elsewhere classified     Problem List Patient Active Problem List   Diagnosis Date Noted  . NSTEMI (non-ST elevated myocardial infarction) ([place]) 02/04/2017  . Health care maintenance 07/30/2015  . Carcinoma of left breast ([place]) 05/26/2014  . Adult BMI 30+ 03/20/2014  . Morbid obesity ([place]) 03/20/2014  . Severe obesity (BMI 35.0-35.9 with comorbidity) ([place]) 03/20/2014  . Osteoarthritis of both knees 10/24/2013  . Focal lymphocytic colitis 10/11/2013  . Lymphocytic colitis  10/11/2013  . Impingement syndrome of shoulder 09/05/2013  . Impingement syndrome of left shoulder 09/05/2013  . Combined hyperlipidemia 07/08/2013  . HTN (hypertension), benign 07/08/2013  . Diabetes mellitus, type 2 ([place]) 07/08/2013  . Type 2 diabetes mellitus with stage 3 chronic kidney disease ([place]) 07/08/2013  . PA (pernicious anemia) 07/08/2013  . Osteoporosis 07/08/2013  . Tremor 07/08/2013  . Lumbar disc herniation 05/22/2013  . Lumbar paraspinal muscle spasm 05/22/2013  . Lumbar radiculitis 05/22/2013  . Lumbar stenosis with neurogenic claudication 05/22/2013   Janna Arch, PT, DPT   07/13/2017, 4:38 PM  [place] MAIN [facility] SERVICES [address] [place], [place], [address] Phone: [phone]   Fax:  [phone]  Name: AFTEN LIPSEY MRN: [id number] Date of Birth: 1932/04/05

## 2017-07-15 ENCOUNTER — Ambulatory Visit: Payer: Medicare Other

## 2017-07-20 ENCOUNTER — Ambulatory Visit: Payer: Medicare Other

## 2017-07-22 ENCOUNTER — Ambulatory Visit: Payer: Medicare Other

## 2017-08-05 ENCOUNTER — Ambulatory Visit: Payer: Medicare Other

## 2017-09-23 IMAGING — MR MR HEAD W/O CM
10 series · 48 of 48 positions shown · non-contrast
Comparison: None.

CLINICAL DATA: Cerebellar tremor.  Frequent falls.

EXAM:
MRI HEAD WITHOUT CONTRAST
TECHNIQUE: Multiplanar, multiecho pulse sequences of the brain and surrounding
structures were obtained without intravenous contrast.

[Series 2: T1 · sagittal · 5.0mm · 0.45mm/px · 4 of 27 slices shown (1 of 2)]
[im 1/27]
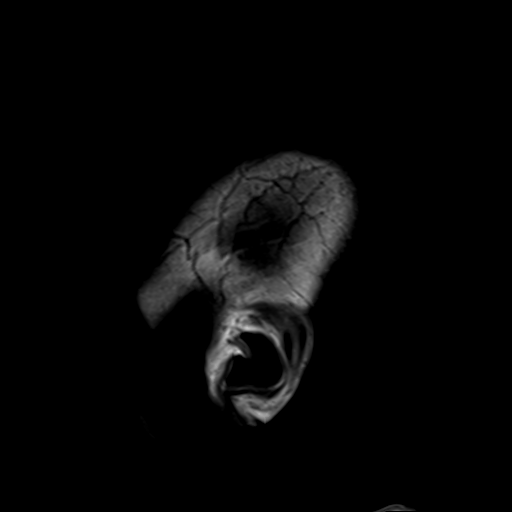
[im 9/27]
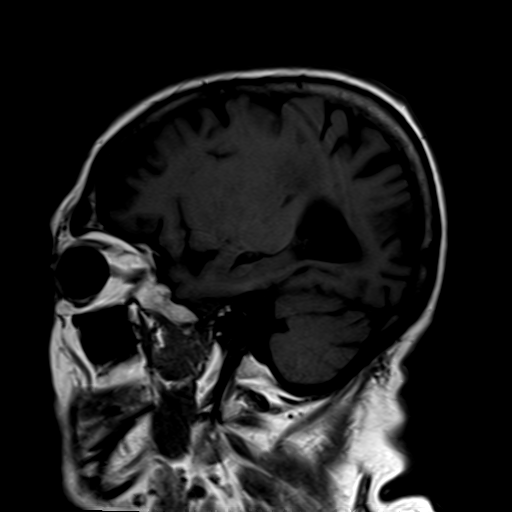
[im 18/27]
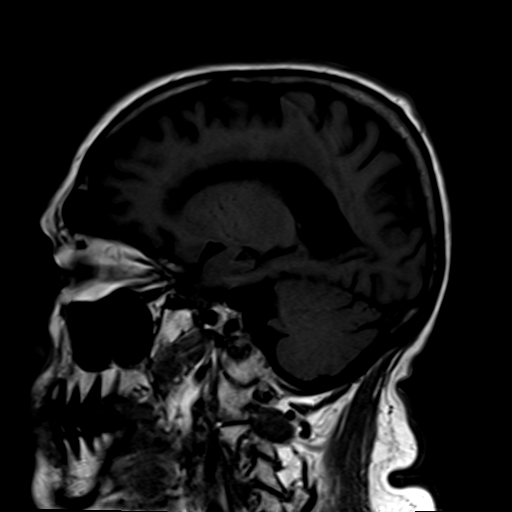
[im 27/27]
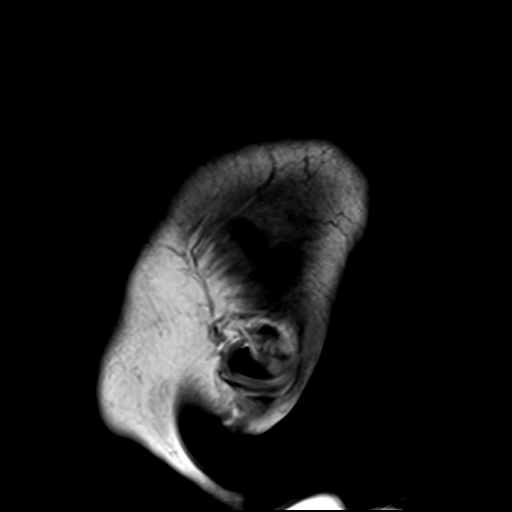

[Series 7: T2 · axial · 5.0mm · 0.60mm/px · z∈[-50,+105]mm · 4 of 25 slices shown (1 of 3)]
[im 1/25]
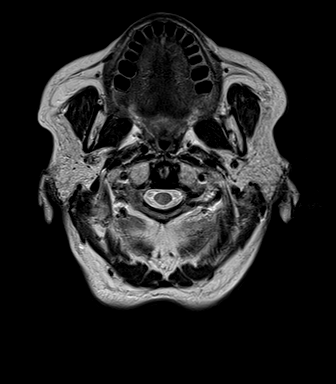
[im 9/25]
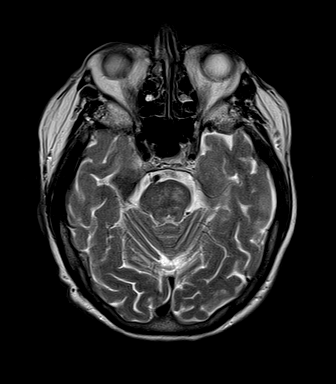
[im 17/25]
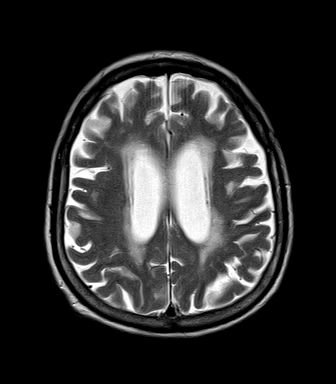
[im 25/25]
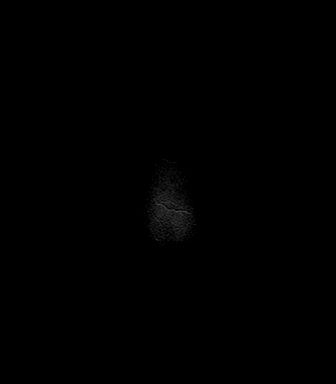

[Series 8: FLAIR · axial · 5.0mm · 0.45mm/px · z∈[-50,+105]mm · 3 of 25 slices shown]
[im 1/25]
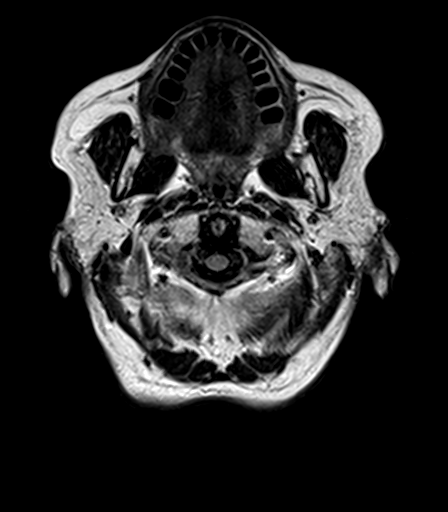
[im 13/25]
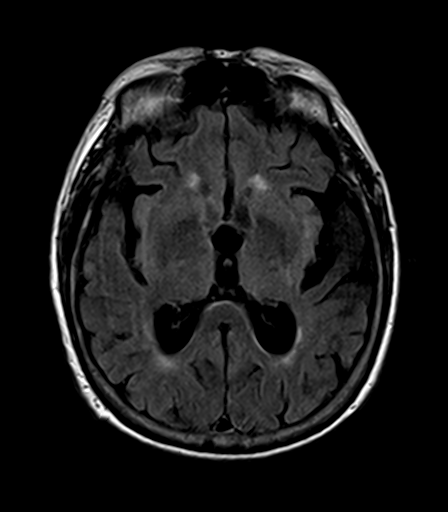
[im 25/25]
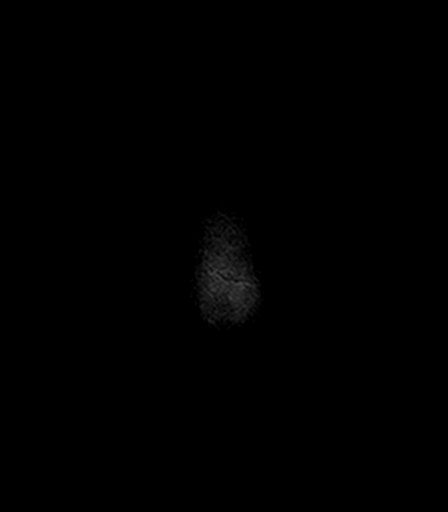

[Series 9: T2 · axial · 5.0mm · 0.45mm/px · z∈[-50,+105]mm · 3 of 25 slices shown (2 of 3)]
[im 1/25]
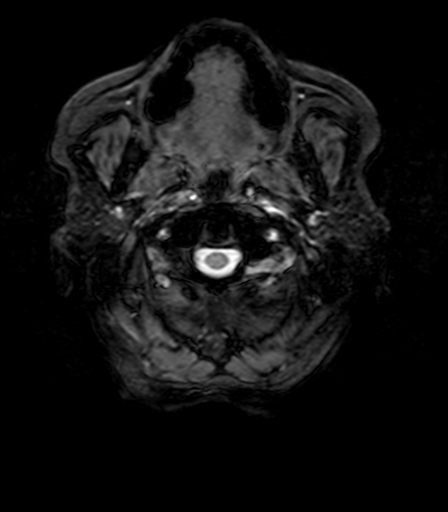
[im 13/25]
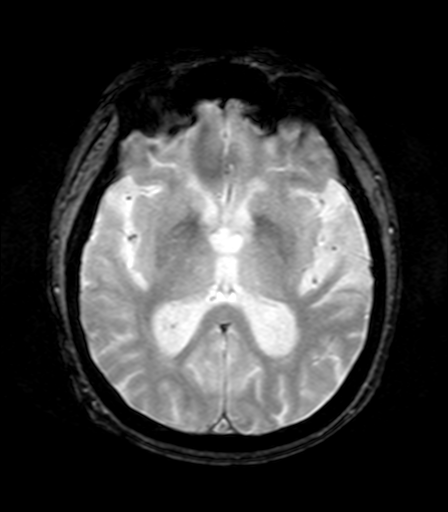
[im 25/25]
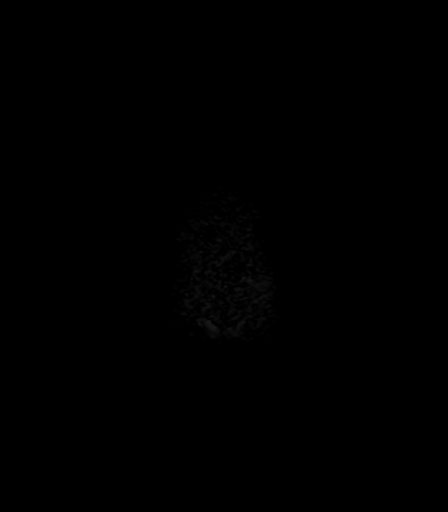

[Series 10: T1 · axial · 3.0mm · 1.00mm/px · z∈[-55,+110]mm · 7 of 56 slices shown (2 of 2)]
[im 1/56]
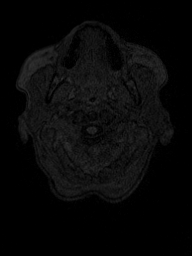
[im 10/56]
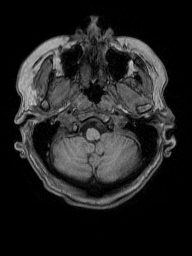
[im 19/56]
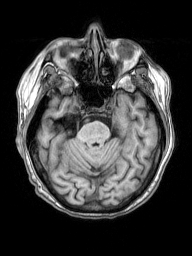
[im 28/56]
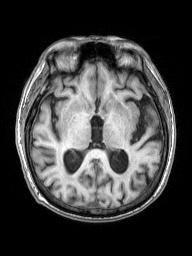
[im 37/56]
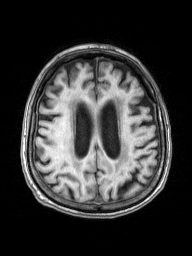
[im 46/56]
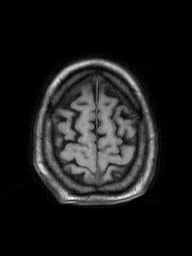
[im 56/56]
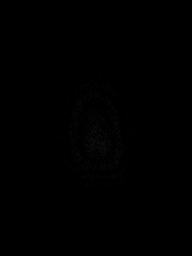

[Series 11: T2 · coronal · 5.0mm · 0.49mm/px · 3 of 27 slices shown (3 of 3)]
[im 1/27]
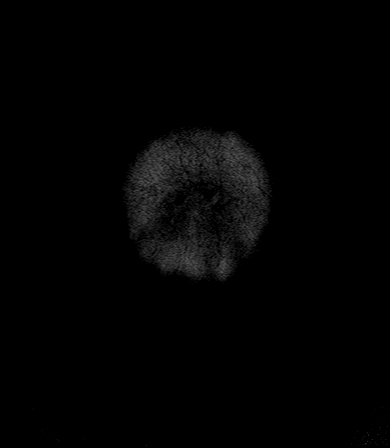
[im 14/27]
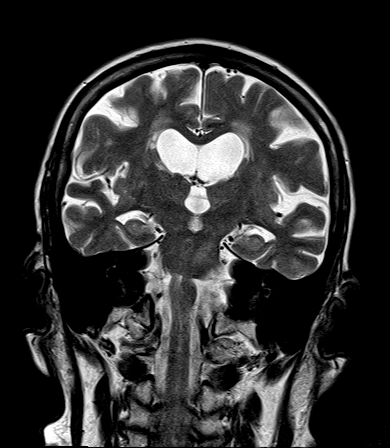
[im 27/27]
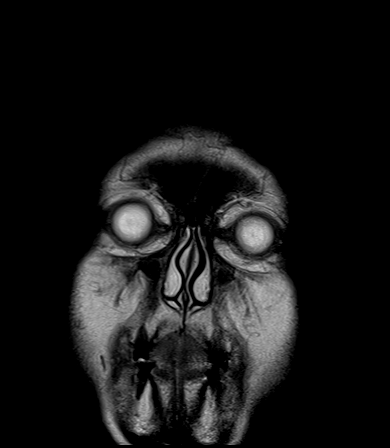

[Series 100: DWI · axial · 3.0mm · 1.80mm/px · z∈[-39,+107]mm · 6 of 50 slices shown (1 of 2)]
[im 1/50]
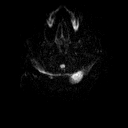
[im 10/50]
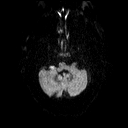
[im 20/50]
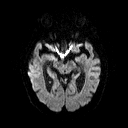
[im 30/50]
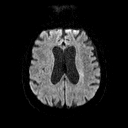
[im 40/50]
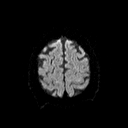
[im 50/50]
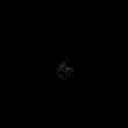

[Series 101: ADC · axial · 3.0mm · 1.80mm/px · z∈[-54,+107]mm · 7 of 54 slices shown (1 of 2)]
[im 1/54]
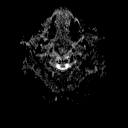
[im 9/54]
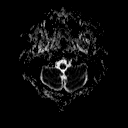
[im 18/54]
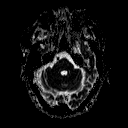
[im 27/54]
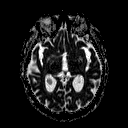
[im 36/54]
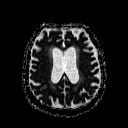
[im 45/54]
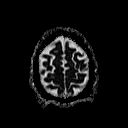
[im 54/54]
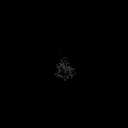

[Series 102: DWI · coronal · 3.0mm · 1.80mm/px · 5 of 43 slices shown (2 of 2)]
[im 1/43]
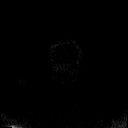
[im 11/43]
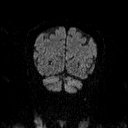
[im 22/43]
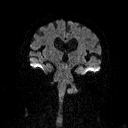
[im 32/43]
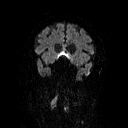
[im 43/43]
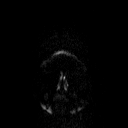

[Series 103: ADC · coronal · 3.0mm · 1.80mm/px · 6 of 45 slices shown (2 of 2)]
[im 1/45]
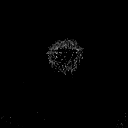
[im 9/45]
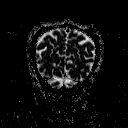
[im 18/45]
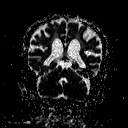
[im 27/45]
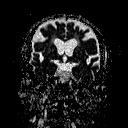
[im 36/45]
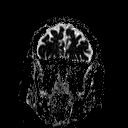
[im 45/45]
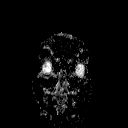

[48 of 48 positions shown; findings below may reference images not displayed]

FINDINGS: Moderate atrophy. Ventricular enlargement commensurate with atrophy.

Negative for acute infarct. Moderate chronic microvascular ischemic
change in the white matter and pons. No cortical infarct.

Negative for intracranial hemorrhage or fluid collection.

Negative for mass or edema.  No shift of the midline structures.

Cerebellum normal in signal and volume.

Pituitary and skull base normal.

Paranasal sinuses clear. Normal orbit bilaterally. Bilateral lens
replacement.
IMPRESSION: Cerebral atrophy and chronic microvascular ischemia. No acute
abnormality.

## 2017-10-20 ENCOUNTER — Other Ambulatory Visit: Payer: Self-pay

## 2017-10-20 ENCOUNTER — Other Ambulatory Visit: Payer: Self-pay | Admitting: Physical Medicine and Rehabilitation

## 2017-10-20 DIAGNOSIS — M5416 Radiculopathy, lumbar region: Secondary | ICD-10-CM

## 2017-11-08 ENCOUNTER — Ambulatory Visit
Admission: RE | Admit: 2017-11-08 | Discharge: 2017-11-08 | Disposition: A | Discharge status: Home / Self Care | Payer: Medicare Other | Source: Ambulatory Visit | Attending: Physical Medicine and Rehabilitation | Admitting: Physical Medicine and Rehabilitation

## 2017-11-08 DIAGNOSIS — M5416 Radiculopathy, lumbar region: Secondary | ICD-10-CM

## 2017-11-08 DIAGNOSIS — M48061 Spinal stenosis, lumbar region without neurogenic claudication: Secondary | ICD-10-CM | POA: Diagnosis not present

## 2017-11-08 DIAGNOSIS — M4316 Spondylolisthesis, lumbar region: Secondary | ICD-10-CM | POA: Insufficient documentation

## 2017-11-08 DIAGNOSIS — M5117 Intervertebral disc disorders with radiculopathy, lumbosacral region: Secondary | ICD-10-CM | POA: Insufficient documentation

## 2018-09-21 ENCOUNTER — Other Ambulatory Visit: Payer: Self-pay | Admitting: Internal Medicine

## 2018-09-21 DIAGNOSIS — Z1231 Encounter for screening mammogram for malignant neoplasm of breast: Secondary | ICD-10-CM

## 2018-11-01 ENCOUNTER — Ambulatory Visit
Admission: RE | Admit: 2018-11-01 | Discharge: 2018-11-01 | Disposition: A | Discharge status: Home / Self Care | Payer: Medicare Other | Source: Ambulatory Visit | Attending: Internal Medicine | Admitting: Internal Medicine

## 2018-11-01 DIAGNOSIS — Z1231 Encounter for screening mammogram for malignant neoplasm of breast: Secondary | ICD-10-CM | POA: Insufficient documentation

## 2019-10-02 ENCOUNTER — Other Ambulatory Visit: Payer: Self-pay | Admitting: Obstetrics & Gynecology

## 2019-10-02 DIAGNOSIS — Z1231 Encounter for screening mammogram for malignant neoplasm of breast: Secondary | ICD-10-CM

## 2019-11-07 ENCOUNTER — Ambulatory Visit
Admission: RE | Admit: 2019-11-07 | Discharge: 2019-11-07 | Disposition: A | Discharge status: Home / Self Care | Payer: Medicare Other | Source: Ambulatory Visit | Attending: Obstetrics & Gynecology | Admitting: Obstetrics & Gynecology

## 2019-11-07 ENCOUNTER — Other Ambulatory Visit: Payer: Self-pay

## 2019-11-07 DIAGNOSIS — Z1231 Encounter for screening mammogram for malignant neoplasm of breast: Secondary | ICD-10-CM | POA: Insufficient documentation

## 2020-09-30 ENCOUNTER — Other Ambulatory Visit: Payer: Self-pay | Admitting: Internal Medicine

## 2020-09-30 DIAGNOSIS — Z1231 Encounter for screening mammogram for malignant neoplasm of breast: Secondary | ICD-10-CM

## 2020-11-07 ENCOUNTER — Ambulatory Visit
Admission: RE | Admit: 2020-11-07 | Discharge: 2020-11-07 | Disposition: A | Discharge status: Home / Self Care | Payer: Medicare Other | Source: Ambulatory Visit | Attending: Internal Medicine | Admitting: Internal Medicine

## 2020-11-07 ENCOUNTER — Other Ambulatory Visit: Payer: Self-pay

## 2020-11-07 ENCOUNTER — Other Ambulatory Visit: Payer: Self-pay | Admitting: Internal Medicine

## 2020-11-07 DIAGNOSIS — Z1231 Encounter for screening mammogram for malignant neoplasm of breast: Secondary | ICD-10-CM

## 2021-10-22 ENCOUNTER — Other Ambulatory Visit: Payer: Self-pay | Admitting: Internal Medicine

## 2021-10-22 DIAGNOSIS — Z1231 Encounter for screening mammogram for malignant neoplasm of breast: Secondary | ICD-10-CM

## 2021-12-05 DEATH — deceased
